# Patient Record
Sex: Female | Born: 1990 | Race: White | Hispanic: No | Marital: Single | Smoking: Current every day smoker
Health system: Southern US, Community
[De-identification: ages and names within clinical notes are randomized; demographics above are authoritative.]

## PROBLEM LIST (undated history)

## (undated) DIAGNOSIS — T8859XA Other complications of anesthesia, initial encounter: Secondary | ICD-10-CM

## (undated) DIAGNOSIS — F32A Depression, unspecified: Secondary | ICD-10-CM

## (undated) DIAGNOSIS — M5417 Radiculopathy, lumbosacral region: Secondary | ICD-10-CM

## (undated) DIAGNOSIS — N189 Chronic kidney disease, unspecified: Secondary | ICD-10-CM

## (undated) DIAGNOSIS — R569 Unspecified convulsions: Secondary | ICD-10-CM

## (undated) DIAGNOSIS — G43909 Migraine, unspecified, not intractable, without status migrainosus: Secondary | ICD-10-CM

## (undated) DIAGNOSIS — J189 Pneumonia, unspecified organism: Secondary | ICD-10-CM

## (undated) DIAGNOSIS — M5126 Other intervertebral disc displacement, lumbar region: Secondary | ICD-10-CM

## (undated) DIAGNOSIS — M797 Fibromyalgia: Secondary | ICD-10-CM

## (undated) DIAGNOSIS — I639 Cerebral infarction, unspecified: Secondary | ICD-10-CM

## (undated) DIAGNOSIS — M1990 Unspecified osteoarthritis, unspecified site: Secondary | ICD-10-CM

## (undated) DIAGNOSIS — F419 Anxiety disorder, unspecified: Secondary | ICD-10-CM

## (undated) DIAGNOSIS — J4540 Moderate persistent asthma, uncomplicated: Secondary | ICD-10-CM

## (undated) DIAGNOSIS — I10 Essential (primary) hypertension: Secondary | ICD-10-CM

## (undated) DIAGNOSIS — F4310 Post-traumatic stress disorder, unspecified: Secondary | ICD-10-CM

## (undated) DIAGNOSIS — G4730 Sleep apnea, unspecified: Secondary | ICD-10-CM

## (undated) DIAGNOSIS — K219 Gastro-esophageal reflux disease without esophagitis: Secondary | ICD-10-CM

## (undated) DIAGNOSIS — G459 Transient cerebral ischemic attack, unspecified: Secondary | ICD-10-CM

## (undated) HISTORY — PX: KIDNEY SURGERY: SHX687

## (undated) HISTORY — PX: OTHER SURGICAL HISTORY: SHX169

## (undated) HISTORY — PX: FOOT SURGERY: SHX648

## (undated) HISTORY — PX: WISDOM TOOTH EXTRACTION: SHX21

---
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
## 2010-08-08 ENCOUNTER — Emergency Department: Payer: Self-pay | Admitting: Emergency Medicine

## 2010-11-13 ENCOUNTER — Emergency Department: Payer: Self-pay | Admitting: Emergency Medicine

## 2011-09-22 DIAGNOSIS — I10 Essential (primary) hypertension: Secondary | ICD-10-CM | POA: Insufficient documentation

## 2012-08-02 ENCOUNTER — Emergency Department: Payer: Self-pay | Admitting: Emergency Medicine

## 2012-08-02 LAB — CBC
MCH: 26.3 pg (ref 26.0–34.0)
MCHC: 34.3 g/dL (ref 32.0–36.0)
MCV: 77 fL — ABNORMAL LOW (ref 80–100)
Platelet: 155 10*3/uL (ref 150–440)
RBC: 4.45 10*6/uL (ref 3.80–5.20)
RDW: 15.7 % — ABNORMAL HIGH (ref 11.5–14.5)
WBC: 3.3 10*3/uL — ABNORMAL LOW (ref 3.6–11.0)

## 2012-08-02 LAB — BASIC METABOLIC PANEL
Anion Gap: 9 (ref 7–16)
BUN: 7 mg/dL (ref 7–18)
Creatinine: 0.89 mg/dL (ref 0.60–1.30)
EGFR (African American): >60
EGFR (Non-African Amer.): >60
Glucose: 85 mg/dL (ref 65–99)

## 2012-08-02 LAB — WET PREP, GENITAL

## 2012-08-02 LAB — URINALYSIS, COMPLETE
Glucose,UR: NEGATIVE mg/dL (ref 0–75)
Ketone: NEGATIVE
Leukocyte Esterase: NEGATIVE
RBC,UR: 3 /HPF (ref 0–5)
Specific Gravity: 1.027 (ref 1.003–1.030)
Squamous Epithelial: 1

## 2012-08-02 LAB — PREGNANCY, URINE: Pregnancy Test, Urine: NEGATIVE m[IU]/mL

## 2012-08-08 LAB — CULTURE, BLOOD (SINGLE)

## 2013-05-19 ENCOUNTER — Emergency Department: Payer: Self-pay | Admitting: Emergency Medicine

## 2013-05-19 LAB — COMPREHENSIVE METABOLIC PANEL
Anion Gap: 4 — ABNORMAL LOW (ref 7–16)
BUN: 14 mg/dL (ref 7–18)
Calcium, Total: 8.8 mg/dL (ref 8.5–10.1)
Co2: 29 mmol/L (ref 21–32)
Creatinine: 0.91 mg/dL (ref 0.60–1.30)
EGFR (Non-African Amer.): >60
Osmolality: 277 (ref 275–301)
SGPT (ALT): 43 U/L (ref 12–78)
Sodium: 138 mmol/L (ref 136–145)

## 2013-05-19 LAB — CBC
HCT: 37.5 % (ref 35.0–47.0)
HGB: 12.5 g/dL (ref 12.0–16.0)
MCH: 26.8 pg (ref 26.0–34.0)
MCHC: 33.5 g/dL (ref 32.0–36.0)
Platelet: 205 10*3/uL (ref 150–440)
WBC: 7.7 10*3/uL (ref 3.6–11.0)

## 2014-01-16 ENCOUNTER — Emergency Department: Payer: Self-pay | Admitting: Emergency Medicine

## 2014-01-29 ENCOUNTER — Emergency Department: Payer: Self-pay | Admitting: Emergency Medicine

## 2014-03-14 ENCOUNTER — Emergency Department: Payer: Self-pay | Admitting: Emergency Medicine

## 2014-03-14 LAB — COMPREHENSIVE METABOLIC PANEL
ALT: 19 U/L (ref 12–78)
Albumin: 3.7 g/dL (ref 3.4–5.0)
Alkaline Phosphatase: 55 U/L
Anion Gap: 4 — ABNORMAL LOW (ref 7–16)
BUN: 11 mg/dL (ref 7–18)
Bilirubin,Total: 0.4 mg/dL (ref 0.2–1.0)
CHLORIDE: 107 mmol/L (ref 98–107)
Calcium, Total: 8.9 mg/dL (ref 8.5–10.1)
Co2: 29 mmol/L (ref 21–32)
Creatinine: 0.94 mg/dL (ref 0.60–1.30)
EGFR (Non-African Amer.): >60
Glucose: 77 mg/dL (ref 65–99)
Osmolality: 278 (ref 275–301)
POTASSIUM: 4 mmol/L (ref 3.5–5.1)
SGOT(AST): 11 U/L — ABNORMAL LOW (ref 15–37)
Sodium: 140 mmol/L (ref 136–145)
Total Protein: 7.2 g/dL (ref 6.4–8.2)

## 2014-03-14 LAB — CBC WITH DIFFERENTIAL/PLATELET
BASOS ABS: 0 10*3/uL (ref 0.0–0.1)
BASOS PCT: 0.3 %
Eosinophil #: 0.1 10*3/uL (ref 0.0–0.7)
Eosinophil %: 1.6 %
HCT: 38.3 % (ref 35.0–47.0)
HGB: 12.9 g/dL (ref 12.0–16.0)
Lymphocyte #: 2.3 10*3/uL (ref 1.0–3.6)
Lymphocyte %: 32.9 %
MCH: 29.5 pg (ref 26.0–34.0)
MCHC: 33.6 g/dL (ref 32.0–36.0)
MCV: 88 fL (ref 80–100)
MONO ABS: 0.6 x10 3/mm (ref 0.2–0.9)
MONOS PCT: 8 %
Neutrophil #: 4 10*3/uL (ref 1.4–6.5)
Neutrophil %: 57.2 %
Platelet: 188 10*3/uL (ref 150–440)
RBC: 4.36 10*6/uL (ref 3.80–5.20)
RDW: 14 % (ref 11.5–14.5)
WBC: 6.9 10*3/uL (ref 3.6–11.0)

## 2014-03-14 LAB — PROTIME-INR
INR: 0.9
PROTHROMBIN TIME: 12 s (ref 11.5–14.7)

## 2014-03-14 LAB — APTT: Activated PTT: 32.2 secs (ref 23.6–35.9)

## 2014-04-28 ENCOUNTER — Emergency Department: Payer: Self-pay | Admitting: Emergency Medicine

## 2014-04-28 LAB — URINALYSIS, COMPLETE
Bilirubin,UR: NEGATIVE
Glucose,UR: NEGATIVE mg/dL (ref 0–75)
Ketone: NEGATIVE
Nitrite: POSITIVE
Ph: 6 (ref 4.5–8.0)
Protein: NEGATIVE
RBC,UR: 5 /HPF (ref 0–5)
Specific Gravity: 1.013 (ref 1.003–1.030)
Squamous Epithelial: 6
WBC UR: 87 /HPF (ref 0–5)

## 2014-04-28 LAB — CBC WITH DIFFERENTIAL/PLATELET
Basophil #: 0 10*3/uL (ref 0.0–0.1)
Basophil %: 0.4 %
EOS ABS: 0.2 10*3/uL (ref 0.0–0.7)
EOS PCT: 3.1 %
HCT: 40.6 % (ref 35.0–47.0)
HGB: 13.8 g/dL (ref 12.0–16.0)
LYMPHS ABS: 2.1 10*3/uL (ref 1.0–3.6)
Lymphocyte %: 26.4 %
MCH: 29.5 pg (ref 26.0–34.0)
MCHC: 33.9 g/dL (ref 32.0–36.0)
MCV: 87 fL (ref 80–100)
Monocyte #: 0.5 x10 3/mm (ref 0.2–0.9)
Monocyte %: 6.4 %
NEUTROS ABS: 5 10*3/uL (ref 1.4–6.5)
NEUTROS PCT: 63.7 %
Platelet: 188 10*3/uL (ref 150–440)
RBC: 4.66 10*6/uL (ref 3.80–5.20)
RDW: 14 % (ref 11.5–14.5)
WBC: 7.9 10*3/uL (ref 3.6–11.0)

## 2014-04-28 LAB — BASIC METABOLIC PANEL
Anion Gap: 11 (ref 7–16)
BUN: 13 mg/dL (ref 7–18)
Calcium, Total: 9.3 mg/dL (ref 8.5–10.1)
Chloride: 107 mmol/L (ref 98–107)
Co2: 22 mmol/L (ref 21–32)
Creatinine: 0.84 mg/dL (ref 0.60–1.30)
EGFR (African American): >60
EGFR (Non-African Amer.): >60
Glucose: 108 mg/dL — ABNORMAL HIGH (ref 65–99)
Osmolality: 280 (ref 275–301)
Potassium: 4 mmol/L (ref 3.5–5.1)
Sodium: 140 mmol/L (ref 136–145)

## 2014-04-28 LAB — PREGNANCY, URINE: Pregnancy Test, Urine: NEGATIVE m[IU]/mL

## 2014-04-30 LAB — URINE CULTURE

## 2014-05-03 ENCOUNTER — Emergency Department: Payer: Self-pay | Admitting: Emergency Medicine

## 2014-05-03 LAB — COMPREHENSIVE METABOLIC PANEL
ALBUMIN: 3.7 g/dL (ref 3.4–5.0)
ALK PHOS: 46 U/L
Anion Gap: 5 — ABNORMAL LOW (ref 7–16)
BILIRUBIN TOTAL: 0.3 mg/dL (ref 0.2–1.0)
BUN: 11 mg/dL (ref 7–18)
CHLORIDE: 110 mmol/L — AB (ref 98–107)
CO2: 24 mmol/L (ref 21–32)
CREATININE: 1.11 mg/dL (ref 0.60–1.30)
Calcium, Total: 8.4 mg/dL — ABNORMAL LOW (ref 8.5–10.1)
Glucose: 104 mg/dL — ABNORMAL HIGH (ref 65–99)
OSMOLALITY: 277 (ref 275–301)
POTASSIUM: 4.3 mmol/L (ref 3.5–5.1)
SGOT(AST): 25 U/L (ref 15–37)
SGPT (ALT): 21 U/L (ref 12–78)
SODIUM: 139 mmol/L (ref 136–145)
TOTAL PROTEIN: 7.2 g/dL (ref 6.4–8.2)

## 2014-05-03 LAB — URINALYSIS, COMPLETE
BACTERIA: NONE SEEN
BILIRUBIN, UR: NEGATIVE
BLOOD: NEGATIVE
GLUCOSE, UR: NEGATIVE mg/dL (ref 0–75)
KETONE: NEGATIVE
LEUKOCYTE ESTERASE: NEGATIVE
NITRITE: NEGATIVE
PH: 6 (ref 4.5–8.0)
Protein: NEGATIVE
SPECIFIC GRAVITY: 1.02 (ref 1.003–1.030)
Squamous Epithelial: 1
WBC UR: 1 /HPF (ref 0–5)

## 2014-05-03 LAB — CBC
HCT: 38.5 % (ref 35.0–47.0)
HGB: 12.9 g/dL (ref 12.0–16.0)
MCH: 29.3 pg (ref 26.0–34.0)
MCHC: 33.6 g/dL (ref 32.0–36.0)
MCV: 87 fL (ref 80–100)
PLATELETS: 187 10*3/uL (ref 150–440)
RBC: 4.41 10*6/uL (ref 3.80–5.20)
RDW: 13.5 % (ref 11.5–14.5)
WBC: 5.1 10*3/uL (ref 3.6–11.0)

## 2014-05-03 LAB — PREGNANCY, URINE: PREGNANCY TEST, URINE: NEGATIVE m[IU]/mL

## 2014-05-05 LAB — URINE CULTURE

## 2014-07-16 ENCOUNTER — Emergency Department: Payer: Self-pay | Admitting: Emergency Medicine

## 2014-09-20 ENCOUNTER — Emergency Department: Payer: Self-pay | Admitting: Emergency Medicine

## 2014-09-22 ENCOUNTER — Emergency Department: Payer: Self-pay | Admitting: Emergency Medicine

## 2014-10-18 ENCOUNTER — Emergency Department: Payer: Self-pay | Admitting: Emergency Medicine

## 2014-12-23 ENCOUNTER — Emergency Department: Payer: Self-pay | Admitting: Emergency Medicine

## 2015-02-24 ENCOUNTER — Emergency Department: Payer: Self-pay | Admitting: Emergency Medicine

## 2015-06-13 IMAGING — CT CT STONE STUDY
2 of 4 series · 16 of 46 positions shown, 18 images · non-contrast
Comparison: US PELV - US TRANSVAGINAL dated 08/02/2012; CT STONE
STUDY dated 08/02/2012

CLINICAL DATA: One week ago of dysuria history of right nephrectomy
at age 2 years ; frequent urinary tract infections now with
worsening left lower back pain

EXAM:
CT ABDOMEN AND PELVIS WITHOUT CONTRAST
TECHNIQUE: Multidetector CT imaging of the abdomen and pelvis was performed
following the standard protocol without IV contrast.

[Series 2: stone standard full · axial · 0.90mm/px · z∈[-379,+91]mm · 13 of 104 slices shown, 15 images]
[im 5/104  soft-tissue]
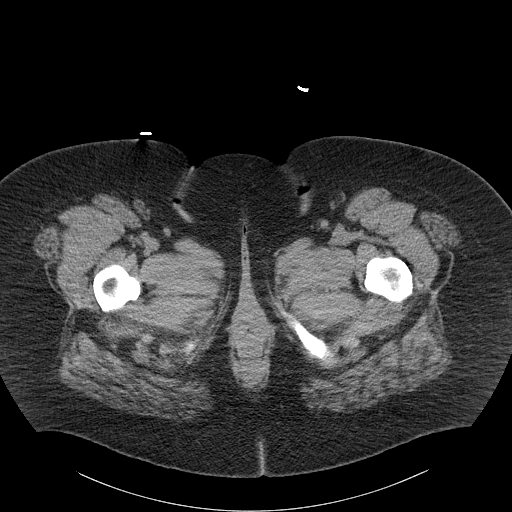
[im 5/104  bone]
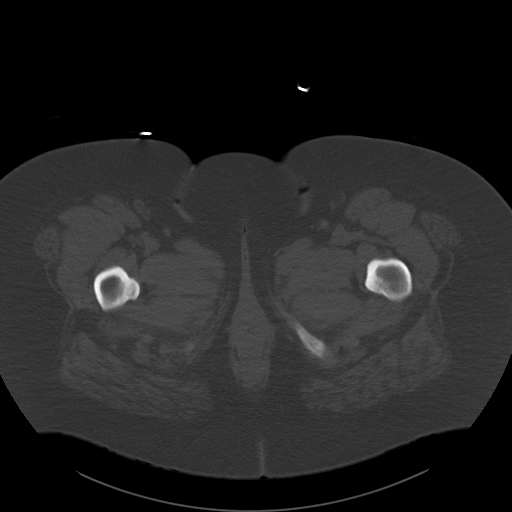
[im 13/104  soft-tissue]
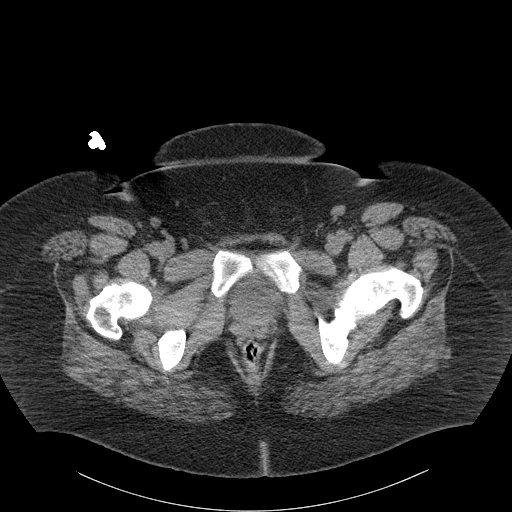
[im 22/104  soft-tissue]
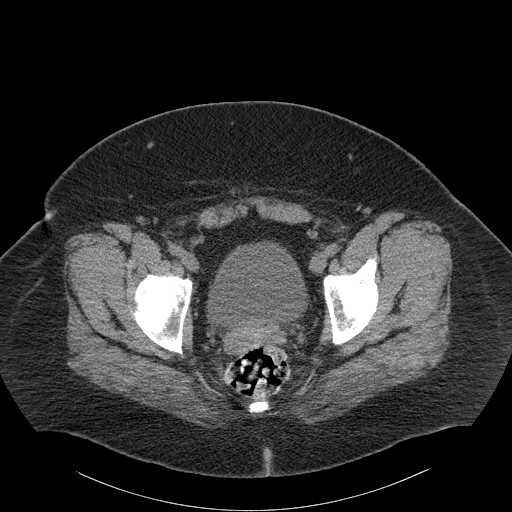
[im 31/104  soft-tissue]
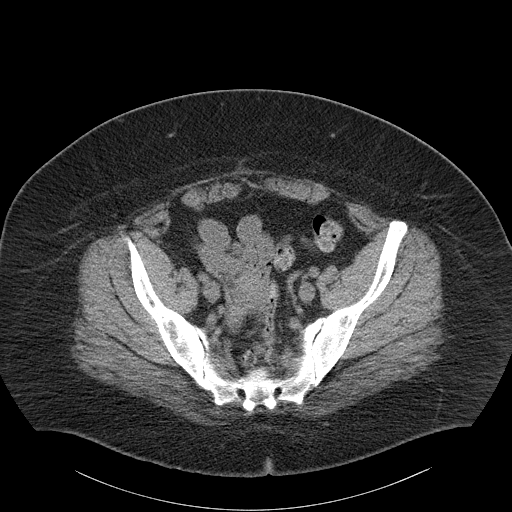
[im 35/104  soft-tissue]
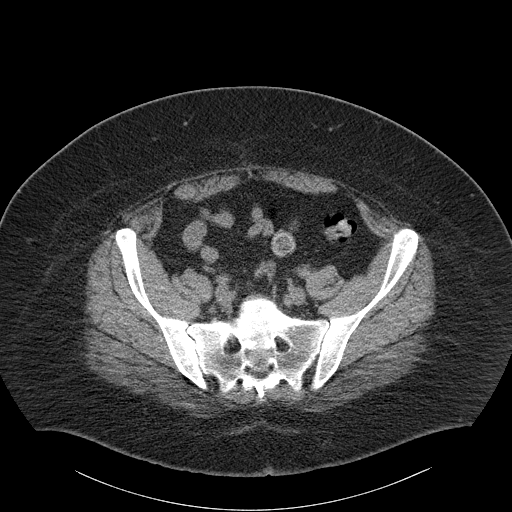
[im 43/104  soft-tissue]
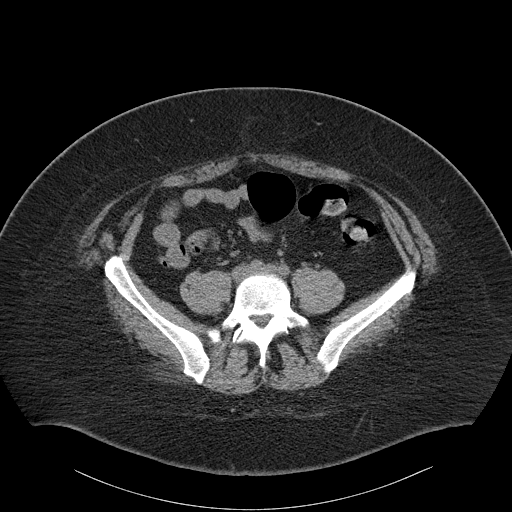
[im 52/104  soft-tissue]
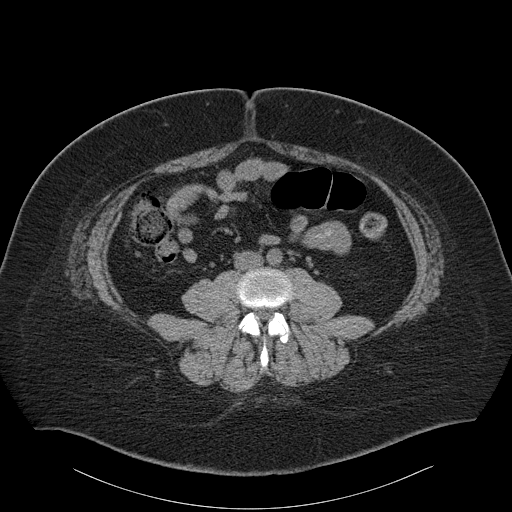
[im 61/104  soft-tissue]
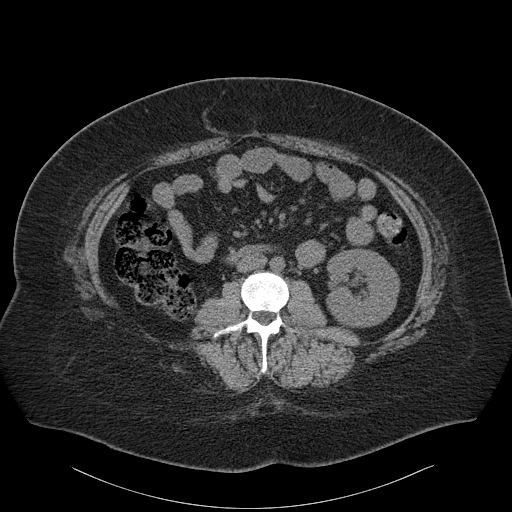
[im 69/104  soft-tissue]
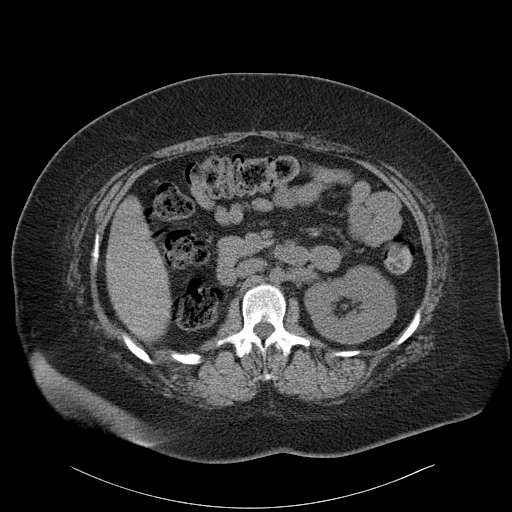
[im 69/104  bone]
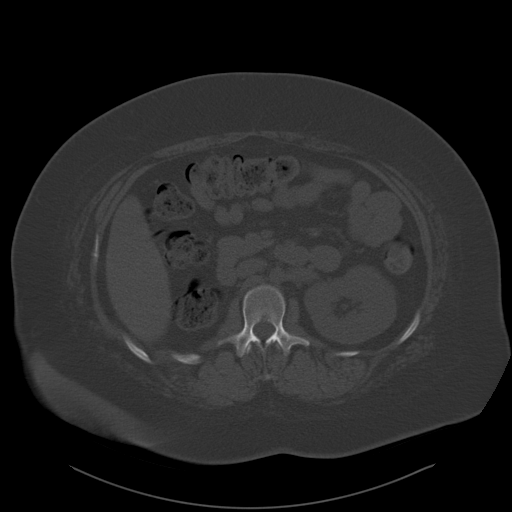
[im 73/104  soft-tissue]
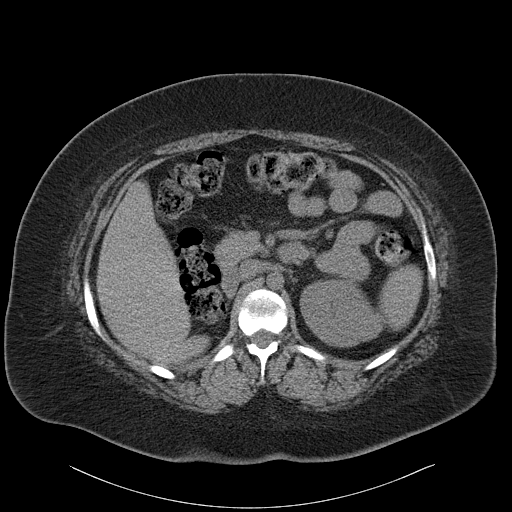
[im 82/104  soft-tissue]
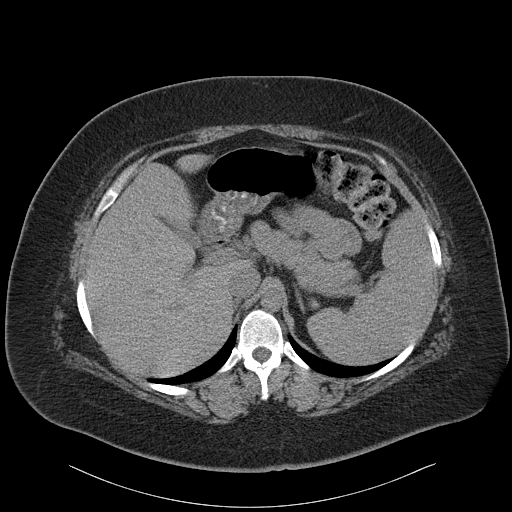
[im 91/104  soft-tissue]
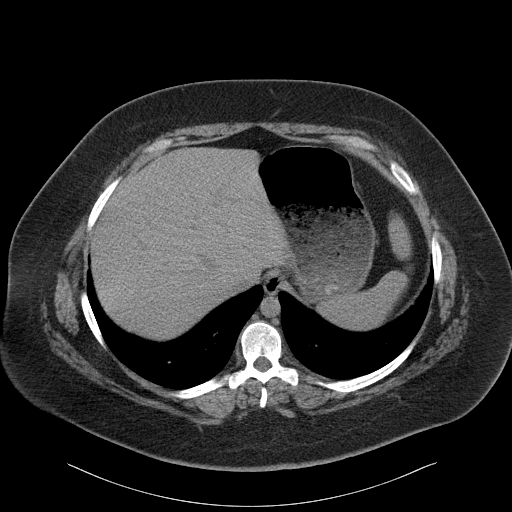
[im 99/104  soft-tissue]
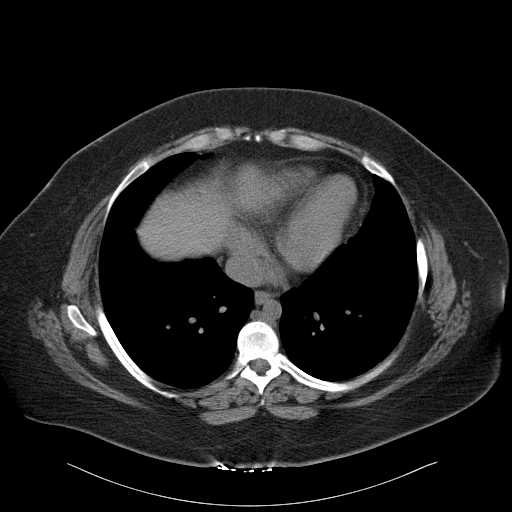

[Series 5: cor stone standard full · coronal · 0.94mm/px · 3 of 163 slices shown]
[im 55/163  soft-tissue]
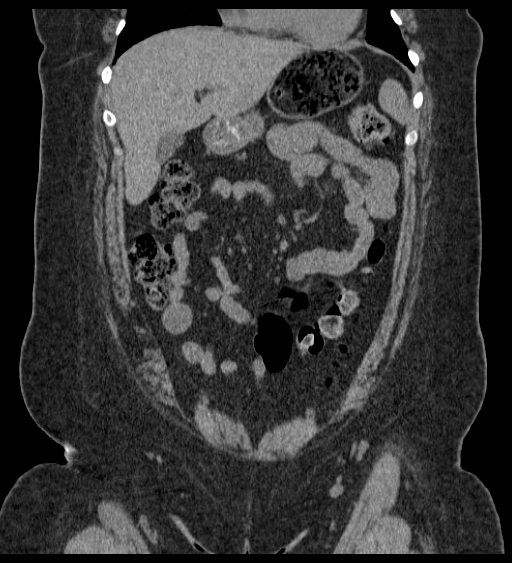
[im 73/163  soft-tissue]
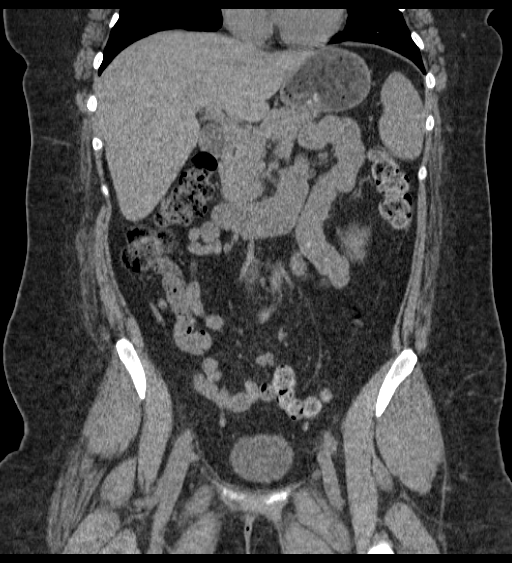
[im 91/163  soft-tissue]
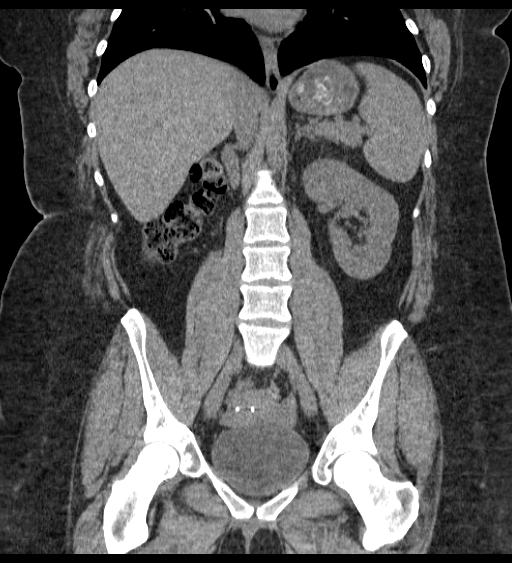

[16 of 46 positions shown; findings below may reference images not displayed]

FINDINGS: The right kidney is surgically absent. The left kidney is Normal in
density and contour. There is no hydronephrosis. There are no
calcified stones. The perinephric fat is normal in density. The left
ureter is normal in course and caliber. There is a stable phlebolith
near the urinary bladder base on the left on image 87 of series 2.
No bladder stone or urethral stone is demonstrated.

The liver, gallbladder, pancreas, and adrenal glands are normal in
appearance. The spleen has decreased in size since the previous
study with maximal AP dimension measured now of 14 cm while
previously it was approximately 16.7 cm. The caliber of the
abdominal aorta is normal.

The stomach is partially distended with food. The small and large
bowel exhibit no evidence of ileus nor obstruction. A normal
calibered uninflamed appearing appendix is demonstrated. Within the
pelvis an IUD is present within the uterus. There is no adnexal mass
or free pelvic fluid. There are shallow bilateral inguinal hernias
containing fat. There is no significant umbilical hernia

The psoas musculature is normal in appearance. These lumbar spine
and bony pelvis exhibit no acute abnormalities. There is bulging
disc material posteriorly at L5-S1. The lung bases are clear.
IMPRESSION: 1. There is no evidence of obstruction or other acute abnormality of
the solitary left kidney.
2. There is no acute bowel abnormality nor acute hepatobiliary
abnormality.
3. There is an IUD present. No acute uterine or adnexal abnormality
is demonstrated.
4. There is no evidence of ascites or intra-abdominal or pelvic
lymphadenopathy or inflammatory masses.

## 2015-08-29 ENCOUNTER — Other Ambulatory Visit: Payer: Self-pay

## 2015-08-29 ENCOUNTER — Emergency Department: Payer: Medicaid Other

## 2015-08-29 ENCOUNTER — Emergency Department
Admission: EM | Admit: 2015-08-29 | Discharge: 2015-08-29 | Disposition: A | Discharge status: Home / Self Care | Payer: Medicaid Other | Attending: Emergency Medicine | Admitting: Emergency Medicine

## 2015-08-29 ENCOUNTER — Encounter: Payer: Self-pay | Admitting: Emergency Medicine

## 2015-08-29 DIAGNOSIS — J189 Pneumonia, unspecified organism: Secondary | ICD-10-CM | POA: Diagnosis not present

## 2015-08-29 DIAGNOSIS — I10 Essential (primary) hypertension: Secondary | ICD-10-CM | POA: Insufficient documentation

## 2015-08-29 DIAGNOSIS — J069 Acute upper respiratory infection, unspecified: Secondary | ICD-10-CM | POA: Insufficient documentation

## 2015-08-29 DIAGNOSIS — Z79899 Other long term (current) drug therapy: Secondary | ICD-10-CM | POA: Diagnosis not present

## 2015-08-29 DIAGNOSIS — R042 Hemoptysis: Secondary | ICD-10-CM | POA: Diagnosis present

## 2015-08-29 HISTORY — DX: Essential (primary) hypertension: I10

## 2015-08-29 MED ORDER — ALBUTEROL SULFATE HFA 108 (90 BASE) MCG/ACT IN AERS: INHALATION_SPRAY | RESPIRATORY_TRACT | Status: DC

## 2015-08-29 MED ORDER — AZITHROMYCIN 250 MG PO TABS: ORAL_TABLET | ORAL | Status: DC

## 2015-08-29 MED ORDER — BENZONATATE 100 MG PO CAPS
200.0000 mg | ORAL_CAPSULE | Freq: Once | ORAL | Status: AC
  Administered 2015-08-29: 200 mg via ORAL

## 2015-08-29 MED ORDER — AZITHROMYCIN 250 MG PO TABS
ORAL_TABLET | ORAL | Status: AC
  Filled 2015-08-29: qty 4

## 2015-08-29 MED ORDER — AZITHROMYCIN 250 MG PO TABS
500.0000 mg | ORAL_TABLET | Freq: Once | ORAL | Status: AC
  Administered 2015-08-29: 500 mg via ORAL

## 2015-08-29 MED ORDER — BENZONATATE 100 MG PO CAPS
ORAL_CAPSULE | ORAL | Status: AC
  Filled 2015-08-29: qty 1

## 2015-08-29 MED ORDER — BENZONATATE 100 MG PO CAPS
100.0000 mg | ORAL_CAPSULE | Freq: Four times a day (QID) | ORAL | Status: AC | PRN
Start: 2015-08-29 — End: 2016-08-28

## 2015-08-29 NOTE — Discharge Instructions (Signed)
We believe that your symptoms are caused today by either a viral infection (a "cold"), an atypical pneumonia, or both.  Fortunately you should start to improve quickly with time and with the medications prescribed.  Please take the full course of antibiotics as prescribed and drink plenty of fluids.  Use the other prescribed medications as written and as needed.  Follow up with your doctor within 1-2 days.  If you develop any new or worsening symptoms, including but not limited to fever in spite of taking over-the-counter ibuprofen and/or Tylenol, persistent vomiting, worsening shortness of breath, or other symptoms that concern you, please return to the Emergency Department immediately.    Pneumonia, Adult Pneumonia is an infection of the lungs. It may be caused by a germ (virus or bacteria). Some types of pneumonia can spread easily from person to person. This can happen when you cough or sneeze. HOME CARE  Only take medicine as told by your doctor.  Take your medicine (antibiotics) as told. Finish it even if you start to feel better.  Do not smoke.  You may use a vaporizer or humidifier in your room. This can help loosen thick spit (mucus).  Sleep so you are almost sitting up (semi-upright). This helps reduce coughing.  Rest. A shot (vaccine) can help prevent pneumonia. Shots are often advised for:  People over 38 years old.  Patients on chemotherapy.  People with long-term (chronic) lung problems.  People with immune system problems. GET HELP RIGHT AWAY IF:   You are getting worse.  You cannot control your cough, and you are losing sleep.  You cough up blood.  Your pain gets worse, even with medicine.  You have a fever.  Any of your problems are getting worse, not better.  You have shortness of breath or chest pain. MAKE SURE YOU:   Understand these instructions.  Will watch your condition.  Will get help right away if you are not doing well or get  worse. Document Released: 06/03/2008 Document Revised: 03/09/2012 Document Reviewed: 03/08/2011 [facility] Patient Information 2015 [place], [place]. This information is not intended to replace advice given to you by your health care provider. Make sure you discuss any questions you have with your health care provider.  Upper Respiratory Infection, Adult An upper respiratory infection (URI) is also sometimes known as the common cold. The upper respiratory tract includes the nose, sinuses, throat, trachea, and bronchi. Bronchi are the airways leading to the lungs. Most people improve within 1 week, but symptoms can last up to 2 weeks. A residual cough may last even longer.  CAUSES Many different viruses can infect the tissues lining the upper respiratory tract. The tissues become irritated and inflamed and often become very moist. Mucus production is also common. A cold is contagious. You can easily spread the virus to others by oral contact. This includes kissing, sharing a glass, coughing, or sneezing. Touching your mouth or nose and then touching a surface, which is then touched by another person, can also spread the virus. SYMPTOMS  Symptoms typically develop 1 to 3 days after you come in contact with a cold virus. Symptoms vary from person to person. They may include:  Runny nose.  Sneezing.  Nasal congestion.  Sinus irritation.  Sore throat.  Loss of voice (laryngitis).  Cough.  Fatigue.  Muscle aches.  Loss of appetite.  Headache.  Low-grade fever. DIAGNOSIS  You might diagnose your own cold based on familiar symptoms, since most people get a cold 2  to 3 times a year. Your caregiver can confirm this based on your exam. Most importantly, your caregiver can check that your symptoms are not due to another disease such as strep throat, sinusitis, pneumonia, asthma, or epiglottitis. Blood tests, throat tests, and X-rays are not necessary to diagnose a common cold, but they may  sometimes be helpful in excluding other more serious diseases. Your caregiver will decide if any further tests are required. RISKS AND COMPLICATIONS  You may be at risk for a more severe case of the common cold if you smoke cigarettes, have chronic heart disease (such as heart failure) or lung disease (such as asthma), or if you have a weakened immune system. The very young and very old are also at risk for more serious infections. Bacterial sinusitis, middle ear infections, and bacterial pneumonia can complicate the common cold. The common cold can worsen asthma and chronic obstructive pulmonary disease (COPD). Sometimes, these complications can require emergency medical care and may be life-threatening. PREVENTION  The best way to protect against getting a cold is to practice good hygiene. Avoid oral or hand contact with people with cold symptoms. Wash your hands often if contact occurs. There is no clear evidence that vitamin C, vitamin E, echinacea, or exercise reduces the chance of developing a cold. However, it is always recommended to get plenty of rest and practice good nutrition. TREATMENT  Treatment is directed at relieving symptoms. There is no cure. Antibiotics are not effective, because the infection is caused by a virus, not by bacteria. Treatment may include:  Increased fluid intake. Sports drinks offer valuable electrolytes, sugars, and fluids.  Breathing heated mist or steam (vaporizer or shower).  Eating chicken soup or other clear broths, and maintaining good nutrition.  Getting plenty of rest.  Using gargles or lozenges for comfort.  Controlling fevers with ibuprofen or acetaminophen as directed by your caregiver.  Increasing usage of your inhaler if you have asthma. Zinc gel and zinc lozenges, taken in the first 24 hours of the common cold, can shorten the duration and lessen the severity of symptoms. Pain medicines may help with fever, muscle aches, and throat pain. A  variety of non-prescription medicines are available to treat congestion and runny nose. Your caregiver can make recommendations and may suggest nasal or lung inhalers for other symptoms.  HOME CARE INSTRUCTIONS   Only take over-the-counter or prescription medicines for pain, discomfort, or fever as directed by your caregiver.  Use a warm mist humidifier or inhale steam from a shower to increase air moisture. This may keep secretions moist and make it easier to breathe.  Drink enough water and fluids to keep your urine clear or pale yellow.  Rest as needed.  Return to work when your temperature has returned to normal or as your caregiver advises. You may need to stay home longer to avoid infecting others. You can also use a face mask and careful hand washing to prevent spread of the virus. SEEK MEDICAL CARE IF:   After the first few days, you feel you are getting worse rather than better.  You need your caregiver's advice about medicines to control symptoms.  You develop chills, worsening shortness of breath, or brown or red sputum. These may be signs of pneumonia.  You develop yellow or brown nasal discharge or pain in the face, especially when you bend forward. These may be signs of sinusitis.  You develop a fever, swollen neck glands, pain with swallowing, or white areas in the  back of your throat. These may be signs of strep throat. SEEK IMMEDIATE MEDICAL CARE IF:   You have a fever.  You develop severe or persistent headache, ear pain, sinus pain, or chest pain.  You develop wheezing, a prolonged cough, cough up blood, or have a change in your usual mucus (if you have chronic lung disease).  You develop sore muscles or a stiff neck. Document Released: 06/11/2001 Document Revised: 03/09/2012 Document Reviewed: 03/23/2014 [facility] Patient Information 2015 [place], [place]. This information is not intended to replace advice given to you by your health care provider. Make sure you  discuss any questions you have with your health care provider.

## 2015-08-29 NOTE — ED Provider Notes (Signed)
[facility] Emergency Department Provider Note  ____________________________________________  Time seen: Approximately 8:37 PM  I have reviewed the triage vital signs and the nursing notes.   HISTORY  Chief Complaint Hemoptysis    HPI Karen Hunt is a 24 y.o. female with a history of obesity and high blood pressure but no other significant medical problems who presents with about 3 days of upper respiratory symptoms including a sore throat, mild shortness of breath, frequent cough, and nasal congestion.  She has not felt any fever or chills, but the cough has been the worst of the symptoms.  She denies any wheezing or difficulty catching her breath but the shortness of breath she reports is mostly from the cough.  Today she was coughing hard enough that she noticed "flecks or blobs" of blood when she coughed.  Otherwise she notes that the cough is mostly just productive of "mostly clear" sputum.    She denies chest pain, abdominal pain, nausea/vomiting, dysuria.  Her voice has been softer than usual, but her mother who is at the bedside notes that whenever she gets a cold she loses her voice "at the drop of a hat".   Past Medical History  Diagnosis Date  . Hypertension     There are no active problems to display for this patient.   Past Surgical History  Procedure Laterality Date  . Kidney surgery    . Foot surgery Left   . C section      Current Outpatient Rx  Name  Route  Sig  Dispense  Refill  . lisinopril (PRINIVIL,ZESTRIL) 20 MG tablet   Oral   Take 20 mg by mouth daily.         Marland Kitchen albuterol (PROVENTIL HFA;VENTOLIN HFA) 108 (90 BASE) MCG/ACT inhaler      Inhale 4-6 puffs by mouth every 4 hours as needed for wheezing, cough, and/or shortness of breath   1 Inhaler   1   . azithromycin (ZITHROMAX) 250 MG tablet      Take 1 tablet PO daily for 4 more days starting tomorrow (08/30/2015)   4 tablet   0   . benzonatate (TESSALON PERLES)  100 MG capsule   Oral   Take 1 capsule (100 mg total) by mouth every 6 (six) hours as needed for cough.   30 capsule   0     Allergies Ciprofloxacin  History reviewed. No pertinent family history.  Social History Social History  Substance Use Topics  . Smoking status: Never Smoker   . Smokeless tobacco: None  . Alcohol Use: Yes    Review of Systems Constitutional: No fever/chills.  No myalgias, just generally not feeling well. Eyes: No visual changes. ENT: Mild sore throat Cardiovascular: Denies chest pain. Respiratory: Mild shortness of breath associated with a frequent cough and a small amount of hemoptysis. Gastrointestinal: No abdominal pain.  No nausea, no vomiting.  No diarrhea.  No constipation. Genitourinary: Negative for dysuria. Musculoskeletal: Negative for back pain. Skin: Negative for rash. Neurological: Negative for headaches, focal weakness or numbness.  10-point ROS otherwise negative.  ____________________________________________   PHYSICAL EXAM:  VITAL SIGNS: ED Triage Vitals  Enc Vitals Group     BP 08/29/15 1841 175/88 mmHg     Pulse Rate 08/29/15 1841 93     Resp 08/29/15 1841 22     Temp 08/29/15 1841 98.2 F (36.8 C)     Temp Source 08/29/15 1841 Oral     SpO2 --  Weight 08/29/15 1841 330 lb (149.687 kg)     Height 08/29/15 1841 5\' 11"  (1.803 m)     Head Cir --      Peak Flow --      Pain Score 08/29/15 1841 8     Pain Loc --      Pain Edu? --      Excl. in GC? --     Constitutional: Alert and oriented.  Nontoxic appearance but does appear uncomfortable. Eyes: Conjunctivae are normal. PERRL. EOMI. Head: Atraumatic. Nose: Mild congestion without rhinorrhea or epistaxis Mouth/Throat: Mucous membranes are moist.  Oropharynx non-erythematous. Neck: No stridor.  No tenderness to palpation of the larynx Cardiovascular: Normal rate, regular rhythm. Grossly normal heart sounds.  Good peripheral circulation.  Good capillary  refill. Respiratory: Mild tachypnea.  No retractions.  No accessory muscle usage.  Lungs CTAB without any wheezing, rales, or rhonchi.   Gastrointestinal: Obese, Soft and nontender. No distention. No abdominal bruits. No CVA tenderness. Musculoskeletal: No lower extremity tenderness nor edema.  No joint effusions. Neurologic:  Normal speech and language. No gross focal neurologic deficits are appreciated.  Skin:  Skin is warm, dry and intact. No rash noted. Psychiatric: Mood and affect are normal. Speech and behavior are normal.  ____________________________________________   LABS (all labs ordered are listed, but only abnormal results are displayed)  Did not obtain ____________________________________________  EKG  ED ECG REPORT I, Unique Searfoss, the attending physician, personally viewed and interpreted this ECG.  Date: 08/29/2015 EKG Time: 18:49 Rate: 79 Rhythm: normal sinus rhythm QRS Axis: normal Intervals: normal ST/T Wave abnormalities: normal Conduction Disutrbances: none Narrative Interpretation: unremarkable  ____________________________________________  RADIOLOGY   Dg Chest 2 View  08/29/2015   CLINICAL DATA:  Sore throat, shortness breath, coughing congestion for 2 days. Coughed up blood today. History of hypertension. Initial encounter.  EXAM: CHEST  2 VIEW  COMPARISON:  Abdominal CT 04/28/2014. No prior chest radiographs available.  FINDINGS: The heart size and mediastinal contours are normal. The lungs are clear. There is no pleural effusion or pneumothorax. No acute osseous findings are identified.  IMPRESSION: No active cardiopulmonary process.   Electronically Signed   By: Carey Bullocks M.D.   On: 08/29/2015 19:25    ____________________________________________   PROCEDURES  Procedure(s) performed: None  Critical Care performed: No ____________________________________________   INITIAL IMPRESSION / ASSESSMENT AND PLAN / ED COURSE  Pertinent  labs & imaging results that were available during my care of the patient were reviewed by me and considered in my medical decision making (see chart for details).  The patient has a well score for PE of 1 due to her hemoptysis.  I believe that, given all of her infectious symptoms, is much more likely that she is suffering from a viral illness and/or atypical pneumonia that is not showing up on her chest x-ray.  Her vital signs are stable other than very mild tachypnea and she has a normal oxygen saturation.  I discussed with her and her mom whether or not we should obtain labs, but I explained that I anticipated that she would have a elevated white blood cell count but that that would not tell us very much.  They understand and agree that lab work would not provide much additional information to the clinical picture.  We agreed that I would treat empirically with azithromycin and provided albuterol to help with her cough and for generally opening up her airways.  I also advised her to follow up  very closely with her outpatient doctor or to return immediately to the emergency department if she feels like she is getting worse.  She and her mother both understand and agree with the plan. ____________________________________________  FINAL CLINICAL IMPRESSION(S) / ED DIAGNOSES  Final diagnoses:  Atypical pneumonia  Upper respiratory infection      NEW MEDICATIONS STARTED DURING THIS VISIT:  New Prescriptions   ALBUTEROL (PROVENTIL HFA;VENTOLIN HFA) 108 (90 BASE) MCG/ACT INHALER    Inhale 4-6 puffs by mouth every 4 hours as needed for wheezing, cough, and/or shortness of breath   AZITHROMYCIN (ZITHROMAX) 250 MG TABLET    Take 1 tablet PO daily for 4 more days starting tomorrow (08/30/2015)   BENZONATATE (TESSALON PERLES) 100 MG CAPSULE    Take 1 capsule (100 mg total) by mouth every 6 (six) hours as needed for cough.     Loleta Rose, MD 08/29/15 2113

## 2015-08-29 NOTE — ED Notes (Signed)
Pt to ed with c/o sore throat, sob, cough and congestion since Sunday.  Denies fever, pt states today coughed up blood. sats at triage on RA 99%

## 2015-10-03 DIAGNOSIS — H66004 Acute suppurative otitis media without spontaneous rupture of ear drum, recurrent, right ear: Secondary | ICD-10-CM | POA: Insufficient documentation

## 2016-02-08 ENCOUNTER — Emergency Department
Admission: EM | Admit: 2016-02-08 | Discharge: 2016-02-08 | Disposition: A | Discharge status: Home / Self Care | Payer: Medicaid Other | Attending: Emergency Medicine | Admitting: Emergency Medicine

## 2016-02-08 ENCOUNTER — Emergency Department: Payer: Medicaid Other

## 2016-02-08 ENCOUNTER — Encounter: Payer: Self-pay | Admitting: *Deleted

## 2016-02-08 DIAGNOSIS — K219 Gastro-esophageal reflux disease without esophagitis: Secondary | ICD-10-CM | POA: Diagnosis not present

## 2016-02-08 DIAGNOSIS — I10 Essential (primary) hypertension: Secondary | ICD-10-CM | POA: Diagnosis not present

## 2016-02-08 DIAGNOSIS — Z3202 Encounter for pregnancy test, result negative: Secondary | ICD-10-CM | POA: Insufficient documentation

## 2016-02-08 DIAGNOSIS — R1013 Epigastric pain: Secondary | ICD-10-CM | POA: Diagnosis present

## 2016-02-08 LAB — COMPREHENSIVE METABOLIC PANEL
ALBUMIN: 4.4 g/dL (ref 3.5–5.0)
ALK PHOS: 37 U/L — AB (ref 38–126)
ALT: 20 U/L (ref 14–54)
AST: 18 U/L (ref 15–41)
Anion gap: 7 (ref 5–15)
BILIRUBIN TOTAL: 0.6 mg/dL (ref 0.3–1.2)
BUN: 13 mg/dL (ref 6–20)
CALCIUM: 9.5 mg/dL (ref 8.9–10.3)
CO2: 22 mmol/L (ref 22–32)
CREATININE: 0.97 mg/dL (ref 0.44–1.00)
Chloride: 109 mmol/L (ref 101–111)
GFR calc Af Amer: >60 mL/min (ref >60)
GFR calc non Af Amer: >60 mL/min (ref >60)
GLUCOSE: 96 mg/dL (ref 65–99)
Potassium: 4.1 mmol/L (ref 3.5–5.1)
Sodium: 138 mmol/L (ref 135–145)
TOTAL PROTEIN: 7.7 g/dL (ref 6.5–8.1)

## 2016-02-08 LAB — URINALYSIS COMPLETE WITH MICROSCOPIC (ARMC ONLY)
BACTERIA UA: NONE SEEN
Bilirubin Urine: NEGATIVE
Glucose, UA: NEGATIVE mg/dL
Hgb urine dipstick: NEGATIVE
KETONES UR: NEGATIVE mg/dL
Leukocytes, UA: NEGATIVE
NITRITE: NEGATIVE
PROTEIN: NEGATIVE mg/dL
Specific Gravity, Urine: 1.025 (ref 1.005–1.030)
pH: 5 (ref 5.0–8.0)

## 2016-02-08 LAB — CBC
HEMATOCRIT: 40.2 % (ref 35.0–47.0)
Hemoglobin: 13.7 g/dL (ref 12.0–16.0)
MCH: 28.5 pg (ref 26.0–34.0)
MCHC: 33.9 g/dL (ref 32.0–36.0)
MCV: 84.1 fL (ref 80.0–100.0)
PLATELETS: 209 10*3/uL (ref 150–440)
RBC: 4.79 MIL/uL (ref 3.80–5.20)
RDW: 13.7 % (ref 11.5–14.5)
WBC: 6.2 10*3/uL (ref 3.6–11.0)

## 2016-02-08 LAB — LIPASE, BLOOD: Lipase: 20 U/L (ref 11–51)

## 2016-02-08 LAB — PREGNANCY, URINE: PREG TEST UR: NEGATIVE

## 2016-02-08 MED ORDER — FAMOTIDINE 20 MG PO TABS
20.0000 mg | ORAL_TABLET | Freq: Once | ORAL | Status: AC
  Administered 2016-02-08: 20 mg via ORAL

## 2016-02-08 MED ORDER — OXYCODONE-ACETAMINOPHEN 5-325 MG PO TABS
2.0000 | ORAL_TABLET | Freq: Once | ORAL | Status: AC
  Administered 2016-02-08: 2 via ORAL

## 2016-02-08 MED ORDER — DIAZEPAM 5 MG PO TABS
ORAL_TABLET | ORAL | Status: AC
  Administered 2016-02-08: 5 mg via ORAL
  Filled 2016-02-08: qty 1

## 2016-02-08 MED ORDER — ONDANSETRON 4 MG PO TBDP
4.0000 mg | ORAL_TABLET | Freq: Once | ORAL | Status: AC
  Administered 2016-02-08: 4 mg via ORAL

## 2016-02-08 MED ORDER — DIAZEPAM 5 MG PO TABS
5.0000 mg | ORAL_TABLET | Freq: Once | ORAL | Status: AC
  Administered 2016-02-08: 5 mg via ORAL

## 2016-02-08 MED ORDER — GI COCKTAIL ~~LOC~~
ORAL | Status: AC
  Administered 2016-02-08: 30 mL via ORAL
  Filled 2016-02-08: qty 30

## 2016-02-08 MED ORDER — TRAMADOL HCL 50 MG PO TABS: 50.0000 mg | ORAL_TABLET | Freq: Four times a day (QID) | ORAL | Status: DC | PRN

## 2016-02-08 MED ORDER — FAMOTIDINE 20 MG PO TABS: 20.0000 mg | ORAL_TABLET | Freq: Two times a day (BID) | ORAL | Status: DC

## 2016-02-08 MED ORDER — LORAZEPAM 1 MG PO TABS: 1.0000 mg | ORAL_TABLET | Freq: Two times a day (BID) | ORAL | Status: DC

## 2016-02-08 MED ORDER — ONDANSETRON HCL 4 MG/2ML IJ SOLN: 4.0000 mg | Freq: Once | INTRAMUSCULAR | Status: DC

## 2016-02-08 MED ORDER — ONDANSETRON 4 MG PO TBDP
ORAL_TABLET | ORAL | Status: AC
  Administered 2016-02-08: 4 mg via ORAL
  Filled 2016-02-08: qty 1

## 2016-02-08 MED ORDER — FAMOTIDINE 20 MG PO TABS
ORAL_TABLET | ORAL | Status: AC
  Administered 2016-02-08: 20 mg via ORAL
  Filled 2016-02-08: qty 1

## 2016-02-08 MED ORDER — GI COCKTAIL ~~LOC~~
30.0000 mL | Freq: Once | ORAL | Status: AC
  Administered 2016-02-08: 30 mL via ORAL

## 2016-02-08 MED ORDER — OXYCODONE-ACETAMINOPHEN 5-325 MG PO TABS
ORAL_TABLET | ORAL | Status: AC
  Administered 2016-02-08: 2 via ORAL
  Filled 2016-02-08: qty 2

## 2016-02-08 MED ORDER — MORPHINE SULFATE (PF) 4 MG/ML IV SOLN: 4.0000 mg | Freq: Once | INTRAVENOUS | Status: DC

## 2016-02-08 NOTE — ED Provider Notes (Signed)
[facility] Emergency Department Provider Note     Time seen: ----------------------------------------- 9:03 PM on 02/08/2016 -----------------------------------------    I have reviewed the triage vital signs and the nursing notes.   HISTORY  Chief Complaint Abdominal Pain    HPI MALAI LADY is a 25 y.o. female who presents ER for epigastric pain this afternoon. Patient has not had this pain before, nothing makes it better or worse. Pain seems to be across the upper abdomen, no associated nausea vomiting or diarrhea. Patient is moving her bowels normally, does not have any persistent medical problems.   Past Medical History  Diagnosis Date  . Hypertension     There are no active problems to display for this patient.   Past Surgical History  Procedure Laterality Date  . Kidney surgery    . Foot surgery Left   . C section      Allergies Ciprofloxacin  Social History Social History  Substance Use Topics  . Smoking status: Never Smoker   . Smokeless tobacco: None  . Alcohol Use: Yes    Review of Systems Constitutional: Negative for fever. Eyes: Negative for visual changes. ENT: Negative for sore throat. Cardiovascular: Negative for chest pain. Respiratory: Negative for shortness of breath. Gastrointestinal: Positive for abdominal pain, negative for vomiting and diarrhea Genitourinary: Negative for dysuria. Musculoskeletal: Negative for back pain. Skin: Negative for rash. Neurological: Negative for headaches, focal weakness or numbness.  10-point ROS otherwise negative.  ____________________________________________   PHYSICAL EXAM:  VITAL SIGNS: ED Triage Vitals  Enc Vitals Group     BP 02/08/16 2007 134/79 mmHg     Pulse Rate 02/08/16 2007 73     Resp 02/08/16 2007 18     Temp 02/08/16 2007 97.8 F (36.6 C)     Temp Source 02/08/16 2007 Oral     SpO2 02/08/16 2007 98 %     Weight 02/08/16 2007 340 lb (154.223  kg)     Height 02/08/16 2007  (1.803 m)     Head Cir --      Peak Flow --      Pain Score 02/08/16 2007 9     Pain Loc --      Pain Edu? --      Excl. in GC? --     Constitutional: Alert and oriented. Mild distress Eyes: Conjunctivae are normal. PERRL. Normal extraocular movements. ENT   Head: Normocephalic and atraumatic.   Nose: No congestion/rhinnorhea.   Mouth/Throat: Mucous membranes are moist.   Neck: No stridor. Cardiovascular: Normal rate, regular rhythm. Normal and symmetric distal pulses are present in all extremities. No murmurs, rubs, or gallops. Respiratory: Normal respiratory effort without tachypnea nor retractions. Breath sounds are clear and equal bilaterally. No wheezes/rales/rhonchi. Gastrointestinal: Epigastric tenderness, no rebound or guarding. Normal bowel sounds. Musculoskeletal: Nontender with normal range of motion in all extremities. No joint effusions.  No lower extremity tenderness nor edema. Neurologic:  Normal speech and language. No gross focal neurologic deficits are appreciated. Speech is normal. No gait instability. Skin:  Skin is warm, dry and intact. No rash noted. Psychiatric: Mood and affect are normal. Speech and behavior are normal. Patient exhibits appropriate insight and judgment. ____________________________________________  ED COURSE:  Pertinent labs & imaging results that were available during my care of the patient were reviewed by me and considered in my medical decision making (see chart for details). Patient is no acute distress but does seem to be hurting particularly in the epigastrium.  I will check basic labs and x-rays. ____________________________________________    LABS (pertinent positives/negatives)  Labs Reviewed  COMPREHENSIVE METABOLIC PANEL - Abnormal; Notable for the following:    Alkaline Phosphatase 37 (*)    All other components within normal limits  URINALYSIS COMPLETEWITH MICROSCOPIC ([facility]  ONLY) - Abnormal; Notable for the following:    Color, Urine YELLOW (*)    APPearance CLEAR (*)    Squamous Epithelial / LPF 0-5 (*)    All other components within normal limits  LIPASE, BLOOD  CBC  PREGNANCY, URINE    RADIOLOGY Images were viewed by me  Abdomen 2 view Is unremarkable ____________________________________________  FINAL ASSESSMENT AND PLAN  Epigastric pain  Plan: Patient with labs and imaging as dictated above. Symptoms likely stress related which is worsening her GERD. She could also have an ulcer forming. I have started her on Pepcid, she'll continue on this and I will encourage close follow-up with her doctor for reevaluation.   Emily Filbert, MD   Emily Filbert, MD 02/08/16 [phone]

## 2016-02-08 NOTE — ED Notes (Signed)
Pt reports upper abd pain since this afternoon.  No n/v/d  Last bm was today.  No vag bleeding.  No back pain.  Pt alert.

## 2016-02-08 NOTE — ED Notes (Signed)
Xray made aware that pts urine preg was negative.

## 2016-02-08 NOTE — Discharge Instructions (Signed)

## 2016-03-28 ENCOUNTER — Emergency Department: Payer: Medicaid Other

## 2016-03-28 ENCOUNTER — Encounter: Payer: Self-pay | Admitting: Emergency Medicine

## 2016-03-28 ENCOUNTER — Emergency Department
Admission: EM | Admit: 2016-03-28 | Discharge: 2016-03-28 | Disposition: A | Discharge status: Home / Self Care | Payer: Medicaid Other | Attending: Emergency Medicine | Admitting: Emergency Medicine

## 2016-03-28 DIAGNOSIS — Z792 Long term (current) use of antibiotics: Secondary | ICD-10-CM | POA: Insufficient documentation

## 2016-03-28 DIAGNOSIS — Y929 Unspecified place or not applicable: Secondary | ICD-10-CM | POA: Insufficient documentation

## 2016-03-28 DIAGNOSIS — W2201XA Walked into wall, initial encounter: Secondary | ICD-10-CM | POA: Diagnosis not present

## 2016-03-28 DIAGNOSIS — Z79899 Other long term (current) drug therapy: Secondary | ICD-10-CM | POA: Diagnosis not present

## 2016-03-28 DIAGNOSIS — I10 Essential (primary) hypertension: Secondary | ICD-10-CM | POA: Diagnosis not present

## 2016-03-28 DIAGNOSIS — Y9389 Activity, other specified: Secondary | ICD-10-CM | POA: Insufficient documentation

## 2016-03-28 DIAGNOSIS — Y998 Other external cause status: Secondary | ICD-10-CM | POA: Insufficient documentation

## 2016-03-28 DIAGNOSIS — S60221A Contusion of right hand, initial encounter: Secondary | ICD-10-CM | POA: Diagnosis not present

## 2016-03-28 MED ORDER — TRAMADOL HCL 50 MG PO TABS: 50.0000 mg | ORAL_TABLET | Freq: Four times a day (QID) | ORAL | Status: DC | PRN

## 2016-03-28 NOTE — Discharge Instructions (Signed)
Hand Contusion  A hand contusion is a deep bruise on your hand area. Contusions are the result of an injury that caused bleeding under the skin. The contusion may turn blue, purple, or yellow. Minor injuries will give you a painless contusion, but more severe contusions may stay painful and swollen for a few weeks.  CAUSES   A contusion is usually caused by a blow, trauma, or direct force to an area of the body.  SYMPTOMS    Swelling and redness of the injured area.   Discoloration of the injured area.   Tenderness and soreness of the injured area.   Pain.  DIAGNOSIS   The diagnosis can be made by taking a history and performing a physical exam. An X-ray, CT scan, or MRI may be needed to determine if there were any associated injuries, such as broken bones (fractures).  TREATMENT   Often, the best treatment for a hand contusion is resting, elevating, icing, and applying cold compresses to the injured area. Over-the-counter medicines may also be recommended for pain control.  HOME CARE INSTRUCTIONS    Put ice on the injured area.    Put ice in a plastic bag.    Place a towel between your skin and the bag.    Leave the ice on for 15-20 minutes, 03-04 times a day.   Only take over-the-counter or prescription medicines as directed by your caregiver. Your caregiver may recommend avoiding anti-inflammatory medicines (aspirin, ibuprofen, and naproxen) for 48 hours because these medicines may increase bruising.   If told, use an elastic wrap as directed. This can help reduce swelling. You may remove the wrap for sleeping, showering, and bathing. If your fingers become numb, cold, or blue, take the wrap off and reapply it more loosely.   Elevate your hand with pillows to reduce swelling.   Avoid overusing your hand if it is painful.  SEEK IMMEDIATE MEDICAL CARE IF:    You have increased redness, swelling, or pain in your hand.   Your swelling or pain is not relieved with medicines.   You have loss of feeling in  your hand or are unable to move your fingers.   Your hand turns cold or blue.   You have pain when you move your fingers.   Your hand becomes warm to the touch.   Your contusion does not improve in 2 days.  MAKE SURE YOU:    Understand these instructions.   Will watch your condition.   Will get help right away if you are not doing well or get worse.     This information is not intended to replace advice given to you by your health care provider. Make sure you discuss any questions you have with your health care provider.     Document Released: 06/07/2002 Document Revised: 09/09/2012 Document Reviewed: 06/08/2012  Elsevier Interactive Patient Education 2016 [employer]

## 2016-03-28 NOTE — ED Notes (Signed)
Patient discharged to home per MD order. Patient in stable condition, and deemed medically cleared by ED provider for discharge. Discharge instructions reviewed with patient/family using "Teach Back"; verbalized understanding of medication education and administration, and information about follow-up care. Denies further concerns. ° °

## 2016-03-28 NOTE — ED Provider Notes (Signed)
[facility] Emergency Department Provider Note  ____________________________________________  Time seen: Approximately 8:03 PM  I have reviewed the triage vital signs and the nursing notes.   HISTORY  Chief Complaint Hand Injury    HPI Karen Hunt is a 25 y.o. female who presents emergency department complaining of right hand pain. Patient states that she became angry and punched the wall with). Patient is now having pain to the fifth metacarpal. Patient does endorse a small abrasion and swelling to the area as well. Patient denies any pain to wrist or elbow. She denies any nose or tingling in her digits. No other injury or complaint at this time.   Past Medical History  Diagnosis Date  . Hypertension     There are no active problems to display for this patient.   Past Surgical History  Procedure Laterality Date  . Kidney surgery    . Foot surgery Left   . C section      Current Outpatient Rx  Name  Route  Sig  Dispense  Refill  . albuterol (PROVENTIL HFA;VENTOLIN HFA) 108 (90 BASE) MCG/ACT inhaler      Inhale 4-6 puffs by mouth every 4 hours as needed for wheezing, cough, and/or shortness of breath   1 Inhaler   1   . azithromycin (ZITHROMAX) 250 MG tablet      Take 1 tablet PO daily for 4 more days starting tomorrow (08/30/2015)   4 tablet   0   . benzonatate (TESSALON PERLES) 100 MG capsule   Oral   Take 1 capsule (100 mg total) by mouth every 6 (six) hours as needed for cough.   30 capsule   0   . famotidine (PEPCID) 20 MG tablet   Oral   Take 1 tablet (20 mg total) by mouth 2 (two) times daily.   60 tablet   1   . lisinopril (PRINIVIL,ZESTRIL) 20 MG tablet   Oral   Take 20 mg by mouth daily.         Marland Kitchen. LORazepam (ATIVAN) 1 MG tablet   Oral   Take 1 tablet (1 mg total) by mouth 2 (two) times daily.   20 tablet   0   . traMADol (ULTRAM) 50 MG tablet   Oral   Take 1 tablet (50 mg total) by mouth every 6 (six)  hours as needed.   10 tablet   0     Allergies Ciprofloxacin  No family history on file.  Social History Social History  Substance Use Topics  . Smoking status: Never Smoker   . Smokeless tobacco: None  . Alcohol Use: Yes     Review of Systems  Constitutional: No fever/chills Cardiovascular: no chest pain. Respiratory: no cough. No SOB. Musculoskeletal: Positive for right hand pain. Skin: Negative for rash. Positive for abrasion to the right hand. Neurological: Negative for headaches, focal weakness or numbness. 10-point ROS otherwise negative.  ____________________________________________   PHYSICAL EXAM:  VITAL SIGNS: ED Triage Vitals  Enc Vitals Group     BP 03/28/16 1928 160/99 mmHg     Pulse Rate 03/28/16 1928 73     Resp 03/28/16 1928 18     Temp 03/28/16 1928 97.6 F (36.4 C)     Temp Source 03/28/16 1928 Oral     SpO2 03/28/16 1928 98 %     Weight 03/28/16 1928 350 lb (158.759 kg)     Height 03/28/16 1928 5\' 10"  (1.778 m)  Head Cir --      Peak Flow --      Pain Score 03/28/16 1929 7     Pain Loc --      Pain Edu? --      Excl. in GC? --      Constitutional: Alert and oriented. Well appearing and in no acute distress. Cardiovascular: Normal rate, regular rhythm. Normal S1 and S2.  Good peripheral circulation. Respiratory: Normal respiratory effort without tachypnea or retractions. Lungs CTAB. Musculoskeletal: Edema noted over the fifth metacarpal bone of the right hand. Patient is very tender to palpation. No visible deformity. No palpable abnormality. Range of motion to all digits of the right hand. Sensation intact distally. Cap refill intact 5 digits. Neurologic:  Normal speech and language. No gross focal neurologic deficits are appreciated.  Skin:  Skin is warm, dry and intact. No rash noted. Psychiatric: Mood and affect are normal. Speech and behavior are normal. Patient exhibits appropriate insight and  judgement.   ____________________________________________   LABS (all labs ordered are listed, but only abnormal results are displayed)  Labs Reviewed - No data to display ____________________________________________  EKG   ____________________________________________  RADIOLOGY Festus Barren Cuthriell, personally viewed and evaluated these images (plain radiographs) as part of my medical decision making, as well as reviewing the written report by the radiologist.  Dg Hand Complete Right  03/28/2016  CLINICAL DATA:  Ulnar-sided right hand pain. Swelling and bruising after punching a wall last night. EXAM: RIGHT HAND - COMPLETE 3+ VIEW COMPARISON:  Right wrist plain film dated 01/16/2014. FINDINGS: Osseous alignment is normal. Bone mineralization is normal. No fracture line or displaced fracture fragment identified. Soft tissue swelling over the right fifth metacarpal bone. IMPRESSION: No osseous fracture or dislocation. Soft tissue swelling overlying the fifth metacarpal bone. Electronically Signed   By: Bary Richard M.D.   On: 03/28/2016 19:47    ____________________________________________    PROCEDURES  Procedure(s) performed:       Medications - No data to display   ____________________________________________   INITIAL IMPRESSION / ASSESSMENT AND PLAN / ED COURSE  Pertinent labs & imaging results that were available during my care of the patient were reviewed by me and considered in my medical decision making (see chart for details).  Patient's diagnosis is consistent with right hand contusion. Patient is given a splint here in the emergency department for symptom control.. Patient will be discharged home with prescriptions for limited amount of pain medication. Patient is to follow up with orthopedics if symptoms persist past this treatment course. Patient is given ED precautions to return to the ED for any worsening or new  symptoms.     ____________________________________________  FINAL CLINICAL IMPRESSION(S) / ED DIAGNOSES  Final diagnoses:  Hand contusion, right, initial encounter      NEW MEDICATIONS STARTED DURING THIS VISIT:  Discharge Medication List as of 03/28/2016  8:05 PM          This chart was dictated using voice recognition software/Dragon. Despite best efforts to proofread, errors can occur which can change the meaning. Any change was purely unintentional.    Racheal Patches, PA-C 03/28/16 2041  Jene Every, MD 03/28/16 [phone]

## 2016-03-28 NOTE — ED Notes (Signed)
Punched wall with right hand.  C/o pain to right hand

## 2016-10-01 ENCOUNTER — Encounter: Payer: Self-pay | Admitting: Emergency Medicine

## 2016-10-01 ENCOUNTER — Ambulatory Visit
Admission: EM | Admit: 2016-10-01 | Discharge: 2016-10-01 | Disposition: A | Discharge status: Home / Self Care | Payer: Medicaid Other | Attending: Family Medicine | Admitting: Family Medicine

## 2016-10-01 DIAGNOSIS — J019 Acute sinusitis, unspecified: Secondary | ICD-10-CM

## 2016-10-01 DIAGNOSIS — H6504 Acute serous otitis media, recurrent, right ear: Secondary | ICD-10-CM

## 2016-10-01 DIAGNOSIS — J029 Acute pharyngitis, unspecified: Secondary | ICD-10-CM | POA: Insufficient documentation

## 2016-10-01 LAB — RAPID STREP SCREEN (MED CTR MEBANE ONLY): Streptococcus, Group A Screen (Direct): NEGATIVE

## 2016-10-01 MED ORDER — FEXOFENADINE-PSEUDOEPHED ER 60-120 MG PO TB12: 1.0000 | ORAL_TABLET | Freq: Two times a day (BID) | ORAL | 0 refills | Status: DC

## 2016-10-01 MED ORDER — FLUTICASONE PROPIONATE 50 MCG/ACT NA SUSP: 2.0000 | Freq: Every day | NASAL | 0 refills | Status: DC

## 2016-10-01 MED ORDER — AMOXICILLIN-POT CLAVULANATE 875-125 MG PO TABS: 1.0000 | ORAL_TABLET | Freq: Two times a day (BID) | ORAL | 0 refills | Status: DC

## 2016-10-01 NOTE — ED Triage Notes (Signed)
Patient c/o ear pain in both ears, cough, chest congestion and sore throat for a week.

## 2016-10-01 NOTE — ED Provider Notes (Signed)
[facility]    CSN: [id number] Arrival date & time: 10/01/16  1840     History   Chief Complaint Chief Complaint  Patient presents with  . Otalgia  . Sore Throat  . Cough    HPI Karen Hunt is a 25 y.o. female.   Patient reports having a sore throat started yesterday appears significant other was diagnosed strep. Before then though she's been complaining of ear pain that started last Wednesday is now 7 days ago. The pain has been progressing. She does not have any history of fever states that she's had multiple ear surgeries because of recurrent ear infections. She states that when this is this bad shet requires an  antibiotic. She feels that the ears are infected.Right ear is worse and she has pain around the right side of the jaws well. She unfortunately smokes. Mother has sarcoidosis.she is allergic to Cipro. She's had kidney surgery as well as multiple ear surgeries before the past.    The history is provided by the patient. No language interpreter was used.  Otalgia  Location:  Right Quality:  Aching and pressure Severity:  Moderate Duration:  7 days Timing:  Constant Progression:  Worsening Chronicity:  Recurrent Relieved by:  Nothing Worsened by:  Nothing Associated symptoms: cough, headaches, rhinorrhea and sore throat   Associated symptoms: no congestion, no ear discharge, no neck pain and no vomiting   Risk factors: chronic ear infection and prior ear surgery   Sore Throat  This is a new problem. The current episode started yesterday. Associated symptoms include headaches.  Cough  Associated symptoms: ear pain, headaches, rhinorrhea and sore throat     Past Medical History:  Diagnosis Date  . Hypertension     There are no active problems to display for this patient.   Past Surgical History:  Procedure Laterality Date  . c section    . FOOT SURGERY Left   . KIDNEY SURGERY      OB History    Gravida Para Term Preterm AB Living   2        1 1    SAB TAB Ectopic Multiple Live Births   1               Home Medications    Prior to Admission medications   Medication Sig Start Date End Date Taking? Authorizing Provider  levonorgestrel (MIRENA) 20 MCG/24HR IUD 1 each by Intrauterine route once.   Yes Historical Provider, MD  albuterol (PROVENTIL HFA;VENTOLIN HFA) 108 (90 BASE) MCG/ACT inhaler Inhale 4-6 puffs by mouth every 4 hours as needed for wheezing, cough, and/or shortness of breath 08/29/15   Loleta Rose, MD  amoxicillin-clavulanate (AUGMENTIN) 875-125 MG tablet Take 1 tablet by mouth 2 (two) times daily. 10/01/16   Hassan Rowan, MD  famotidine (PEPCID) 20 MG tablet Take 1 tablet (20 mg total) by mouth 2 (two) times daily. 02/08/16 02/07/17  Emily Filbert, MD  fexofenadine-pseudoephedrine (ALLEGRA-D) 60-120 MG 12 hr tablet Take 1 tablet by mouth every 12 (twelve) hours. 10/01/16   Hassan Rowan, MD  fluticasone (FLONASE) 50 MCG/ACT nasal spray Place 2 sprays into both nostrils daily. 10/01/16   Hassan Rowan, MD    Family History History reviewed. No pertinent family history.  Social History Social History  Substance Use Topics  . Smoking status: Current Every Day Smoker    Packs/day: 1.00    Types: Cigarettes  . Smokeless tobacco: Not on file  . Alcohol use Yes  Allergies   Ciprofloxacin   Review of Systems Review of Systems  HENT: Positive for ear pain, rhinorrhea and sore throat. Negative for congestion and ear discharge.   Respiratory: Positive for cough.   Gastrointestinal: Negative for vomiting.  Musculoskeletal: Negative for neck pain.  Neurological: Positive for headaches.  All other systems reviewed and are negative.    Physical Exam Triage Vital Signs ED Triage Vitals  Enc Vitals Group     BP 10/01/16 1854 (!) 146/80     Pulse Rate 10/01/16 1854 88     Resp 10/01/16 1854 16     Temp 10/01/16 1854 97.5 F (36.4 C)     Temp Source 10/01/16 1854 Tympanic     SpO2 10/01/16 1854 100 %       Weight 10/01/16 1852 (!) 340 lb (154.2 kg)     Height 10/01/16 1852 5\' 11"  (1.803 m)     Head Circumference --      Peak Flow --      Pain Score 10/01/16 1855 7     Pain Loc --      Pain Edu? --      Excl. in GC? --    No data found.   Updated Vital Signs BP (!) 146/80 (BP Location: Left Arm)   Pulse 88   Temp 97.5 F (36.4 C) (Tympanic)   Resp 16   Ht 5\' 11"  (1.803 m)   Wt (!) 340 lb (154.2 kg)   SpO2 100%   BMI 47.42 kg/m   Visual Acuity Right Eye Distance:   Left Eye Distance:   Bilateral Distance:    Right Eye Near:   Left Eye Near:    Bilateral Near:     Physical Exam  Constitutional: She is oriented to person, place, and time. She appears well-developed. No distress.  HENT:  Head: Normocephalic and atraumatic.  Right Ear: Tympanic membrane is scarred and bulging.  Left Ear: Tympanic membrane is scarred and bulging.  Nose: Mucosal edema and rhinorrhea present. Right sinus exhibits maxillary sinus tenderness. Left sinus exhibits maxillary sinus tenderness.  Mouth/Throat: Uvula is midline. Posterior oropharyngeal erythema present.  Eyes: Conjunctivae are normal. Pupils are equal, round, and reactive to light. Right eye exhibits no discharge.  Neck: Normal range of motion. Neck supple. No tracheal deviation present.  Cardiovascular: Normal rate.   Pulmonary/Chest: Effort normal and breath sounds normal.  Musculoskeletal: Normal range of motion. She exhibits no edema.  Neurological: She is alert and oriented to person, place, and time.  Skin: Skin is warm and dry. She is not diaphoretic.  Psychiatric: She has a normal mood and affect.     UC Treatments / Results  Labs (all labs ordered are listed, but only abnormal results are displayed) Labs Reviewed  RAPID STREP [facility]CREEN (NOT [facility])  CULTURE, GROUP A STREP [facility])    EKG  EKG Interpretation None       Radiology No results found.  Procedures Procedures (including critical care  time)  Medications Ordered in UC Medications - No data to display   Initial Impression / Assessment and Plan / UC Course  I have reviewed the triage vital signs and the nursing notes.  Pertinent labs & imaging results that were available during my care of the patient were reviewed by me and considered in my medical decision making (see chart for details).  Results for orders placed or performed during the hospital encounter of 10/01/16  Rapid strep screen  Result Value Ref Range  Streptococcus, Group A Screen (Direct) NEGATIVE NEGATIVE   Clinical Course   Patient was informed strep test was negative. Because of her history of recurrent ear infections going treat her as if she has an acute ear infection splint to her and her mother mild scarring of the eardrum is difficult to state whether or not she does have ear infection since she does have a tenderness in the lease eustachian tube dysfunction and her symptoms were going on for 8 days will treat as this is a active infection.   Final Clinical Impressions(s) / UC Diagnoses   Final diagnoses:  Recurrent acute serous otitis media of right ear  Pharyngitis, unspecified etiology  Acute non-recurrent sinusitis, unspecified location    New Prescriptions New Prescriptions   AMOXICILLIN-CLAVULANATE (AUGMENTIN) 875-125 MG TABLET    Take 1 tablet by mouth 2 (two) times daily.   FEXOFENADINE-PSEUDOEPHEDRINE (ALLEGRA-D) 60-120 MG 12 HR TABLET    Take 1 tablet by mouth every 12 (twelve) hours.   FLUTICASONE (FLONASE) 50 MCG/ACT NASAL SPRAY    Place 2 sprays into both nostrils daily.     Hassan Rowan, MD 10/01/16 1946

## 2016-10-03 LAB — CULTURE, GROUP A STREP (THRC)

## 2017-01-18 ENCOUNTER — Ambulatory Visit
Admission: EM | Admit: 2017-01-18 | Discharge: 2017-01-18 | Disposition: A | Discharge status: Home / Self Care | Payer: Medicaid Other | Attending: Family Medicine | Admitting: Family Medicine

## 2017-01-18 ENCOUNTER — Ambulatory Visit: Payer: Self-pay

## 2017-01-18 ENCOUNTER — Encounter: Payer: Self-pay | Admitting: Emergency Medicine

## 2017-01-18 DIAGNOSIS — S8001XA Contusion of right knee, initial encounter: Secondary | ICD-10-CM | POA: Insufficient documentation

## 2017-01-18 DIAGNOSIS — W000XXA Fall on same level due to ice and snow, initial encounter: Secondary | ICD-10-CM | POA: Insufficient documentation

## 2017-01-18 DIAGNOSIS — M25561 Pain in right knee: Secondary | ICD-10-CM | POA: Insufficient documentation

## 2017-01-18 MED ORDER — MELOXICAM 7.5 MG PO TABS: 7.5000 mg | ORAL_TABLET | Freq: Every day | ORAL | 0 refills | Status: DC

## 2017-01-18 MED ORDER — KETOROLAC TROMETHAMINE 60 MG/2ML IM SOLN
60.0000 mg | Freq: Once | INTRAMUSCULAR | Status: AC
  Administered 2017-01-18: 60 mg via INTRAMUSCULAR

## 2017-01-18 NOTE — ED Triage Notes (Signed)
Patient c/o right knee pain after falling on ice last night.

## 2017-01-18 NOTE — ED Provider Notes (Signed)
[facility]    CSN: [id number] Arrival date & time: 01/18/17  1317     History   Chief Complaint Chief Complaint  Patient presents with  . Knee Pain    HPI Karen Hunt is a 26 y.o. female.   Patient reports falling last night on the ice but she is trying get her car. She landed on her right knee. She reports significant mild pain and discomfort in the right knee which is only gotten worse as the night progressed. 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 According to her she has good kidney function at this time. She reports swelling of the right knee and difficulty in relating on the right knee. She is brought in by grandmother today to be evaluated and seen for her right knee pain. Unfortunately. Her father had liver cancer and she is allergic to ciprofloxacin.    The history is provided by the patient and a relative. No language interpreter was used.  Knee Pain  Location:  Knee Time since incident:  1 day Injury: yes   Knee location:  R knee Pain details:    Quality:  Throbbing and sharp   Radiates to:  Does not radiate   Severity:  Moderate   Onset quality:  Sudden   Duration:  1 day   Timing:  Constant   Progression:  Worsening Chronicity:  New Dislocation: no   Foreign body present:  No foreign bodies Prior injury to area:  No Relieved by:  Nothing Worsened by:  Nothing Ineffective treatments:  None tried   Past Medical History:  Diagnosis Date  . Hypertension     There are no active problems to display for this patient.   Past Surgical History:  Procedure Laterality Date  . c section    . FOOT SURGERY Left   . KIDNEY SURGERY      OB History    Gravida Para Term Preterm AB Living   2       1 1    SAB TAB Ectopic Multiple Live Births   1               Home Medications    Prior to Admission medications     Medication Sig Start Date End Date Taking? Authorizing Provider  albuterol (PROVENTIL HFA;VENTOLIN HFA) 108 (90 BASE) MCG/ACT inhaler Inhale 4-6 puffs by mouth every 4 hours as needed for wheezing, cough, and/or shortness of breath 08/29/15   Loleta Rose, MD  famotidine (PEPCID) 20 MG tablet Take 1 tablet (20 mg total) by mouth 2 (two) times daily. 02/08/16 02/07/17  Emily Filbert, MD  fexofenadine-pseudoephedrine (ALLEGRA-D) 60-120 MG 12 hr tablet Take 1 tablet by mouth every 12 (twelve) hours. 10/01/16   Hassan Rowan, MD  fluticasone (FLONASE) 50 MCG/ACT nasal spray Place 2 sprays into both nostrils daily. 10/01/16   Hassan Rowan, MD  levonorgestrel (MIRENA) 20 MCG/24HR IUD 1 each by Intrauterine route once.    Historical Provider, MD  meloxicam (MOBIC) 7.5 MG tablet Take 1 tablet (7.5 mg total) by mouth daily. 01/18/17   Hassan Rowan, MD    Family History History reviewed. No pertinent family history.  Social History Social History  Substance Use Topics  . Smoking status: Current Every Day Smoker    Packs/day: 1.00    Types: Cigarettes  . Smokeless tobacco: Never Used  . Alcohol  use Yes     Allergies   Ciprofloxacin   Review of Systems Review of Systems  Musculoskeletal: Positive for gait problem, joint swelling and myalgias.  All other systems reviewed and are negative.    Physical Exam Triage Vital Signs ED Triage Vitals  Enc Vitals Group     BP 01/18/17 1414 135/83     Pulse Rate 01/18/17 1414 78     Resp 01/18/17 1414 16     Temp 01/18/17 1414 98.1 F (36.7 C)     Temp Source 01/18/17 1414 Oral     SpO2 01/18/17 1414 99 %     Weight 01/18/17 1412 (!) 350 lb (158.8 kg)     Height 01/18/17 1412 5\' 11"  (1.803 m)     Head Circumference --      Peak Flow --      Pain Score 01/18/17 1413 7     Pain Loc --      Pain Edu? --      Excl. in GC? --    No data found.   Updated Vital Signs BP 135/83 (BP Location: Left Arm)   Pulse 78   Temp 98.1 F (36.7 C) (Oral)    Resp 16   Ht 5\' 11"  (1.803 m)   Wt (!) 350 lb (158.8 kg)   SpO2 99%   BMI 48.82 kg/m   Visual Acuity Right Eye Distance:   Left Eye Distance:   Bilateral Distance:    Right Eye Near:   Left Eye Near:    Bilateral Near:     Physical Exam  Constitutional: She is oriented to person, place, and time. She appears well-developed and well-nourished.  Obese white female  HENT:  Head: Normocephalic and atraumatic.  Eyes: EOM are normal. Pupils are equal, round, and reactive to light.  Neck: Normal range of motion. Neck supple.  Pulmonary/Chest: Effort normal.  Musculoskeletal: She exhibits edema and tenderness.       Right knee: She exhibits decreased range of motion, swelling, effusion, erythema and LCL laxity. She exhibits no deformity, no laceration and normal alignment. Tenderness found.  Neurological: She is alert and oriented to person, place, and time. No cranial nerve deficit.  Skin: Skin is warm.  Psychiatric: She has a normal mood and affect.  Vitals reviewed.    UC Treatments / Results  Labs (all labs ordered are listed, but only abnormal results are displayed) Labs Reviewed - No data to display  EKG  EKG Interpretation None       Radiology Dg Knee Complete 4 Views Left  Result Date: 01/18/2017 CLINICAL DATA:  Status post fall, lateral right knee pain EXAM: LEFT KNEE - COMPLETE 4+ VIEW COMPARISON:  None. FINDINGS: No evidence of fracture, dislocation, or joint effusion. No evidence of arthropathy or other focal bone abnormality. Soft tissues are unremarkable. IMPRESSION: No acute osseous injury of the right knee. Electronically Signed   By: Elige Ko   On: 01/18/2017 15:25    Procedures Procedures (including critical care time)  Medications Ordered in UC Medications  ketorolac (TORADOL) injection 60 mg (60 mg Intramuscular Given 01/18/17 1459)     Initial Impression / Assessment and Plan / UC Course  I have reviewed the triage vital signs and the  nursing notes.  Pertinent labs & imaging results that were available during my care of the patient were reviewed by me and considered in my medical decision making (see chart for details).   spray of the right knee was  negative she was given 60 Toradol will be given Mobic 7.5 mg she only has one kidney. Work note given for work for Saturday Sunday and Monday she works 2 jobs but logical back to work on Tuesday or sooner she is better. Recommend ice to right knee and follow-up with her PCP if not better in the next for 5 days.    Final Clinical Impressions(s) / UC Diagnoses   Final diagnoses:  Contusion of right knee, initial encounter  Acute pain of right knee    New Prescriptions New Prescriptions   MELOXICAM (MOBIC) 7.5 MG TABLET    Take 1 tablet (7.5 mg total) by mouth daily.     Note: This dictation was prepared with Dragon dictation along with smaller phrase technology. Any transcriptional errors that result from this process are unintentional.   Hassan RowanEugene Jeriann Sayres, MD 01/18/17 [phone]

## 2017-03-25 IMAGING — CR DG ABDOMEN 2V
1 series · 4 of 4 positions shown · non-contrast
Comparison: CT dated 04/28/2014

CLINICAL DATA: 24-year-old female with epigastric pain.

EXAM:
ABDOMEN - 2 VIEW

[Series 1: w abdomen upright · 0.14mm/px · 4 of 4 slices shown]
[im 1/4]
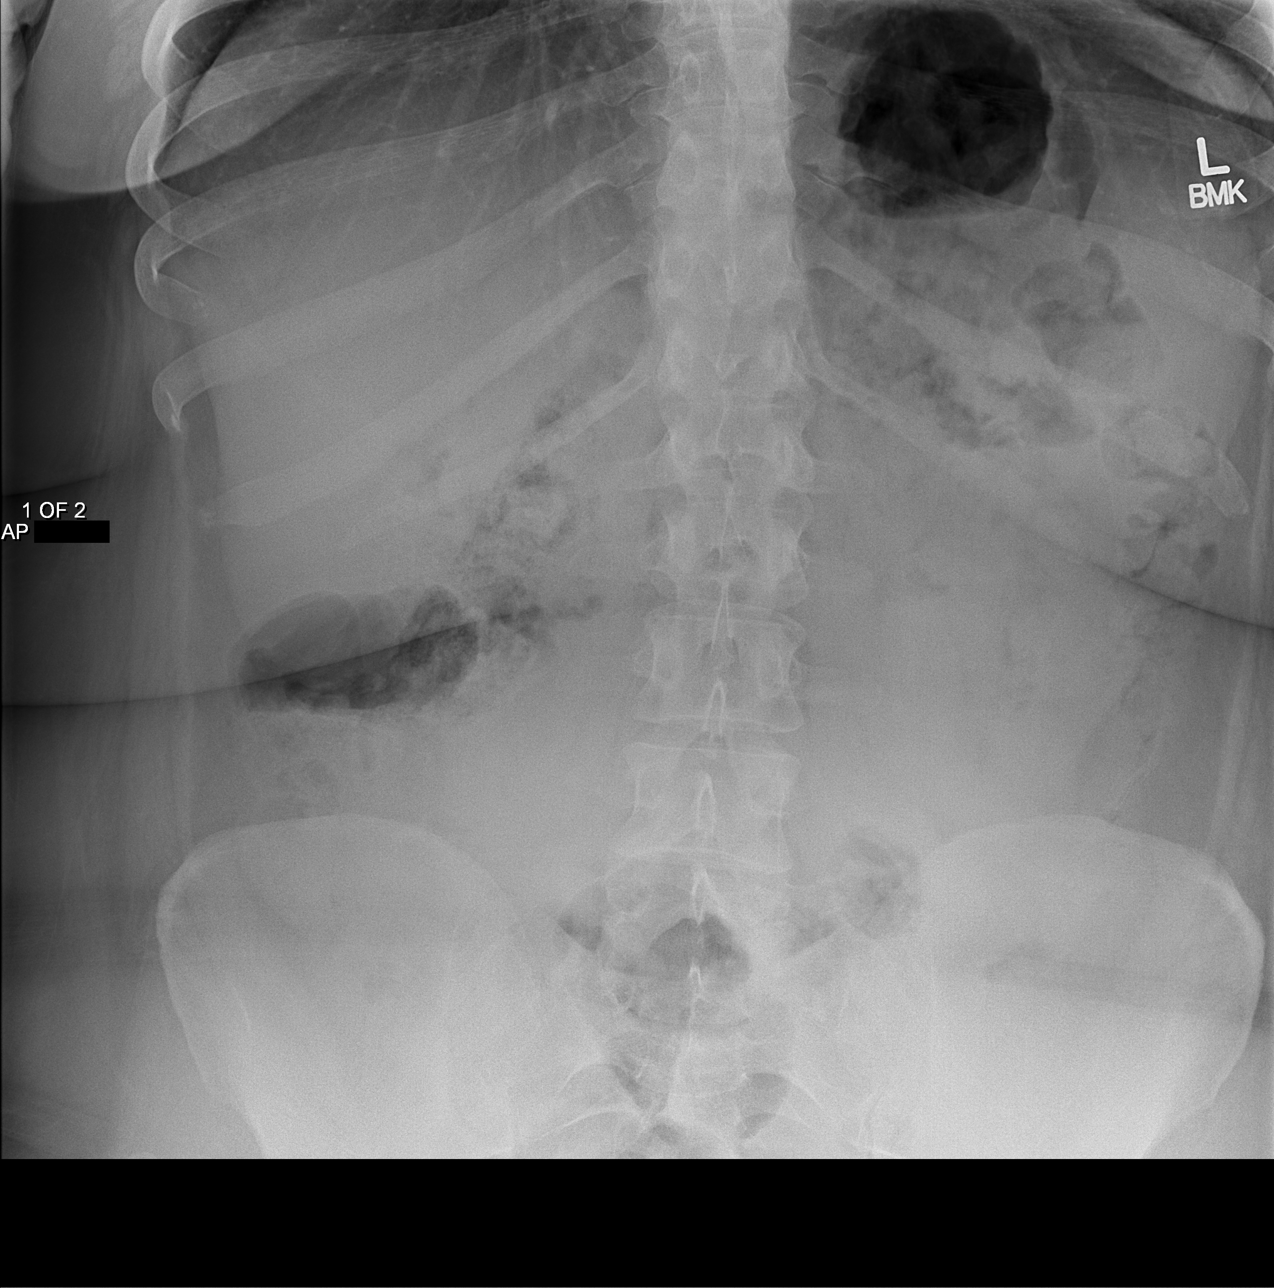
[im 2/4]
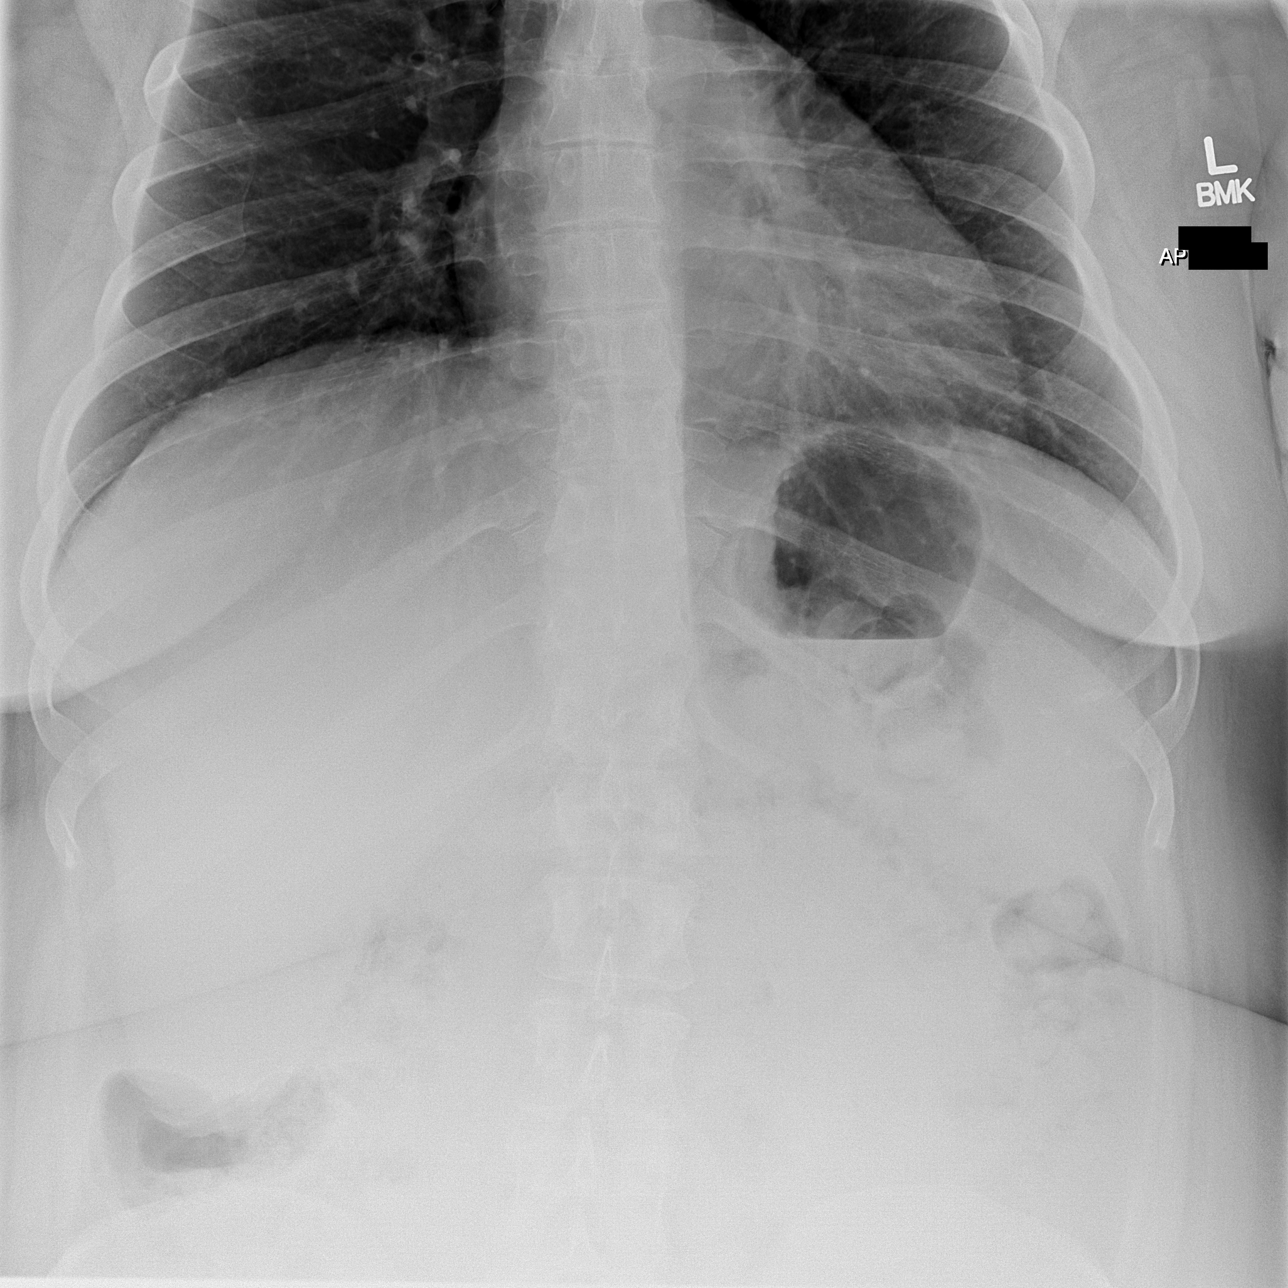
[im 3/4]
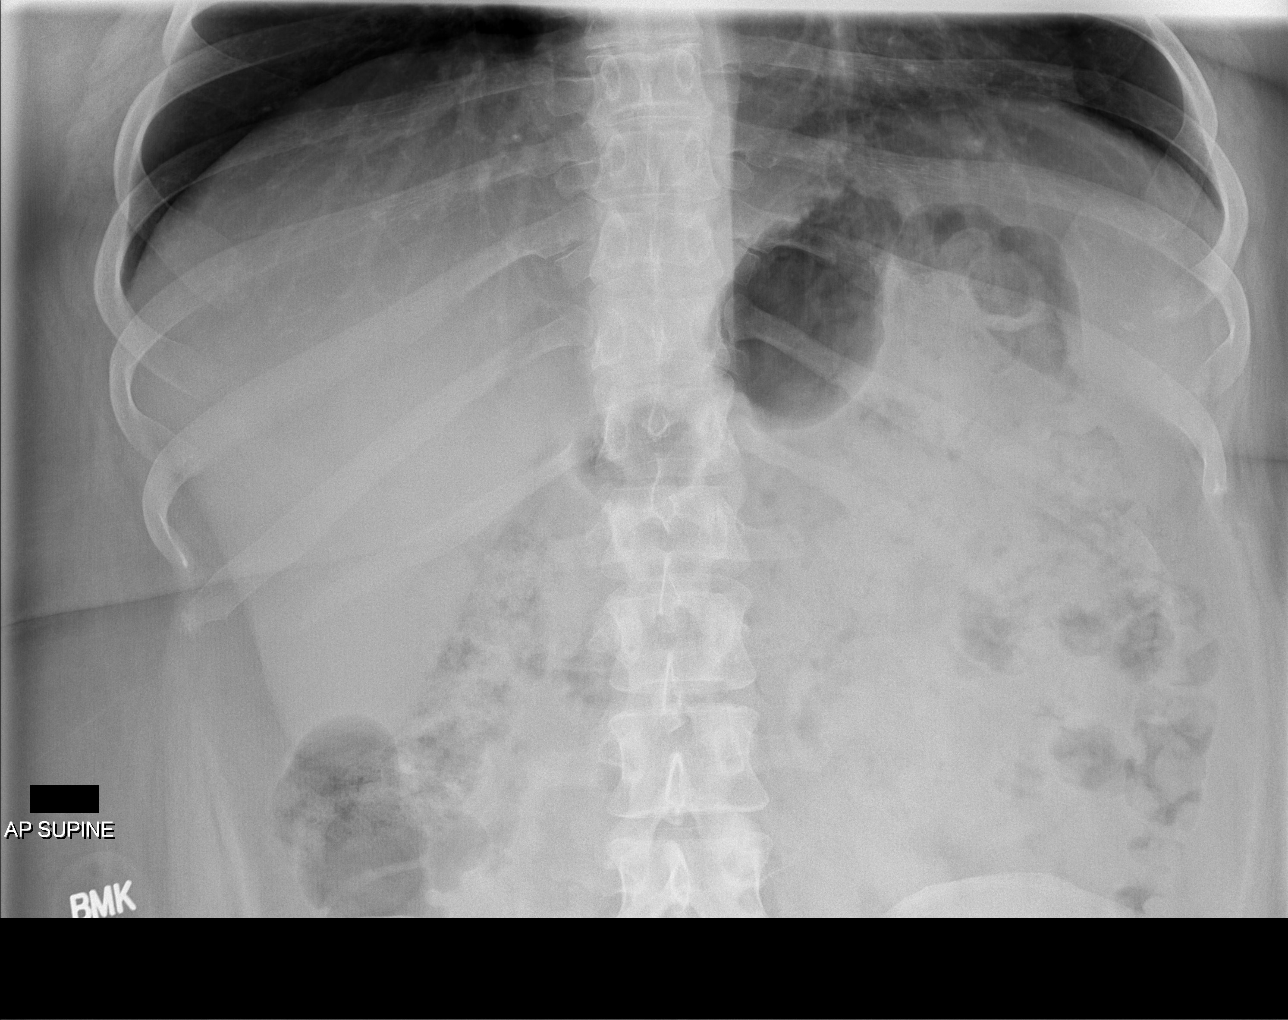
[im 4/4]
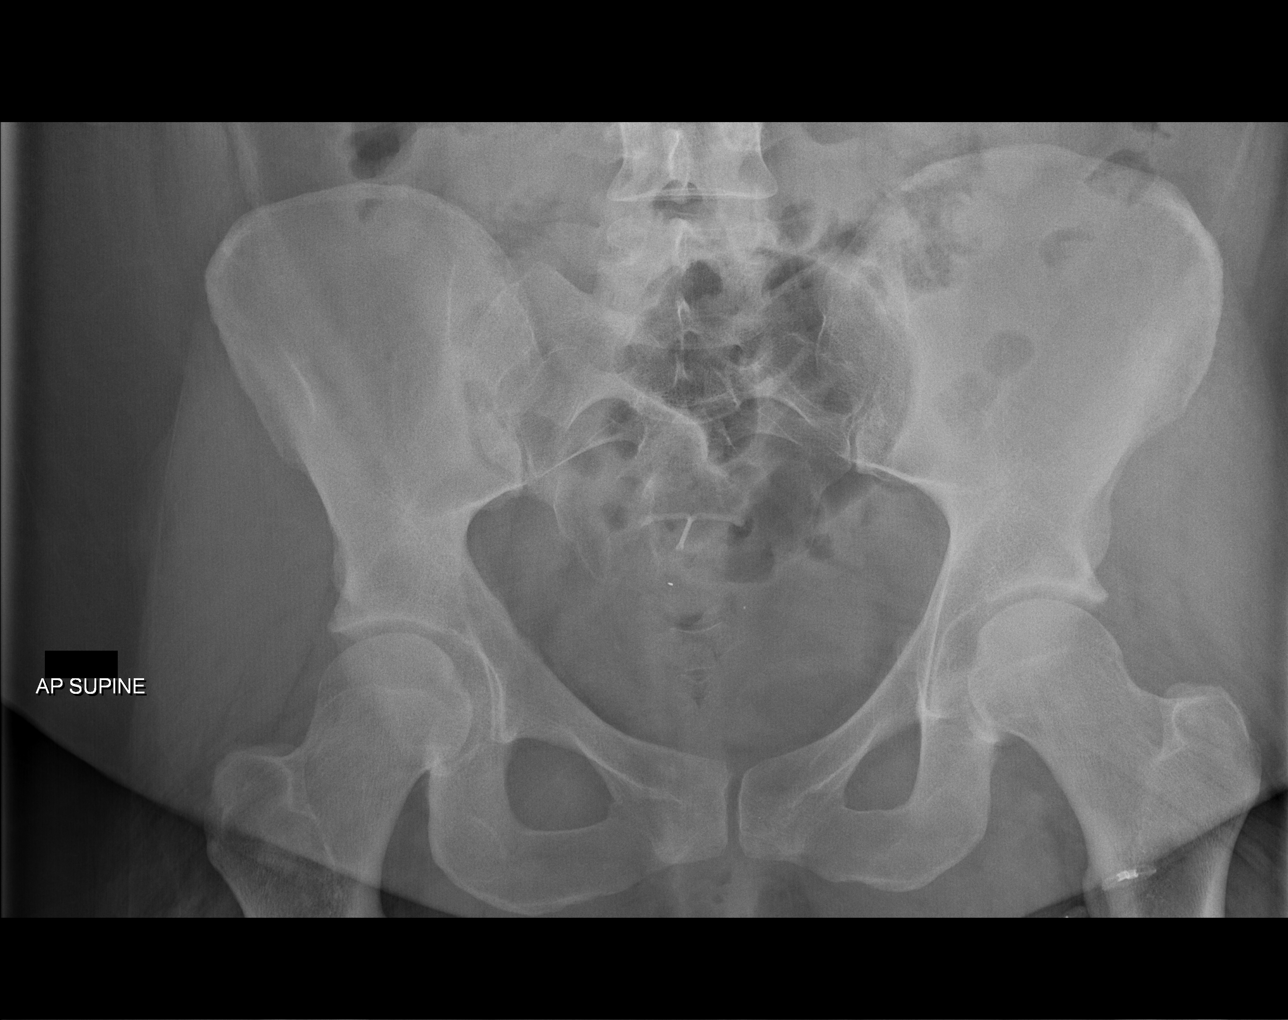

[4 of 4 positions shown; findings below may reference images not displayed]

FINDINGS: There is moderate stool throughout the colon. No evidence of bowel
obstruction. No free air. No radiopaque calculi. An intrauterine
device is noted within the pelvis. The osseous structures appear
unremarkable.
IMPRESSION: Negative.

## 2018-01-06 ENCOUNTER — Other Ambulatory Visit: Payer: Self-pay

## 2018-01-06 ENCOUNTER — Emergency Department: Payer: Self-pay

## 2018-01-06 ENCOUNTER — Encounter: Payer: Self-pay | Admitting: Emergency Medicine

## 2018-01-06 ENCOUNTER — Emergency Department
Admission: EM | Admit: 2018-01-06 | Discharge: 2018-01-06 | Disposition: A | Discharge status: Home / Self Care | Payer: Self-pay | Attending: Emergency Medicine | Admitting: Emergency Medicine

## 2018-01-06 DIAGNOSIS — Y939 Activity, unspecified: Secondary | ICD-10-CM | POA: Insufficient documentation

## 2018-01-06 DIAGNOSIS — Z79899 Other long term (current) drug therapy: Secondary | ICD-10-CM | POA: Insufficient documentation

## 2018-01-06 DIAGNOSIS — I10 Essential (primary) hypertension: Secondary | ICD-10-CM | POA: Insufficient documentation

## 2018-01-06 DIAGNOSIS — Y999 Unspecified external cause status: Secondary | ICD-10-CM | POA: Insufficient documentation

## 2018-01-06 DIAGNOSIS — W108XXA Fall (on) (from) other stairs and steps, initial encounter: Secondary | ICD-10-CM | POA: Insufficient documentation

## 2018-01-06 DIAGNOSIS — W19XXXA Unspecified fall, initial encounter: Secondary | ICD-10-CM

## 2018-01-06 DIAGNOSIS — Y92009 Unspecified place in unspecified non-institutional (private) residence as the place of occurrence of the external cause: Secondary | ICD-10-CM | POA: Insufficient documentation

## 2018-01-06 DIAGNOSIS — S8002XA Contusion of left knee, initial encounter: Secondary | ICD-10-CM | POA: Insufficient documentation

## 2018-01-06 DIAGNOSIS — S93601A Unspecified sprain of right foot, initial encounter: Secondary | ICD-10-CM | POA: Insufficient documentation

## 2018-01-06 DIAGNOSIS — F17210 Nicotine dependence, cigarettes, uncomplicated: Secondary | ICD-10-CM | POA: Insufficient documentation

## 2018-01-06 MED ORDER — IBUPROFEN 800 MG PO TABS: 800.0000 mg | ORAL_TABLET | Freq: Three times a day (TID) | ORAL | 0 refills | Status: DC | PRN

## 2018-01-06 MED ORDER — IBUPROFEN 800 MG PO TABS
800.0000 mg | ORAL_TABLET | Freq: Once | ORAL | Status: AC
Start: 2018-01-06 — End: 2018-01-06
  Administered 2018-01-06: 800 mg via ORAL
  Filled 2018-01-06: qty 1

## 2018-01-06 MED ORDER — TRAMADOL HCL 50 MG PO TABS: 50.0000 mg | ORAL_TABLET | Freq: Four times a day (QID) | ORAL | 0 refills | Status: DC | PRN

## 2018-01-06 MED ORDER — OXYCODONE-ACETAMINOPHEN 5-325 MG PO TABS
1.0000 | ORAL_TABLET | Freq: Once | ORAL | Status: AC
  Administered 2018-01-06: 1 via ORAL
  Filled 2018-01-06: qty 1

## 2018-01-06 NOTE — ED Provider Notes (Signed)
[facility] Emergency Department Provider Note   ____________________________________________   None    (approximate)  I have reviewed the triage vital signs and the nursing notes.   HISTORY  Chief Complaint Fall    HPI Karen Hunt is a 27 y.o. female patient complaining left anterior knee and right foot pain secondary to a trip and fall prior to arrival. Patient states she tripped coming down her stairs. Patient denies loss of sensation to the knee or foot but states pain increase with weightbearing. No palliative measures for complaintpatient rates pain as 8/10. Patient described a pain as "achy".   Past Medical History:  Diagnosis Date  . Hypertension     There are no active problems to display for this patient.   Past Surgical History:  Procedure Laterality Date  . c section    . FOOT SURGERY Left   . KIDNEY SURGERY      Prior to Admission medications   Medication Sig Start Date End Date Taking? Authorizing Provider  albuterol (PROVENTIL HFA;VENTOLIN HFA) 108 (90 BASE) MCG/ACT inhaler Inhale 4-6 puffs by mouth every 4 hours as needed for wheezing, cough, and/or shortness of breath 08/29/15   Loleta Rose, MD  famotidine (PEPCID) 20 MG tablet Take 1 tablet (20 mg total) by mouth 2 (two) times daily. 02/08/16 02/07/17  Emily Filbert, MD  fexofenadine-pseudoephedrine (ALLEGRA-D) 60-120 MG 12 hr tablet Take 1 tablet by mouth every 12 (twelve) hours. 10/01/16   Hassan Rowan, MD  fluticasone (FLONASE) 50 MCG/ACT nasal spray Place 2 sprays into both nostrils daily. 10/01/16   Hassan Rowan, MD  ibuprofen (ADVIL,MOTRIN) 800 MG tablet Take 1 tablet (800 mg total) by mouth every 8 (eight) hours as needed for moderate pain. 01/06/18   Joni Reining, PA-C  levonorgestrel (MIRENA) 20 MCG/24HR IUD 1 each by Intrauterine route once.    [provider]  meloxicam (MOBIC) 7.5 MG tablet Take 1 tablet (7.5 mg total) by mouth daily. 01/18/17    Hassan Rowan, MD  traMADol (ULTRAM) 50 MG tablet Take 1 tablet (50 mg total) by mouth every 6 (six) hours as needed. 01/06/18 01/06/19  Joni Reining, PA-C    Allergies Ciprofloxacin  History reviewed. No pertinent family history.  Social History Social History   Tobacco Use  . Smoking status: Current Every Day Smoker    Packs/day: 1.00    Types: Cigarettes  . Smokeless tobacco: Never Used  Substance Use Topics  . Alcohol use: Yes  . Drug use: No    Review of Systems Constitutional: No fever/chills Eyes: No visual changes. ENT: No sore throat. Cardiovascular: Denies chest pain. Respiratory: Denies shortness of breath. Gastrointestinal: No abdominal pain.  No nausea, no vomiting.  No diarrhea.  No constipation. Genitourinary: Negative for dysuria. Musculoskeletal: Left knee and right foot pain Skin: Negative for rash. Neurological: Negative for headaches, focal weakness or numbness. Endocrine:Hypertension Allergic/Immunilogical: Cipro ____________________________________________   PHYSICAL EXAM:  VITAL SIGNS: ED Triage Vitals  Enc Vitals Group     BP 01/06/18 1311 (!) 152/85     Pulse Rate 01/06/18 1311 76     Resp 01/06/18 1311 18     Temp 01/06/18 1311 98.1 F (36.7 C)     Temp Source 01/06/18 1311 Oral     SpO2 01/06/18 1311 97 %     Weight 01/06/18 1312 (!) 350 lb (158.8 kg)     Height 01/06/18 1312 5\' 11"  (1.803 m)     Head Circumference --  Peak Flow --      Pain Score 01/06/18 1314 8     Pain Loc --      Pain Edu? --      Excl. in GC? --    Constitutional: Alert and oriented. Well appearing and in no acute distress. Morbid obesity Neck: No stridor.   Cardiovascular: Normal rate, regular rhythm. Grossly normal heart sounds.  Good peripheral circulation. Respiratory: Normal respiratory effort.  No retractions. Lungs CTAB. Gastrointestinal: Soft and nontender. No distention. No abdominal bruits. No CVA tenderness. Musculoskeletal: No obvious  deformity to the left knee or right foot. Patient has full and equal range of motion of the knee and foot while sitting. Patient refused to weight-bear. Neurologic:  Normal speech and language. No gross focal neurologic deficits are appreciated. No gait instability. Skin:  Skin is warm, dry and intact. No rash noted. Psychiatric: Mood and affect are normal. Speech and behavior are normal.  ____________________________________________   LABS (all labs ordered are listed, but only abnormal results are displayed)  Labs Reviewed - No data to display ____________________________________________  EKG   ____________________________________________  RADIOLOGY  Dg Knee 2 Views Left  Result Date: 01/06/2018 CLINICAL DATA:  Larey Seat down 3 wooden steps today. Left lateral knee pain. EXAM: LEFT KNEE - 1-2 VIEW COMPARISON:  Report of a left knee series of March 22, 2003 FINDINGS: The bones are subjectively adequately mineralized. The joint spaces are well maintained. There beaking of the tibial spines. There is no acute fracture or dislocation. There is no joint effusion. The soft tissues exhibit no acute abnormalities. IMPRESSION: There is no acute bony abnormality of the left knee. Minimal osteoarthritic spurring is noted. Electronically Signed   By: David  [place] M.D.   On: 01/06/2018 14:38   Dg Foot Complete Right  Result Date: 01/06/2018 CLINICAL DATA:  Larey Seat down 3 Wood[address] stairs at home today and is complaining of medial foot pain radiating across the metatarsals. EXAM: RIGHT FOOT COMPLETE - 3+ VIEW COMPARISON:  Right ankle series of January 29, 2014 FINDINGS: The bones are subjectively adequately mineralized. There is no acute fracture nor dislocation. The joint spaces are well maintained. There is mild soft tissue swelling dorsally over the midfoot. IMPRESSION: There is no acute or significant chronic bony abnormality of the right foot. There is dorsal midfoot soft tissue swelling. Electronically  Signed   By: David  [place] M.D.   On: 01/06/2018 14:06    ____________________________________________   PROCEDURES  Procedure(s) performed: None  Procedures  Critical Care performed: No  ____________________________________________   INITIAL IMPRESSION / ASSESSMENT AND PLAN / ED COURSE  As part of my medical decision making, I reviewed the following data within the electronic MEDICAL RECORD NUMBER    Right foot pain secondary to sprain. Left knee pain secondary to contusion. Discussed x-ray finding with patient. Patient given discharge care instructions. Patient given a work note and advised to follow her PCP his condition persists. Take medication as directed. Patient right foot was Ace wrapped by tech. Status post application patient neurovascularly intact but able to move all of her toes.      ____________________________________________   FINAL CLINICAL IMPRESSION(S) / ED DIAGNOSES  Final diagnoses:  Fall in home, initial encounter  Sprain of right foot, initial encounter  Contusion of left knee, initial encounter     ED Discharge Orders        Ordered    traMADol (ULTRAM) 50 MG tablet  Every 6 hours PRN  01/06/18 1456    ibuprofen (ADVIL,MOTRIN) 800 MG tablet  Every 8 hours PRN     01/06/18 1456       Note:  This document was prepared using Dragon voice recognition software and may include unintentional dictation errors.    Joni ReiningSmith, Frederika Hukill K, PA-C 01/06/18 1459    Joni ReiningSmith, Ellana Kawa K, PA-C 01/06/18 1508    Sharman CheekStafford, Phillip, MD 01/06/18 1535

## 2018-01-06 NOTE — ED Triage Notes (Signed)
Pt to ed with c/o mechanical fall today down 3 wooden stairs at her home today.  Now c/o pain to right foot and left knee. Pt in wheelchair at this time. Denies hitting head.

## 2018-08-17 DIAGNOSIS — F3341 Major depressive disorder, recurrent, in partial remission: Secondary | ICD-10-CM | POA: Insufficient documentation

## 2018-08-17 DIAGNOSIS — Z0000 Encounter for general adult medical examination without abnormal findings: Secondary | ICD-10-CM | POA: Insufficient documentation

## 2018-11-12 ENCOUNTER — Emergency Department: Payer: Medicaid Other

## 2018-11-12 ENCOUNTER — Encounter: Payer: Self-pay | Admitting: Emergency Medicine

## 2018-11-12 ENCOUNTER — Emergency Department
Admission: EM | Admit: 2018-11-12 | Discharge: 2018-11-12 | Disposition: A | Discharge status: Home / Self Care | Payer: Medicaid Other | Attending: Emergency Medicine | Admitting: Emergency Medicine

## 2018-11-12 ENCOUNTER — Other Ambulatory Visit: Payer: Self-pay

## 2018-11-12 DIAGNOSIS — Y999 Unspecified external cause status: Secondary | ICD-10-CM | POA: Diagnosis not present

## 2018-11-12 DIAGNOSIS — I10 Essential (primary) hypertension: Secondary | ICD-10-CM | POA: Insufficient documentation

## 2018-11-12 DIAGNOSIS — S63502A Unspecified sprain of left wrist, initial encounter: Secondary | ICD-10-CM | POA: Insufficient documentation

## 2018-11-12 DIAGNOSIS — Y929 Unspecified place or not applicable: Secondary | ICD-10-CM | POA: Insufficient documentation

## 2018-11-12 DIAGNOSIS — W109XXA Fall (on) (from) unspecified stairs and steps, initial encounter: Secondary | ICD-10-CM | POA: Diagnosis not present

## 2018-11-12 DIAGNOSIS — F17210 Nicotine dependence, cigarettes, uncomplicated: Secondary | ICD-10-CM | POA: Diagnosis not present

## 2018-11-12 DIAGNOSIS — Y9301 Activity, walking, marching and hiking: Secondary | ICD-10-CM | POA: Insufficient documentation

## 2018-11-12 DIAGNOSIS — W19XXXA Unspecified fall, initial encounter: Secondary | ICD-10-CM

## 2018-11-12 DIAGNOSIS — Z79899 Other long term (current) drug therapy: Secondary | ICD-10-CM | POA: Diagnosis not present

## 2018-11-12 DIAGNOSIS — S6992XA Unspecified injury of left wrist, hand and finger(s), initial encounter: Secondary | ICD-10-CM | POA: Diagnosis present

## 2018-11-12 MED ORDER — MELOXICAM 15 MG PO TABS: 15.0000 mg | ORAL_TABLET | Freq: Every day | ORAL | 2 refills | Status: DC

## 2018-11-12 MED ORDER — OXYCODONE-ACETAMINOPHEN 5-325 MG PO TABS
ORAL_TABLET | ORAL | Status: AC
  Filled 2018-11-12: qty 1

## 2018-11-12 MED ORDER — TRAMADOL HCL 50 MG PO TABS: 50.0000 mg | ORAL_TABLET | Freq: Four times a day (QID) | ORAL | 0 refills | Status: DC | PRN

## 2018-11-12 MED ORDER — OXYCODONE-ACETAMINOPHEN 5-325 MG PO TABS
1.0000 | ORAL_TABLET | Freq: Once | ORAL | Status: AC
  Administered 2018-11-12: 1 via ORAL

## 2018-11-12 NOTE — Discharge Instructions (Addendum)
Follow up with dr Odis Lusterbowers , call for an appointment for a recheck in about 1 week, wear the splint until released by orthopedics.  Take the meloxicam and tramadol as needed for pain.  Tramadol is a controlled substance so beware of addiction.  Apply ice to the hand and wrist.

## 2018-11-12 NOTE — ED Notes (Signed)
Pt to the er for pain to the left wrist following a fall. Pt was taking dogs outon a leash when they ran pulling her down. Pt attempted to grab something to help avoid falling and twisted her wrist. Pt has gross swelling and bruising to the left wrist. Pain to the posterior and medial side of the wrist. Limited ROM present.

## 2018-11-12 NOTE — ED Notes (Signed)
Radiology to bedside at this time. 

## 2018-11-12 NOTE — ED Provider Notes (Signed)
[facility] Emergency Department Provider Note  ____________________________________________   None    (approximate)  I have reviewed the triage vital signs and the nursing notes.   HISTORY  Chief Complaint Fall    HPI Karen Hunt is a 27 y.o. female presents to the ER with complaints of left wrist and hand pain after a fall down 5 or 6 steps while holding a dog leash.  The dog pulled her off balance, she tried to catch herself by grabbing the railing and twisted the arm.  She denies loc, neck pain, chest pain, abdominal pain.  Some pain in the mid to lower back.  No neck pain.     Past Medical History:  Diagnosis Date  . Hypertension     There are no active problems to display for this patient.   Past Surgical History:  Procedure Laterality Date  . c section    . FOOT SURGERY Left   . KIDNEY SURGERY      Prior to Admission medications   Medication Sig Start Date End Date Taking? Authorizing Provider  albuterol (PROVENTIL HFA;VENTOLIN HFA) 108 (90 BASE) MCG/ACT inhaler Inhale 4-6 puffs by mouth every 4 hours as needed for wheezing, cough, and/or shortness of breath 08/29/15   Loleta RoseForbach, Cory, MD  famotidine (PEPCID) 20 MG tablet Take 1 tablet (20 mg total) by mouth 2 (two) times daily. 02/08/16 02/07/17  Emily FilbertWilliams, Jonathan E, MD  fexofenadine-pseudoephedrine (ALLEGRA-D) 60-120 MG 12 hr tablet Take 1 tablet by mouth every 12 (twelve) hours. 10/01/16   Hassan RowanWade, Eugene, MD  fluticasone (FLONASE) 50 MCG/ACT nasal spray Place 2 sprays into both nostrils daily. 10/01/16   Hassan RowanWade, Eugene, MD  ibuprofen (ADVIL,MOTRIN) 800 MG tablet Take 1 tablet (800 mg total) by mouth every 8 (eight) hours as needed for moderate pain. 01/06/18   Joni ReiningSmith, Ronald K, PA-C  levonorgestrel (MIRENA) 20 MCG/24HR IUD 1 each by Intrauterine route once.    [provider]  meloxicam (MOBIC) 15 MG tablet Take 1 tablet (15 mg total) by mouth daily. 11/12/18 11/12/19  Fisher, Roselyn BeringSusan W,  PA-C  meloxicam (MOBIC) 7.5 MG tablet Take 1 tablet (7.5 mg total) by mouth daily. 01/18/17   Hassan RowanWade, Eugene, MD  traMADol (ULTRAM) 50 MG tablet Take 1 tablet (50 mg total) by mouth every 6 (six) hours as needed. 01/06/18 01/06/19  Joni ReiningSmith, Ronald K, PA-C  traMADol (ULTRAM) 50 MG tablet Take 1 tablet (50 mg total) by mouth every 6 (six) hours as needed. 11/12/18   Faythe GheeFisher, Susan W, PA-C    Allergies Ciprofloxacin  No family history on file.  Social History Social History   Tobacco Use  . Smoking status: Current Every Day Smoker    Packs/day: 1.00    Types: Cigarettes  . Smokeless tobacco: Never Used  Substance Use Topics  . Alcohol use: Yes  . Drug use: No    Review of Systems  Constitutional: No fever/chills Eyes: No visual changes. ENT: No sore throat. Respiratory: Denies cough Genitourinary: Negative for dysuria. Musculoskeletal: Negative for back pain.Positive for hand and wrist pain Skin: Negative for rash.    ____________________________________________   PHYSICAL EXAM:  VITAL SIGNS: ED Triage Vitals  Enc Vitals Group     BP 11/12/18 1416 (!) 149/96     Pulse Rate 11/12/18 1416 82     Resp 11/12/18 1416 16     Temp 11/12/18 1416 98.2 F (36.8 C)     Temp Source 11/12/18 1416 Oral  SpO2 11/12/18 1416 97 %     Weight 11/12/18 1420 (!) 330 lb (149.7 kg)     Height 11/12/18 1420 5\' 11"  (1.803 m)     Head Circumference --      Peak Flow --      Pain Score 11/12/18 1420 8     Pain Loc --      Pain Edu? --      Excl. in GC? --     Constitutional: Alert and oriented. Well appearing and in no acute distress. Eyes: Conjunctivae are normal.  Head: Atraumatic. Nose: No congestion/rhinnorhea. Mouth/Throat: Mucous membranes are moist.   Neck:  supple no lymphadenopathy noted Cardiovascular: Normal rate, regular rhythm. Heart sounds are normal Respiratory: Normal respiratory effort.  No retractions, lungs c t a  GU: deferred Musculoskeletal: decreased rom of left  wrist secondary to discomfort, left hand is swollen and tender. Left elbow is minimally tender, left shoulder is minimally tender.  Neck and spine are neg for bony tenderness, paravertebrals are tender.  N/v intact Neurologic:  Normal speech and language.  Skin:  Skin is warm, dry and intact. No rash noted. Psychiatric: Mood and affect are normal. Speech and behavior are normal.  ____________________________________________   LABS (all labs ordered are listed, but only abnormal results are displayed)  Labs Reviewed - No data to display ____________________________________________   ____________________________________________  RADIOLOGY  Herby Abraham of left wrist with views of the hand are negative for fracture  ____________________________________________   PROCEDURES  Procedure(s) performed: volar ocl and sling applied by the tech  Procedures    ____________________________________________   INITIAL IMPRESSION / ASSESSMENT AND PLAN / ED COURSE  Pertinent labs & imaging results that were available during my care of the patient were reviewed by me and considered in my medical decision making (see chart for details).   The patient is a 27 year old female that had a fall earlier today.  She is complaining of left hand and wrist pain.  She denies any other injuries.  On physical exam the left wrist and hand are tender to palpation.  The hand does have swelling noted.  X-ray of the left wrist which also shows the hand is negative for any fracture.  Due to the amount of swelling patient was placed in a volar OCL.  She is given a sling for comfort.  She is to follow-up with orthopedics.  She is given a prescription for meloxicam and tramadol.  She is to apply ice to the extremity.  Follow-up with orthopedics.  She states she understands will comply.  She is discharged in stable condition.     As part of my medical decision making, I reviewed the following data within the electronic  MEDICAL RECORD NUMBER Nursing notes reviewed and incorporated, Old chart reviewed, Radiograph reviewed x-ray of the left wrist is negative, Notes from prior ED visits and [place] Controlled Substance Database  ____________________________________________   FINAL CLINICAL IMPRESSION(S) / ED DIAGNOSES  Final diagnoses:  Fall, initial encounter  Left wrist sprain, initial encounter      NEW MEDICATIONS STARTED DURING THIS VISIT:  Discharge Medication List as of 11/12/2018  3:43 PM    START taking these medications   Details  !! meloxicam (MOBIC) 15 MG tablet Take 1 tablet (15 mg total) by mouth daily., Starting Thu 11/12/2018, Until Fri 11/12/2019, Normal    !! traMADol (ULTRAM) 50 MG tablet Take 1 tablet (50 mg total) by mouth every 6 (six) hours as needed., Starting Thu 11/12/2018,  Normal     !! - Potential duplicate medications found. Please discuss with provider.       Note:  This document was prepared using Dragon voice recognition software and may include unintentional dictation errors.    Faythe Ghee, PA-C 11/12/18 2020    Pershing Proud Myra Rude, MD 11/13/18 0000

## 2018-11-12 NOTE — ED Triage Notes (Signed)
Patient presents to the ED post fall with left wrist pain.  Patient states she was walking her dogs down a flight of stairs and dogs "took off" and patient fell down 5 stairs and wrist was twisted in railing.  Patient states she hit her head but it is not very painful and patient did not lose consciousness and does not take blood thinners.  Patient is in no obvious distress at this time.  Wrist appears reddened and swollen.

## 2018-11-24 ENCOUNTER — Emergency Department: Payer: Medicaid Other

## 2018-11-24 ENCOUNTER — Emergency Department
Admission: EM | Admit: 2018-11-24 | Discharge: 2018-11-24 | Disposition: A | Discharge status: Home / Self Care | Payer: Medicaid Other | Attending: Emergency Medicine | Admitting: Emergency Medicine

## 2018-11-24 ENCOUNTER — Encounter: Payer: Self-pay | Admitting: Emergency Medicine

## 2018-11-24 ENCOUNTER — Other Ambulatory Visit: Payer: Self-pay

## 2018-11-24 DIAGNOSIS — F17210 Nicotine dependence, cigarettes, uncomplicated: Secondary | ICD-10-CM | POA: Diagnosis not present

## 2018-11-24 DIAGNOSIS — Y998 Other external cause status: Secondary | ICD-10-CM | POA: Diagnosis not present

## 2018-11-24 DIAGNOSIS — Z79899 Other long term (current) drug therapy: Secondary | ICD-10-CM | POA: Insufficient documentation

## 2018-11-24 DIAGNOSIS — I10 Essential (primary) hypertension: Secondary | ICD-10-CM | POA: Insufficient documentation

## 2018-11-24 DIAGNOSIS — S93401A Sprain of unspecified ligament of right ankle, initial encounter: Secondary | ICD-10-CM | POA: Diagnosis not present

## 2018-11-24 DIAGNOSIS — S99911A Unspecified injury of right ankle, initial encounter: Secondary | ICD-10-CM | POA: Diagnosis present

## 2018-11-24 DIAGNOSIS — Y9389 Activity, other specified: Secondary | ICD-10-CM | POA: Diagnosis not present

## 2018-11-24 DIAGNOSIS — Y92480 Sidewalk as the place of occurrence of the external cause: Secondary | ICD-10-CM | POA: Insufficient documentation

## 2018-11-24 DIAGNOSIS — X509XXA Other and unspecified overexertion or strenuous movements or postures, initial encounter: Secondary | ICD-10-CM | POA: Insufficient documentation

## 2018-11-24 MED ORDER — OXYCODONE-ACETAMINOPHEN 5-325 MG PO TABS
1.0000 | ORAL_TABLET | Freq: Once | ORAL | Status: AC
  Administered 2018-11-24: 1 via ORAL
  Filled 2018-11-24: qty 1

## 2018-11-24 NOTE — ED Provider Notes (Signed)
[facility] Emergency Department Provider Note  ____________________________________________   First MD Initiated Contact with Patient 11/24/18 1133     (approximate)  I have reviewed the triage vital signs and the nursing notes.   HISTORY  Chief Complaint Ankle Pain    HPI Karen Hunt is a 27 y.o. female resents emergency department complaining of right foot and ankle pain.  She states that she was outside sitting on the curb smoking a cigarette at work when she twisted her ankle.  She states she heard a pop.  She is been unable to bear weight on the foot and ankle since.  She denies any other injuries.    Past Medical History:  Diagnosis Date  . Hypertension     There are no active problems to display for this patient.   Past Surgical History:  Procedure Laterality Date  . c section    . FOOT SURGERY Left   . KIDNEY SURGERY      Prior to Admission medications   Medication Sig Start Date End Date Taking? Authorizing Provider  albuterol (PROVENTIL HFA;VENTOLIN HFA) 108 (90 BASE) MCG/ACT inhaler Inhale 4-6 puffs by mouth every 4 hours as needed for wheezing, cough, and/or shortness of breath 08/29/15   Loleta RoseForbach, Cory, MD  levonorgestrel (MIRENA) 20 MCG/24HR IUD 1 each by Intrauterine route once.    [provider]  meloxicam (MOBIC) 15 MG tablet Take 1 tablet (15 mg total) by mouth daily. 11/12/18 11/12/19  Faythe GheeFisher, Shamya Macfadden W, PA-C    Allergies Ciprofloxacin  No family history on file.  Social History Social History   Tobacco Use  . Smoking status: Current Every Day Smoker    Packs/day: 1.00    Types: Cigarettes  . Smokeless tobacco: Never Used  Substance Use Topics  . Alcohol use: Yes  . Drug use: No    Review of Systems  Constitutional: No fever/chills Eyes: No visual changes. ENT: No sore throat. Respiratory: Denies cough Genitourinary: Negative for dysuria. Musculoskeletal: Negative for back pain.  Right foot  and ankle pain Skin: Negative for rash.    ____________________________________________   PHYSICAL EXAM:  VITAL SIGNS: ED Triage Vitals  Enc Vitals Group     BP 11/24/18 1126 (!) 161/92     Pulse Rate 11/24/18 1126 77     Resp 11/24/18 1126 18     Temp 11/24/18 1126 98.4 F (36.9 C)     Temp Source 11/24/18 1126 Oral     SpO2 11/24/18 1126 98 %     Weight 11/24/18 1127 (!) 330 lb (149.7 kg)     Height 11/24/18 1127 5\' 11"  (1.803 m)     Head Circumference --      Peak Flow --      Pain Score 11/24/18 1126 9     Pain Loc --      Pain Edu? --      Excl. in GC? --     Constitutional: Alert and oriented. Well appearing and in no acute distress.  Patient is crying due to pain Eyes: Conjunctivae are normal.  Head: Atraumatic. Nose: No congestion/rhinnorhea. Mouth/Throat: Mucous membranes are moist.   Neck:  supple no lymphadenopathy noted Cardiovascular: Normal rate, regular rhythm.  Respiratory: Normal respiratory effort.  No retractions GU: deferred Musculoskeletal: Right ankle is swollen at the lateral malleolus, the right foot is tender along fifth metatarsal.  Neurovascular is intact.  Patient has decreased range of motion secondary to discomfort.   Neurologic:  Normal  speech and language.  Skin:  Skin is warm, dry and intact. No rash noted. Psychiatric: Mood and affect are normal. Speech and behavior are normal.  ____________________________________________   LABS (all labs ordered are listed, but only abnormal results are displayed)  Labs Reviewed - No data to display ____________________________________________   ____________________________________________  RADIOLOGY  X-ray of the right ankle is negative X-ray of the right foot is negative  ____________________________________________   PROCEDURES  Procedure(s) performed: Ace wrap, stirrup splint, and crutches were provided by the  tech  Procedures    ____________________________________________   INITIAL IMPRESSION / ASSESSMENT AND PLAN / ED COURSE  Pertinent labs & imaging results that were available during my care of the patient were reviewed by me and considered in my medical decision making (see chart for details).   Patient is a 27 year old female presents emergency department complaining of right ankle pain after twisting her ankle.  On physical exam the right ankle is swollen and tender at the lateral malleolus and fourth and fifth metatarsals.  X-ray of the right ankle and right foot are both negative for fracture.  Explained the findings to the patient.  She has been given a Percocet 5/325 and seems to be more comfortable.  She was placed in a Ace wrap and stirrup splint.  She was given crutches.  She was given a work note for today and tomorrow.  She is to take meloxicam that she currently has at home due to a previous injury.  She is to follow-up with orthopedics if not better in 5 7 days.  She states she understands will comply.  She was discharged in stable condition.     As part of my medical decision making, I reviewed the following data within the electronic MEDICAL RECORD NUMBER Nursing notes reviewed and incorporated, Old chart reviewed, Radiograph reviewed x-ray of the right foot and ankle are negative, Notes from prior ED visits and [place] Controlled Substance Database  ____________________________________________   FINAL CLINICAL IMPRESSION(S) / ED DIAGNOSES  Final diagnoses:  Sprain of right ankle, unspecified ligament, initial encounter      NEW MEDICATIONS STARTED DURING THIS VISIT:  Current Discharge Medication List       Note:  This document was prepared using Dragon voice recognition software and may include unintentional dictation errors.    Faythe Ghee, PA-C 11/24/18 1231    Emily Filbert, MD 11/24/18 [phone]

## 2018-11-24 NOTE — ED Notes (Signed)
See triage note  Stats she stepped wring and rolled her right ankle  States she heard a "pop"  Positive swelling noted  Good pulses

## 2018-11-24 NOTE — ED Notes (Signed)
The patient stated that she was fine using the crutches and did not need any help with them.

## 2018-11-24 NOTE — ED Triage Notes (Signed)
PT arrived after mechanical injury causing pain to right ankle.

## 2018-11-24 NOTE — Discharge Instructions (Addendum)
Follow-up with regular doctor or orthopedics if not better in 5 to 7 days.  Dr. Odis LusterBowers is on call today to call his office for an appointment.  Use the crutches until he can bear weight without difficulty.  Wear the stirrup splint to give your ankle extra support.  Take the meloxicam that you currently have at home.  Return if worsening

## 2019-09-27 ENCOUNTER — Other Ambulatory Visit: Payer: Self-pay

## 2019-09-27 ENCOUNTER — Ambulatory Visit
Admission: EM | Admit: 2019-09-27 | Discharge: 2019-09-27 | Disposition: A | Discharge status: Home / Self Care | Payer: Medicaid Other | Attending: Internal Medicine | Admitting: Internal Medicine

## 2019-09-27 DIAGNOSIS — J029 Acute pharyngitis, unspecified: Secondary | ICD-10-CM

## 2019-09-27 DIAGNOSIS — R6889 Other general symptoms and signs: Secondary | ICD-10-CM

## 2019-09-27 DIAGNOSIS — R5383 Other fatigue: Secondary | ICD-10-CM | POA: Diagnosis not present

## 2019-09-27 DIAGNOSIS — R509 Fever, unspecified: Secondary | ICD-10-CM

## 2019-09-27 DIAGNOSIS — Z20822 Contact with and (suspected) exposure to covid-19: Secondary | ICD-10-CM

## 2019-09-27 DIAGNOSIS — R51 Headache: Secondary | ICD-10-CM | POA: Diagnosis not present

## 2019-09-27 MED ORDER — ACETAMINOPHEN 500 MG PO TABS: 500.0000 mg | ORAL_TABLET | Freq: Four times a day (QID) | ORAL | 0 refills | Status: DC | PRN

## 2019-09-27 MED ORDER — KETOROLAC TROMETHAMINE 60 MG/2ML IM SOLN
60.0000 mg | Freq: Once | INTRAMUSCULAR | Status: AC
  Administered 2019-09-27: 17:00:00 60 mg via INTRAMUSCULAR

## 2019-09-27 NOTE — ED Triage Notes (Signed)
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 Reports headache that got progressively worse when she was on the way back home. Pt COVID results pending.

## 2019-09-27 NOTE — ED Provider Notes (Addendum)
[facility]    CSN: [id number] Arrival date & time: 09/27/19  1604      History   Chief Complaint Chief Complaint  Patient presents with  . Headache  . Fever    HPI Karen Hunt is a 28 y.o. female with a history of hypertension comes to urgent care with nonproductive cough, fever of 101 Fahrenheit, headaches and general malaise.  Patient symptoms started yesterday.  She went for COVID testing in [facility] today.  On her way back home she started experiencing severe headaches and hence the visit to the urgent care.  Headache is global, throbbing in nature.  Currently 10 out of 10.  It is associated with some nausea but no vomiting.  No neck pain or stiffness.  No rash.  Patient has not tried any over-the-counter medications.  No known aggravating factors.  She denies any aura, photophobia or sound sensitivity.  Patient denies any numbness or tingling.  No difficulty finding her words. HPI  Past Medical History:  Diagnosis Date  . Hypertension     There are no active problems to display for this patient.   Past Surgical History:  Procedure Laterality Date  . c section    . FOOT SURGERY Left   . KIDNEY SURGERY      OB History    Gravida  2   Para      Term      Preterm      AB  1   Living  1     SAB  1   TAB      Ectopic      Multiple      Live Births               Home Medications    Prior to Admission medications   Medication Sig Start Date End Date Taking? Authorizing Provider  levonorgestrel (MIRENA) 20 MCG/24HR IUD 1 each by Intrauterine route once.   Yes [provider]  acetaminophen (TYLENOL) 500 MG tablet Take 1 tablet (500 mg total) by mouth every 6 (six) hours as needed. 09/27/19   Chase Picket, MD  albuterol (PROVENTIL HFA;VENTOLIN HFA) 108 (90 BASE) MCG/ACT inhaler Inhale 4-6 puffs by mouth every 4 hours as needed for wheezing, cough, and/or shortness of breath 08/29/15 09/27/19  Hinda Kehr, MD    Family History History reviewed. No pertinent family history.  Social History Social History   Tobacco Use  . Smoking status: Current Every Day Smoker    Packs/day: 1.00    Types: Cigarettes  . Smokeless tobacco: Never Used  Substance Use Topics  . Alcohol use: Yes  . Drug use: No     Allergies   Ciprofloxacin   Review of Systems Review of Systems  Constitutional: Positive for activity change, chills, fatigue and fever. Negative for unexpected weight change.  HENT: Positive for congestion and sore throat. Negative for mouth sores, postnasal drip, rhinorrhea and voice change.   Respiratory: Positive for cough. Negative for shortness of breath and wheezing.   Cardiovascular: Negative.   Gastrointestinal: Negative.   Endocrine: Negative.   Genitourinary: Negative.   Musculoskeletal: Positive for arthralgias, back pain and myalgias. Negative for joint swelling.  Skin: Negative.  Negative for rash and wound.  Neurological: Positive for dizziness and headaches. Negative for syncope, weakness and numbness.     Physical Exam Triage Vital Signs ED Triage Vitals [09/27/19 1616]  Enc Vitals Group     BP Marland Kitchen)  149/101     Pulse Rate 90     Resp 18     Temp 98 F (36.7 C)     Temp Source Oral     SpO2 99 %     Weight (!) 330 lb (149.7 kg)     Height 5\' 11"  (1.803 m)     Head Circumference      Peak Flow      Pain Score 10     Pain Loc      Pain Edu?      Excl. in GC?    No data found.  Updated Vital Signs BP 129/85 (BP Location: Left Arm)   Pulse 76   Temp 98.2 F (36.8 C) (Oral)   Resp 18   Ht 5\' 11"  (1.803 m)   Wt (!) 149.7 kg   SpO2 98%   BMI 46.03 kg/m   Visual Acuity Right Eye Distance:   Left Eye Distance:   Bilateral Distance:    Right Eye Near:   Left Eye Near:    Bilateral Near:     Physical Exam Vitals signs and nursing note reviewed.  Constitutional:      General: She is in acute distress.     Appearance: She is obese. She  is ill-appearing. She is not toxic-appearing or diaphoretic.  HENT:     Head: Normocephalic and atraumatic.     Mouth/Throat:     Mouth: Mucous membranes are moist.  Eyes:     Extraocular Movements: Extraocular movements intact.     Right eye: Normal extraocular motion.     Left eye: Normal extraocular motion.     Pupils: Pupils are equal, round, and reactive to light.  Neck:     Musculoskeletal: Normal range of motion. No neck rigidity.  Cardiovascular:     Rate and Rhythm: Normal rate and regular rhythm.     Heart sounds: Normal heart sounds. No murmur. No friction rub.  Pulmonary:     Effort: Pulmonary effort is normal.     Breath sounds: Normal breath sounds.  Abdominal:     General: Bowel sounds are normal.     Palpations: Abdomen is soft.  Musculoskeletal: Normal range of motion.  Lymphadenopathy:     Cervical: No cervical adenopathy.  Skin:    General: Skin is warm.     Capillary Refill: Capillary refill takes less than 2 seconds.     Coloration: Skin is not pale.     Findings: No erythema.  Neurological:     Mental Status: She is alert.     GCS: GCS eye subscore is 4. GCS verbal subscore is 5. GCS motor subscore is 6.      UC Treatments / Results  Labs (all labs ordered are listed, but only abnormal results are displayed) Labs Reviewed - No data to display  EKG   Radiology No results found.  Procedures Procedures (including critical care time)  Medications Ordered in UC Medications  ketorolac (TORADOL) injection 60 mg (60 mg Intramuscular Given 09/27/19 1716)    Initial Impression / Assessment and Plan / UC Course  I have reviewed the triage vital signs and the nursing notes.  Pertinent labs & imaging results that were available during my care of the patient were reviewed by me and considered in my medical decision making (see chart for details).     1.  Flulike illness with severe headache: Patient had a investigation for COVID-19 Toradol 60 mg  IM once Tylenol extra  strength every 6 hours as needed If patient's headache does not improve she is advised to go to emergency department for CT scan of the brain.  Patient should continue to self isolate until COVID-19 test results are available.  Patient had elevated blood pressure on initial evaluation.  15 minutes after IM Toradol was given vitals improved.  Headache did not worsen.  Patient is advised to get some rest at home and if the headache does not improve she needs to go to ED for further evaluation. Final Clinical Impressions(s) / UC Diagnoses   Final diagnoses:  Flu-like symptoms   Discharge Instructions   None    ED Prescriptions    Medication Sig Dispense Auth. Provider   acetaminophen (TYLENOL) 500 MG tablet Take 1 tablet (500 mg total) by mouth every 6 (six) hours as needed. 30 tablet Jashon Ishida, Britta Mccreedy, MD     PDMP not reviewed this encounter.   Merrilee Jansky, MD 09/27/19 1846    Merrilee Jansky, MD 09/27/19 [phone]

## 2019-09-29 LAB — NOVEL CORONAVIRUS, NAA: SARS-CoV-2, NAA: NOT DETECTED

## 2019-10-19 DIAGNOSIS — R519 Headache, unspecified: Secondary | ICD-10-CM | POA: Insufficient documentation

## 2019-10-22 DIAGNOSIS — F17200 Nicotine dependence, unspecified, uncomplicated: Secondary | ICD-10-CM | POA: Insufficient documentation

## 2019-11-02 DIAGNOSIS — G40909 Epilepsy, unspecified, not intractable, without status epilepticus: Secondary | ICD-10-CM | POA: Insufficient documentation

## 2019-11-02 DIAGNOSIS — Z8673 Personal history of transient ischemic attack (TIA), and cerebral infarction without residual deficits: Secondary | ICD-10-CM | POA: Insufficient documentation

## 2020-08-25 ENCOUNTER — Emergency Department
Admission: EM | Admit: 2020-08-25 | Discharge: 2020-08-25 | Disposition: A | Discharge status: Home / Self Care | Payer: Medicaid Other | Attending: Emergency Medicine | Admitting: Emergency Medicine

## 2020-08-25 ENCOUNTER — Other Ambulatory Visit: Payer: Self-pay

## 2020-08-25 ENCOUNTER — Encounter: Payer: Self-pay | Admitting: Emergency Medicine

## 2020-08-25 DIAGNOSIS — G43909 Migraine, unspecified, not intractable, without status migrainosus: Secondary | ICD-10-CM | POA: Diagnosis present

## 2020-08-25 DIAGNOSIS — Z5321 Procedure and treatment not carried out due to patient leaving prior to being seen by health care provider: Secondary | ICD-10-CM | POA: Insufficient documentation

## 2020-08-25 DIAGNOSIS — R519 Headache, unspecified: Secondary | ICD-10-CM

## 2020-08-25 HISTORY — DX: Transient cerebral ischemic attack, unspecified: G45.9

## 2020-08-25 HISTORY — DX: Migraine, unspecified, not intractable, without status migrainosus: G43.909

## 2020-08-25 HISTORY — DX: Unspecified convulsions: R56.9

## 2020-08-25 LAB — COMPREHENSIVE METABOLIC PANEL
ALT: 15 U/L (ref 0–44)
AST: 15 U/L (ref 15–41)
Albumin: 4 g/dL (ref 3.5–5.0)
Alkaline Phosphatase: 48 U/L (ref 38–126)
Anion gap: 9 (ref 5–15)
BUN: 8 mg/dL (ref 6–20)
CO2: 25 mmol/L (ref 22–32)
Calcium: 8.4 mg/dL — ABNORMAL LOW (ref 8.9–10.3)
Chloride: 105 mmol/L (ref 98–111)
Creatinine, Ser: 0.94 mg/dL (ref 0.44–1.00)
GFR calc Af Amer: >60 mL/min (ref >60)
GFR calc non Af Amer: >60 mL/min (ref >60)
Glucose, Bld: 113 mg/dL — ABNORMAL HIGH (ref 70–99)
Potassium: 3.2 mmol/L — ABNORMAL LOW (ref 3.5–5.1)
Sodium: 139 mmol/L (ref 135–145)
Total Bilirubin: 0.5 mg/dL (ref 0.3–1.2)
Total Protein: 6.8 g/dL (ref 6.5–8.1)

## 2020-08-25 LAB — CBC WITH DIFFERENTIAL/PLATELET
Abs Immature Granulocytes: 0.01 10*3/uL (ref 0.00–0.07)
Basophils Absolute: 0 10*3/uL (ref 0.0–0.1)
Basophils Relative: 0 %
Eosinophils Absolute: 0.2 10*3/uL (ref 0.0–0.5)
Eosinophils Relative: 3 %
HCT: 40.5 % (ref 36.0–46.0)
Hemoglobin: 13.8 g/dL (ref 12.0–15.0)
Immature Granulocytes: 0 %
Lymphocytes Relative: 36 %
Lymphs Abs: 2.5 10*3/uL (ref 0.7–4.0)
MCH: 28.9 pg (ref 26.0–34.0)
MCHC: 34.1 g/dL (ref 30.0–36.0)
MCV: 84.9 fL (ref 80.0–100.0)
Monocytes Absolute: 0.5 10*3/uL (ref 0.1–1.0)
Monocytes Relative: 7 %
Neutro Abs: 3.8 10*3/uL (ref 1.7–7.7)
Neutrophils Relative %: 54 %
Platelets: 235 10*3/uL (ref 150–400)
RBC: 4.77 MIL/uL (ref 3.87–5.11)
RDW: 12.7 % (ref 11.5–15.5)
WBC: 7 10*3/uL (ref 4.0–10.5)
nRBC: 0 % (ref 0.0–0.2)

## 2020-08-25 MED ORDER — DIPHENHYDRAMINE HCL 50 MG/ML IJ SOLN
50.0000 mg | Freq: Once | INTRAMUSCULAR | Status: AC
  Administered 2020-08-25: 50 mg via INTRAVENOUS
  Filled 2020-08-25: qty 1

## 2020-08-25 MED ORDER — SODIUM CHLORIDE 0.9 % IV BOLUS
1000.0000 mL | Freq: Once | INTRAVENOUS | Status: AC
  Administered 2020-08-25: 1000 mL via INTRAVENOUS

## 2020-08-25 MED ORDER — KETOROLAC TROMETHAMINE 30 MG/ML IJ SOLN
30.0000 mg | Freq: Once | INTRAMUSCULAR | Status: DC
  Filled 2020-08-25: qty 1

## 2020-08-25 MED ORDER — METOCLOPRAMIDE HCL 5 MG/ML IJ SOLN
10.0000 mg | Freq: Once | INTRAMUSCULAR | Status: AC
  Administered 2020-08-25: 10 mg via INTRAVENOUS
  Filled 2020-08-25: qty 2

## 2020-08-25 MED ORDER — DEXAMETHASONE SODIUM PHOSPHATE 10 MG/ML IJ SOLN
10.0000 mg | Freq: Once | INTRAMUSCULAR | Status: AC
  Administered 2020-08-25: 10 mg via INTRAVENOUS
  Filled 2020-08-25: qty 1

## 2020-08-25 NOTE — ED Triage Notes (Signed)
Patient states that she has a migraine that started yesterday. Patient states that she took her medication without improvement.

## 2020-08-25 NOTE — ED Notes (Signed)
Pt states that her mom has been in an accident and that she has to go. Lockie Pares, PA notified and patient signed AMA forms. Patient states grandma is coming to pick her up for ride.

## 2021-07-03 DIAGNOSIS — R350 Frequency of micturition: Secondary | ICD-10-CM | POA: Insufficient documentation

## 2021-07-03 DIAGNOSIS — K219 Gastro-esophageal reflux disease without esophagitis: Secondary | ICD-10-CM | POA: Insufficient documentation

## 2021-07-03 HISTORY — DX: Frequency of micturition: R35.0

## 2021-10-03 DIAGNOSIS — M545 Low back pain, unspecified: Secondary | ICD-10-CM | POA: Insufficient documentation

## 2021-11-04 ENCOUNTER — Emergency Department
Admission: EM | Admit: 2021-11-04 | Discharge: 2021-11-04 | Discharge status: Against Medical Advice | Payer: Medicaid Other | Source: Home / Self Care

## 2021-11-04 ENCOUNTER — Other Ambulatory Visit: Payer: Self-pay

## 2021-11-04 ENCOUNTER — Ambulatory Visit
Admission: EM | Admit: 2021-11-04 | Discharge: 2021-11-04 | Disposition: A | Discharge status: Home / Self Care | Payer: Worker's Compensation | Attending: Internal Medicine | Admitting: Internal Medicine

## 2021-11-04 ENCOUNTER — Ambulatory Visit (INDEPENDENT_AMBULATORY_CARE_PROVIDER_SITE_OTHER): Payer: Worker's Compensation

## 2021-11-04 DIAGNOSIS — S93401A Sprain of unspecified ligament of right ankle, initial encounter: Secondary | ICD-10-CM | POA: Diagnosis not present

## 2021-11-04 DIAGNOSIS — M25571 Pain in right ankle and joints of right foot: Secondary | ICD-10-CM | POA: Diagnosis not present

## 2021-11-04 DIAGNOSIS — W1842XA Slipping, tripping and stumbling without falling due to stepping into hole or opening, initial encounter: Secondary | ICD-10-CM | POA: Diagnosis not present

## 2021-11-04 MED ORDER — IBUPROFEN 600 MG PO TABS: 600.0000 mg | ORAL_TABLET | Freq: Four times a day (QID) | ORAL | 0 refills | Status: DC | PRN

## 2021-11-04 NOTE — ED Triage Notes (Signed)
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

## 2021-11-04 NOTE — ED Provider Notes (Signed)
[facility]    CSN: [id number] Arrival date & time: 11/04/21  1210      History   Chief Complaint Chief Complaint  Patient presents with   Ankle Pain    Right    HPI Karen Hunt is a 30 y.o. female comes to the urgent care with a few hours history of right ankle pain which started after she stepped into a hole and twisted her ankle.  Pain is severe, she is unable to bear weight, pain is aggravated by walking.  Patient denies any relieving factors.It is associated with right ankle swelling. No bruising or numbness of the right ankle.   HPI  Past Medical History:  Diagnosis Date   Hypertension    Migraines    Seizures (HCC)    TIA (transient ischemic attack)     There are no problems to display for this patient.   Past Surgical History:  Procedure Laterality Date   c section     FOOT SURGERY Left    KIDNEY SURGERY      OB History     Gravida  2   Para      Term      Preterm      AB  1   Living  1      SAB  1   IAB      Ectopic      Multiple      Live Births               Home Medications    Prior to Admission medications   Medication Sig Start Date End Date Taking? Authorizing Provider  amitriptyline (ELAVIL) 10 MG tablet Take by mouth. 07/26/21 07/26/22 Yes [provider]  atorvastatin (LIPITOR) 80 MG tablet Take by mouth. 07/26/21 07/26/22 Yes [provider]  famotidine (PEPCID) 20 MG tablet Take 20 mg by mouth 2 (two) times daily. 10/25/21  Yes [provider]  ibuprofen (ADVIL) 600 MG tablet Take 1 tablet (600 mg total) by mouth every 6 (six) hours as needed. 11/04/21  Yes Jaelan Rasheed, Britta Mccreedy, MD  levonorgestrel (MIRENA) 20 MCG/24HR IUD 1 each by Intrauterine route once.   Yes [provider]  albuterol (PROVENTIL HFA;VENTOLIN HFA) 108 (90 BASE) MCG/ACT inhaler Inhale 4-6 puffs by mouth every 4 hours as needed for wheezing, cough, and/or shortness of breath 08/29/15 09/27/19  Loleta Rose, MD    Family History History reviewed. No pertinent family history.  Social History Social History   Tobacco Use   Smoking status: Former    Packs/day: 1.00    Types: Cigarettes   Smokeless tobacco: Never  Substance Use Topics   Alcohol use: Yes   Drug use: No     Allergies   Ciprofloxacin   Review of Systems Review of Systems  Musculoskeletal:  Positive for arthralgias and joint swelling. Negative for back pain and gait problem.  Skin:  Negative for color change and rash.    Physical Exam Triage Vital Signs ED Triage Vitals  Enc Vitals Group     BP 11/04/21 1241 (!) 150/92     Pulse Rate 11/04/21 1241 93     Resp 11/04/21 1241 18     Temp 11/04/21 1241 98.5 F (36.9 C)     Temp Source 11/04/21 1241 Oral     SpO2 11/04/21 1241 96 %     Weight 11/04/21 1240 (!) 350 lb (158.8 kg)     Height 11/04/21 1240 5'  11" (1.803 m)     Head Circumference --      Peak Flow --      Pain Score 11/04/21 1240 8     Pain Loc --      Pain Edu? --      Excl. in GC? --    No data found.  Updated Vital Signs BP (!) 150/92 (BP Location: Left Arm)   Pulse 93   Temp 98.5 F (36.9 C) (Oral)   Resp 18   Ht 5\' 11"  (1.803 m)   Wt (!) 158.8 kg   SpO2 96%   BMI 48.82 kg/m   Visual Acuity Right Eye Distance:   Left Eye Distance:   Bilateral Distance:    Right Eye Near:   Left Eye Near:    Bilateral Near:     Physical Exam Vitals reviewed.  Constitutional:      General: She is in acute distress.  Cardiovascular:     Rate and Rhythm: Normal rate and regular rhythm.  Musculoskeletal:     Comments: Range of motion of the right ankle is limited by pain.  Tenderness and swelling over the lateral malleolus of the right ankle.  Neurological:     Mental Status: She is alert.     UC Treatments / Results  Labs (all labs ordered are listed, but only abnormal results are displayed) Labs Reviewed - No data to display  EKG   Radiology DG Ankle Complete  Right  Result Date: 11/04/2021 CLINICAL DATA:  Stepped in hole and twisted ankle. Right ankle pain and swelling. EXAM: RIGHT ANKLE - COMPLETE 3+ VIEW COMPARISON:  None. FINDINGS: There is no evidence of fracture, dislocation, or joint effusion. There is no evidence of arthropathy or other focal bone abnormality. Soft tissue swelling is seen overlying the lateral malleolus. IMPRESSION: Lateral soft tissue swelling. No evidence of fracture. Electronically Signed   By: 13/05/2021 M.D.   On: 11/04/2021 13:39    Procedures Procedures (including critical care time)  Medications Ordered in UC Medications - No data to display  Initial Impression / Assessment and Plan / UC Course  I have reviewed the triage vital signs and the nursing notes.  Pertinent labs & imaging results that were available during my care of the patient were reviewed by me and considered in my medical decision making (see chart for details).     1.  Right ankle sprain, moderate severity, X-ray of the right ankle significant for soft tissue swelling with no evidence of fracture Cam boot Elevation and nonweightbearing for the next 48 hours Gentle range of motion exercises after that NSAIDs as needed for pain If symptoms worsen please return to urgent care to be reevaluated. Final Clinical Impressions(s) / UC Diagnoses   Final diagnoses:  Moderate right ankle sprain, initial encounter     Discharge Instructions      Rest and elevation of the right ankle for the next couple of days After rest.-Gentle range of motion exercises Ibuprofen as needed for pain If symptoms worsen please return to urgent care to be reevaluated Icing of the right ankle X-rays negative for fracture.    ED Prescriptions     Medication Sig Dispense Auth. Provider   ibuprofen (ADVIL) 600 MG tablet Take 1 tablet (600 mg total) by mouth every 6 (six) hours as needed. 30 tablet Wilton Thrall, 13/05/2021, MD      PDMP not reviewed this encounter.    Britta Mccreedy, MD 11/04/21 1415

## 2021-11-04 NOTE — Discharge Instructions (Signed)
Rest and elevation of the right ankle for the next couple of days After rest.-Gentle range of motion exercises Ibuprofen as needed for pain If symptoms worsen please return to urgent care to be reevaluated Icing of the right ankle X-rays negative for fracture.

## 2022-03-30 ENCOUNTER — Ambulatory Visit
Admission: EM | Admit: 2022-03-30 | Discharge: 2022-03-30 | Disposition: A | Discharge status: Home / Self Care | Payer: Medicaid Other

## 2022-03-30 ENCOUNTER — Encounter: Payer: Self-pay | Admitting: Emergency Medicine

## 2022-03-30 ENCOUNTER — Other Ambulatory Visit: Payer: Self-pay

## 2022-03-30 DIAGNOSIS — M5441 Lumbago with sciatica, right side: Secondary | ICD-10-CM

## 2022-03-30 DIAGNOSIS — W19XXXA Unspecified fall, initial encounter: Secondary | ICD-10-CM | POA: Diagnosis not present

## 2022-03-30 DIAGNOSIS — M5442 Lumbago with sciatica, left side: Secondary | ICD-10-CM | POA: Diagnosis not present

## 2022-03-30 MED ORDER — KETOROLAC TROMETHAMINE 60 MG/2ML IM SOLN
30.0000 mg | Freq: Once | INTRAMUSCULAR | Status: AC
  Administered 2022-03-30: 30 mg via INTRAMUSCULAR

## 2022-03-30 MED ORDER — NAPROXEN 500 MG PO TABS: 500.0000 mg | ORAL_TABLET | Freq: Two times a day (BID) | ORAL | 0 refills | Status: DC

## 2022-03-30 MED ORDER — CYCLOBENZAPRINE HCL 10 MG PO TABS: 10.0000 mg | ORAL_TABLET | Freq: Two times a day (BID) | ORAL | 0 refills | Status: DC | PRN

## 2022-03-30 MED ORDER — OXYCODONE HCL 5 MG PO TABS: 5.0000 mg | ORAL_TABLET | Freq: Four times a day (QID) | ORAL | 0 refills | Status: AC | PRN

## 2022-03-30 NOTE — ED Provider Notes (Signed)
?[facility] ? ? ? ?CSN: [id number] ?Arrival date & time: 03/30/22  1347 ? ? ?  ? ?History   ?Chief Complaint ?Chief Complaint  ?Patient presents with  ? Back Pain  ? Fall  ? ? ?HPI ?Karen Hunt is a 31 y.o. female.  ? ?Patient presents with lower back pain radiating to bilateral lower legs with associated tingling after fall. Was rushing out of her home headed to work, slipped on her patio and fell landing directly onto back. Required assistance to stand. Range of motion intact but elicits pain. Able to control bladder and bowels. Has attempted use of hydrocodone, ice and heat with were minimally helpful.  ? ?Past Medical History:  ?Diagnosis Date  ? Hypertension   ? Migraines   ? Seizures (HCC)   ? TIA (transient ischemic attack)   ? ? ?There are no problems to display for this patient. ? ? ?Past Surgical History:  ?Procedure Laterality Date  ? c section    ? FOOT SURGERY Left   ? KIDNEY SURGERY    ? ? ?OB History   ? ? Gravida  ?2  ? Para  ?   ? Term  ?   ? Preterm  ?   ? AB  ?1  ? Living  ?1  ?  ? ? SAB  ?1  ? IAB  ?   ? Ectopic  ?   ? Multiple  ?   ? Live Births  ?   ?   ?  ?  ? ? ? ?Home Medications   ? ?Prior to Admission medications   ?Medication Sig Start Date End Date Taking? Authorizing Provider  ?atorvastatin (LIPITOR) 80 MG tablet Take by mouth. 07/26/21 07/26/22 Yes [provider]  ?famotidine (PEPCID) 20 MG tablet Take 20 mg by mouth 2 (two) times daily. 10/25/21  Yes [provider]  ?levonorgestrel (MIRENA) 20 MCG/24HR IUD 1 each by Intrauterine route once.   Yes [provider]  ?topiramate (TOPAMAX) 25 MG tablet Take 25 mg by mouth 2 (two) times daily. 03/23/22  Yes [provider]  ?amitriptyline (ELAVIL) 10 MG tablet Take by mouth. 07/26/21 07/26/22  [provider]  ?ibuprofen (ADVIL) 600 MG tablet Take 1 tablet (600 mg total) by mouth every 6 (six) hours as needed. 11/04/21   Merrilee Jansky, MD  ?albuterol (PROVENTIL HFA;VENTOLIN HFA)  108 (90 BASE) MCG/ACT inhaler Inhale 4-6 puffs by mouth every 4 hours as needed for wheezing, cough, and/or shortness of breath 08/29/15 09/27/19  Loleta Rose, MD  ? ? ?Family History ?No family history on file. ? ?Social History ?Social History  ? ?Tobacco Use  ? Smoking status: Every Day  ?  Packs/day: 1.00  ?  Types: Cigarettes  ? Smokeless tobacco: Never  ?Vaping Use  ? Vaping Use: Never used  ?Substance Use Topics  ? Alcohol use: Yes  ? Drug use: No  ? ? ? ?Allergies   ?Ciprofloxacin ? ? ?Review of Systems ?Review of Systems  ?Constitutional: Negative.   ?Respiratory: Negative.    ?Cardiovascular: Negative.   ?Musculoskeletal:  Positive for back pain. Negative for arthralgias, gait problem, joint swelling, myalgias, neck pain and neck stiffness.  ?Skin: Negative.   ?Neurological: Negative.   ? ? ?Physical Exam ?Triage Vital Signs ?ED Triage Vitals  ?Enc Vitals Group  ?   BP 03/30/22 1401 136/89  ?   Pulse Rate 03/30/22 1401 70  ?   Resp 03/30/22 1401 18  ?  Temp 03/30/22 1401 98.1 ?F (36.7 ?C)  ?   Temp Source 03/30/22 1401 Oral  ?   SpO2 03/30/22 1401 98 %  ?   Weight 03/30/22 1358 (!) 350 lb 1.5 oz (158.8 kg)  ?   Height 03/30/22 1358 5\' 11"  (1.803 m)  ?   Head Circumference --   ?   Peak Flow --   ?   Pain Score 03/30/22 1358 9  ?   Pain Loc --   ?   Pain Edu? --   ?   Excl. in GC? --   ? ?No data found. ? ?Updated Vital Signs ?BP 136/89 (BP Location: Left Arm)   Pulse 70   Temp 98.1 ?F (36.7 ?C) (Oral)   Resp 18   Ht 5\' 11"  (1.803 m)   Wt (!) 350 lb 1.5 oz (158.8 kg)   SpO2 98%   BMI 48.83 kg/m?  ? ?Visual Acuity ?Right Eye Distance:   ?Left Eye Distance:   ?Bilateral Distance:   ? ?Right Eye Near:   ?Left Eye Near:    ?Bilateral Near:    ? ?Physical Exam ?Constitutional:   ?   Appearance: Normal appearance.  ?HENT:  ?   Head: Normocephalic.  ?Eyes:  ?   Extraocular Movements: Extraocular movements intact.  ?Pulmonary:  ?   Effort: Pulmonary effort is normal.  ?Musculoskeletal:  ?   Comments:  Tenderness with swelling or ecchymosis along the midline and bilateral latissimus dorsi, rom intact, able to stand straight and walk without assistance   ?Skin: ?   General: Skin is warm and dry.  ?Neurological:  ?   Mental Status: She is alert and oriented to person, place, and time. Mental status is at baseline.  ?Psychiatric:     ?   Mood and Affect: Mood normal.     ?   Behavior: Behavior normal.  ? ? ? ?UC Treatments / Results  ?Labs ?(all labs ordered are listed, but only abnormal results are displayed) ?Labs Reviewed - No data to display ? ?EKG ? ? ?Radiology ?No results found. ? ?Procedures ?Procedures (including critical care time) ? ?Medications Ordered in UC ?Medications - No data to display ? ?Initial Impression / Assessment and Plan / UC Course  ?I have reviewed the triage vital signs and the nursing notes. ? ?Pertinent labs & imaging results that were available during my care of the patient were reviewed by me and considered in my medical decision making (see chart for details). ? ?Acute bilateral low back pain with bilateral sciatica ?Fall, initial encounter ? ?We will defer imaging at this time, discussed with patient, will move forward with treatment of symptoms as etiology is most likely inflammatory and muscular related to to fall, Toradol injection given in office, prescribed naproxen to be used twice daily for 7 days, Flexeril twice daily to be used as needed and oxycodone immediate release for severe pain, PDMP reviewed, low risk, to be dispensed 20 tablets, recommended RICE, heat, pillows for support, daily stretching and activity as tolerated, given walker referral to orthopedics for persisting symptoms ?Final Clinical Impressions(s) / UC Diagnoses  ? ?Final diagnoses:  ?None  ? ?Discharge Instructions   ?None ?  ? ?ED Prescriptions   ?None ?  ? ?PDMP not reviewed this encounter. ?  ?05/30/22, NP ?03/30/22 1500 ? ?

## 2022-03-30 NOTE — Discharge Instructions (Signed)
Your pain is most likely caused by irritation to the muscles or ligaments.  ? ?Starting tomorrow take naproxen twice daily for 7 days then as needed, this is to help reduce inflammation which is caused by injury ? ?You may use Flexeril twice daily as needed for additional comfort, be mindful this medication may make you drowsy ? ?For severe pain you may use oxycodone every 6 hours, use sparingly as you have only been dispensed 20 tablets ? ?You may use heating pad in 15 minute intervals as needed for additional comfort, within the first 2-3 days you may find comfort in using ice in 10-15 minutes over affected area ? ?Begin stretching affected area daily for 10 minutes as tolerated to further loosen muscles  ? ?When lying down place pillow underneath and between knees for support ? ?Can try sleeping without pillow on firm mattress  ? ?Practice good posture: head back, shoulders back, chest forward, pelvis back and weight distributed evenly on both legs ? ?If pain persist after recommended treatment or reoccurs if may be beneficial to follow up with orthopedic specialist for evaluation, this doctor specializes in the bones and can manage your symptoms long-term with options such as but not limited to imaging, medications or physical therapy  ?  ?

## 2022-03-30 NOTE — ED Triage Notes (Signed)
Pt states she fell on her back deck about 9:30 this morning. She is now having lower back pain and pain is radiating down bilateral leg but left is worse. She states her legs feel weak. She has taken her grandmothers pain pill about 4 hours ago.  ?

## 2022-04-18 ENCOUNTER — Ambulatory Visit
Admission: EM | Admit: 2022-04-18 | Discharge: 2022-04-18 | Disposition: A | Discharge status: Home / Self Care | Payer: Medicaid Other

## 2022-04-18 DIAGNOSIS — J40 Bronchitis, not specified as acute or chronic: Secondary | ICD-10-CM

## 2022-04-18 MED ORDER — ALBUTEROL SULFATE HFA 108 (90 BASE) MCG/ACT IN AERS: 2.0000 | INHALATION_SPRAY | RESPIRATORY_TRACT | 0 refills | Status: DC | PRN

## 2022-04-18 MED ORDER — PREDNISONE 20 MG PO TABS: 40.0000 mg | ORAL_TABLET | Freq: Every day | ORAL | 0 refills | Status: DC

## 2022-04-18 MED ORDER — PROMETHAZINE-DM 6.25-15 MG/5ML PO SYRP: 5.0000 mL | ORAL_SOLUTION | Freq: Four times a day (QID) | ORAL | 0 refills | Status: DC | PRN

## 2022-04-18 MED ORDER — ALBUTEROL SULFATE (2.5 MG/3ML) 0.083% IN NEBU
2.5000 mg | INHALATION_SOLUTION | Freq: Once | RESPIRATORY_TRACT | Status: AC
  Administered 2022-04-18: 2.5 mg via RESPIRATORY_TRACT

## 2022-04-18 MED ORDER — BENZONATATE 100 MG PO CAPS: 100.0000 mg | ORAL_CAPSULE | Freq: Three times a day (TID) | ORAL | 0 refills | Status: DC

## 2022-04-18 NOTE — ED Triage Notes (Signed)
Pt presents with dry cough, SOB x 3 days.  Difficulty sleeping d/t cough.  Reports chest discomfort and HA due to cough.  Has used Mucinex and Robitussin as well as whiskey for cough/sleep.  ?

## 2022-04-18 NOTE — Discharge Instructions (Signed)
Today you are being treated for inflammation to your upper airways  Begin use of prednisone every morning with food for 5 days to help reduce inflammation   May use albuterol inhaler taking 2 puffs every 4 hours as needed to help calm shortness of breath and wheezing  May use promethazine DM for coughing and additional comfort, be mindful this medication may make you drowsy  For worsening signs of breathing please go to the nearest emergency department for evaluation  In addition:  Maintaining adequate hydration may help to thin secretions and soothe the respiratory mucosa   Warm Liquids- Ingestion of warm liquids may have a soothing effect on the respiratory mucosa, increase the flow of nasal mucus, and loosen respiratory secretions, making them easier to remove  May try honey (2.5 to 5 mL [0.5 to 1 teaspoon]) can be given straight or diluted in liquid (juice). Corn syrup may be substituted if honey is not available.    

## 2022-04-18 NOTE — ED Provider Notes (Signed)
?[facility] ? ? ? ?CSN: [id number] ?Arrival date & time: 04/18/22  1026 ? ? ?  ? ?History   ?Chief Complaint ?Chief Complaint  ?Patient presents with  ? Cough  ? Shortness of Breath  ? ? ?HPI ?Karen Hunt is a 31 y.o. female.  ? ?Patient presents with nonproductive cough, wheezing, shortness of breath at rest worsened by exertion for 3 days.  Possible low-grade fever 1 day ago.  Symptoms worsened overnight with cough interfering with sleep.  Tolerating food and liquids.  No known sick contacts.  Daily smoker, last cigarette this morning.  Has attempted use of Mucinex and Robitussin which has been ineffective.  Denies chills, body aches, nasal congestion, rhinorrhea, sore throat, ear pain, headaches, chest pain or tightness. ? ? ?Past Medical History:  ?Diagnosis Date  ? Hypertension   ? Migraines   ? Seizures (HCC)   ? TIA (transient ischemic attack)   ? ? ?There are no problems to display for this patient. ? ? ?Past Surgical History:  ?Procedure Laterality Date  ? c section    ? FOOT SURGERY Left   ? KIDNEY SURGERY    ? ? ?OB History   ? ? Gravida  ?2  ? Para  ?   ? Term  ?   ? Preterm  ?   ? AB  ?1  ? Living  ?1  ?  ? ? SAB  ?1  ? IAB  ?   ? Ectopic  ?   ? Multiple  ?   ? Live Births  ?   ?   ?  ?  ? ? ? ?Home Medications   ? ?Prior to Admission medications   ?Medication Sig Start Date End Date Taking? Authorizing Provider  ?atorvastatin (LIPITOR) 80 MG tablet Take by mouth. 07/26/21 07/26/22 Yes [provider]  ?cyanocobalamin 100 MCG tablet Take 100 mcg by mouth daily.   Yes [provider]  ?famotidine (PEPCID) 20 MG tablet Take 20 mg by mouth 2 (two) times daily. 10/25/21  Yes [provider]  ?ibuprofen (ADVIL) 600 MG tablet Take 1 tablet (600 mg total) by mouth every 6 (six) hours as needed. 11/04/21  Yes Lamptey, Britta MccreedyPhilip O, MD  ?levonorgestrel (MIRENA) 20 MCG/24HR IUD 1 each by Intrauterine route once.   Yes [provider]  ?magnesium 30 MG tablet Take 30  mg by mouth 2 (two) times daily.   Yes [provider]  ?topiramate (TOPAMAX) 25 MG tablet Take 25 mg by mouth 2 (two) times daily. 03/23/22  Yes [provider]  ?amitriptyline (ELAVIL) 10 MG tablet Take by mouth. 07/26/21 07/26/22  [provider]  ?cyclobenzaprine (FLEXERIL) 10 MG tablet Take 1 tablet (10 mg total) by mouth 2 (two) times daily as needed for muscle spasms. 03/30/22   Valinda HoarWhite, Chaunte Hornbeck R, NP  ?naproxen (NAPROSYN) 500 MG tablet Take 1 tablet (500 mg total) by mouth 2 (two) times daily. 03/30/22   Valinda HoarWhite, Sherron Mapp R, NP  ?albuterol (PROVENTIL HFA;VENTOLIN HFA) 108 (90 BASE) MCG/ACT inhaler Inhale 4-6 puffs by mouth every 4 hours as needed for wheezing, cough, and/or shortness of breath 08/29/15 09/27/19  Loleta RoseForbach, Cory, MD  ? ? ?Family History ?No family history on file. ? ?Social History ?Social History  ? ?Tobacco Use  ? Smoking status: Every Day  ?  Packs/day: 1.00  ?  Types: Cigarettes  ? Smokeless tobacco: Never  ?Vaping Use  ? Vaping Use: Never used  ?Substance Use Topics  ?  Alcohol use: Yes  ?  Comment: rare  ? Drug use: No  ? ? ? ?Allergies   ?Ciprofloxacin ? ? ?Review of Systems ?Review of Systems  ?Constitutional: Negative.   ?HENT: Negative.    ?Respiratory:  Positive for cough, shortness of breath and wheezing. Negative for apnea, choking, chest tightness and stridor.   ?Cardiovascular: Negative.   ?Gastrointestinal: Negative.   ?Skin: Negative.   ?Neurological:  Positive for headaches. Negative for dizziness, tremors, seizures, syncope, facial asymmetry, speech difficulty, weakness, light-headedness and numbness.  ? ? ?Physical Exam ?Triage Vital Signs ?ED Triage Vitals  ?Enc Vitals Group  ?   BP 04/18/22 1118 (!) 157/104  ?   Pulse Rate 04/18/22 1118 80  ?   Resp 04/18/22 1118 (!) 28  ?   Temp 04/18/22 1118 98.4 ?F (36.9 ?C)  ?   Temp Source 04/18/22 1118 Oral  ?   SpO2 04/18/22 1118 97 %  ?   Weight --   ?   Height --   ?   Head Circumference --   ?   Peak Flow --   ?    Pain Score 04/18/22 1119 6  ?   Pain Loc --   ?   Pain Edu? --   ?   Excl. in GC? --   ? ?No data found. ? ?Updated Vital Signs ?BP (!) 157/104 (BP Location: Left Wrist)   Pulse 80   Temp 98.4 ?F (36.9 ?C) (Oral)   Resp (!) 28   SpO2 97%  ? ?Visual Acuity ?Right Eye Distance:   ?Left Eye Distance:   ?Bilateral Distance:   ? ?Right Eye Near:   ?Left Eye Near:    ?Bilateral Near:    ? ?Physical Exam ?Constitutional:   ?   Appearance: Normal appearance. She is well-developed.  ?HENT:  ?   Right Ear: Tympanic membrane, ear canal and external ear normal.  ?   Left Ear: Tympanic membrane, ear canal and external ear normal.  ?   Nose: Nose normal.  ?   Mouth/Throat:  ?   Mouth: Mucous membranes are moist.  ?   Pharynx: Oropharynx is clear.  ?Eyes:  ?   Extraocular Movements: Extraocular movements intact.  ?Cardiovascular:  ?   Rate and Rhythm: Normal rate and regular rhythm.  ?   Pulses: Normal pulses.  ?   Heart sounds: Normal heart sounds.  ?Pulmonary:  ?   Breath sounds: Wheezing present.  ?   Comments: Labored breathing  ?Musculoskeletal:  ?   Cervical back: Normal range of motion.  ?Skin: ?   General: Skin is warm and dry.  ?Neurological:  ?   Mental Status: She is alert and oriented to person, place, and time. Mental status is at baseline.  ?Psychiatric:     ?   Mood and Affect: Mood normal.     ?   Behavior: Behavior normal.  ? ? ? ?UC Treatments / Results  ?Labs ?(all labs ordered are listed, but only abnormal results are displayed) ?Labs Reviewed - No data to display ? ?EKG ? ? ?Radiology ?No results found. ? ?Procedures ?Procedures (including critical care time) ? ?Medications Ordered in UC ?Medications  ?albuterol (PROVENTIL) (2.5 MG/3ML) 0.083% nebulizer solution 2.5 mg (has no administration in time range)  ? ? ?Initial Impression / Assessment and Plan / UC Course  ?I have reviewed the triage vital signs and the nursing notes. ? ?Pertinent labs & imaging results that were available during my  care of the  patient were reviewed by me and considered in my medical decision making (see chart for details). ? ?Bronchitis ? ?Vital signs are stable, O2 saturation ration 97% on room air, respirations 28 with associated labored breathing with wheezing to auscultation on initial assessment, albuterol nebulizer given in office, patient endorses improvement with symptoms on reevaluation, lungs at this time are clear, prednisone and albuterol inhaler prescribed for outpatient prescribed for recent cough, given strict precautions that any worsening signs of breathing the patient will need to go to the nearest emergency department for further evaluation and management ?Final Clinical Impressions(s) / UC Diagnoses  ? ?Final diagnoses:  ?None  ? ?Discharge Instructions   ?None ?  ? ?ED Prescriptions   ?None ?  ? ?PDMP not reviewed this encounter. ?  ?Valinda Hoar, NP ?04/18/22 1200 ? ?

## 2022-05-13 ENCOUNTER — Ambulatory Visit
Admission: EM | Admit: 2022-05-13 | Discharge: 2022-05-13 | Disposition: A | Discharge status: Home / Self Care | Payer: Medicaid Other

## 2022-05-13 DIAGNOSIS — W540XXA Bitten by dog, initial encounter: Secondary | ICD-10-CM

## 2022-05-13 DIAGNOSIS — S71131A Puncture wound without foreign body, right thigh, initial encounter: Secondary | ICD-10-CM

## 2022-05-13 DIAGNOSIS — S71132A Puncture wound without foreign body, left thigh, initial encounter: Secondary | ICD-10-CM

## 2022-05-13 MED ORDER — OXYCODONE HCL 5 MG PO TABS: 5.0000 mg | ORAL_TABLET | Freq: Four times a day (QID) | ORAL | 0 refills | Status: AC | PRN

## 2022-05-13 MED ORDER — AMOXICILLIN-POT CLAVULANATE 875-125 MG PO TABS: 1.0000 | ORAL_TABLET | Freq: Two times a day (BID) | ORAL | 0 refills | Status: DC

## 2022-05-13 NOTE — ED Triage Notes (Signed)
Patient is here for "Dog Bite" (1 on inner left thigh, another on inner right).  DOI: 10272536. Time: "12N". 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  ?

## 2022-05-13 NOTE — ED Provider Notes (Signed)
[facility]    CSN: [id number] Arrival date & time: 05/13/22  1314      History   Chief Complaint Chief Complaint  Patient presents with   Animal Bite    HPI Karen Hunt is a 31 y.o. female.   Patient presents with dog bites to the right and left upper leg occurring today.  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  Animal has been obtained by proper authorities for 10-day monitoring.  Has proof of rabies vaccinations, dog is up-to-date.   Past Medical History:  Diagnosis Date   Hypertension    Migraines    Seizures (HCC)    TIA (transient ischemic attack)     Patient Active Problem List   Diagnosis Date Noted   Acute bilateral low back pain 10/03/2021   Gastroesophageal reflux disease 07/03/2021   Increased frequency of urination 07/03/2021   Class 3 severe obesity due to excess calories with body mass index (BMI) of 45.0 to 49.9 in adult [facility]) 12/28/2019   History of ischemic stroke 11/02/2019   Seizure disorder as sequela of cerebrovascular accident (HCC) 11/02/2019   Tobacco use disorder 10/22/2019   Nonintractable episodic headache 10/19/2019   Healthcare maintenance 08/17/2018   Recurrent major depressive disorder, in partial remission (HCC) 08/17/2018   Recurrent acute suppurative otitis media of right ear without spontaneous rupture of tympanic membrane 10/03/2015   Hypertension 09/22/2011    Past Surgical History:  Procedure Laterality Date   c section     FOOT SURGERY Left    KIDNEY SURGERY      OB History     Gravida  2   Para      Term      Preterm      AB  1   Living  1      SAB  1   IAB      Ectopic      Multiple      Live Births               Home Medications    Prior to Admission medications   Medication Sig Start Date End Date Taking? Authorizing Provider  amoxicillin-clavulanate (AUGMENTIN) 875-125 MG tablet Take 1 tablet by mouth every 12  (twelve) hours. 05/13/22  Yes Valinda Hoar, NP  atorvastatin (LIPITOR) 80 MG tablet Take by mouth. 07/26/21 07/26/22 Yes [provider]  cyanocobalamin 100 MCG tablet Take 100 mcg by mouth daily.   Yes [provider]  famotidine (PEPCID) 20 MG tablet Take by mouth. 07/03/21 07/03/22 Yes [provider]  levonorgestrel (MIRENA) 20 MCG/24HR IUD 1 each by Intrauterine route once.   Yes [provider]  oxyCODONE (ROXICODONE) 5 MG immediate release tablet Take 1 tablet (5 mg total) by mouth every 6 (six) hours as needed for up to 3 days for severe pain. 05/13/22 05/16/22 Yes Brogan England R, NP  topiramate (TOPAMAX) 25 MG tablet Take 25 mg by mouth 2 (two) times daily. 03/23/22  Yes [provider]  albuterol (VENTOLIN HFA) 108 (90 Base) MCG/ACT inhaler Inhale 2 puffs into the lungs every 4 (four) hours as needed for wheezing or shortness of breath. 04/18/22   Anatalia Kronk, Elita Boone, NP  benzonatate (TESSALON) 100 MG capsule Take 1 capsule (100 mg total) by mouth every 8 (eight) hours. 04/18/22   Valinda Hoar, NP  ibuprofen (ADVIL) 600 MG tablet Take 1 tablet (  600 mg total) by mouth every 6 (six) hours as needed. 11/04/21   LampteyBritta Mccreedy, MD  magnesium 30 MG tablet Take 30 mg by mouth 2 (two) times daily.    [provider]  naloxone [facility]) nasal spray 4 mg/0.1 mL SMARTSIG:Both Nares 03/30/22   [provider]  nitrofurantoin, macrocrystal-monohydrate, (MACROBID) 100 MG capsule Take 100 mg by mouth every 12 (twelve) hours. 12/25/21   [provider]  phenazopyridine (PYRIDIUM) 200 MG tablet Take 200 mg by mouth 3 (three) times daily as needed. 12/25/21   [provider]  predniSONE (DELTASONE) 20 MG tablet Take 2 tablets (40 mg total) by mouth daily. 04/18/22   Renesmay Nesbitt, Elita Boone, NP  promethazine-dextromethorphan (PROMETHAZINE-DM) 6.25-15 MG/5ML syrup Take 5 mLs by mouth 4 (four) times daily as needed. 04/18/22   Marcedes Tech,  Elita Boone, NP  sulfamethoxazole-trimethoprim (BACTRIM DS) 800-160 MG tablet Take 1 tablet by mouth 2 (two) times daily. 12/28/21   [provider]  UBRELVY 50 MG TABS Take by mouth. 01/18/22   [provider]    Family History No family history on file.  Social History Social History   Tobacco Use   Smoking status: Every Day    Packs/day: 1.00    Types: Cigarettes   Smokeless tobacco: Never  Vaping Use   Vaping Use: Never used  Substance Use Topics   Alcohol use: Yes    Comment: rare   Drug use: Yes    Types: Marijuana     Allergies   Ciprofloxacin   Review of Systems Review of Systems  Constitutional: Negative.   Respiratory: Negative.    Cardiovascular: Negative.   Skin:  Positive for wound. Negative for color change, pallor and rash.  Neurological: Negative.     Physical Exam Triage Vital Signs ED Triage Vitals  Enc Vitals Group     BP 05/13/22 1337 (!) 129/92     Pulse Rate 05/13/22 1337 92     Resp 05/13/22 1337 20     Temp 05/13/22 1337 98.2 F (36.8 C)     Temp Source 05/13/22 1337 Oral     SpO2 05/13/22 1337 97 %     Weight 05/13/22 1335 (!) 350 lb (158.8 kg)     Height 05/13/22 1335 5\' 11"  (1.803 m)     Head Circumference --      Peak Flow --      Pain Score 05/13/22 1332 6     Pain Loc --      Pain Edu? --      Excl. in GC? --    No data found.  Updated Vital Signs BP (!) 129/92 (BP Location: Right Arm)   Pulse 92   Temp 98.2 F (36.8 C) (Oral)   Resp 20   Ht 5\' 11"  (1.803 m)   Wt (!) 350 lb (158.8 kg)   LMP  (LMP Unknown)   SpO2 97%   BMI 48.82 kg/m   Visual Acuity Right Eye Distance:   Left Eye Distance:   Bilateral Distance:    Right Eye Near:   Left Eye Near:    Bilateral Near:     Physical Exam Constitutional:      Appearance: Normal appearance.  HENT:     Head: Normocephalic.  Eyes:     Extraocular Movements: Extraocular movements intact.  Pulmonary:     Effort: Pulmonary effort is normal.   Skin:    Comments: Defer to photo  Neurological:     Mental  Status: She is alert.  Psychiatric:        Mood and Affect: Mood normal.        Behavior: Behavior normal.        UC Treatments / Results  Labs (all labs ordered are listed, but only abnormal results are displayed) Labs Reviewed - No data to display  EKG   Radiology No results found.  Procedures Procedures (including critical care time)  Medications Ordered in UC Medications - No data to display  Initial Impression / Assessment and Plan / UC Course  I have reviewed the triage vital signs and the nursing notes.  Pertinent labs & imaging results that were available during my care of the patient were reviewed by me and considered in my medical decision making (see chart for details).  Dog bite, initial encounter Puncture wound of left thigh, initial encounter Puncture wound of right thigh, initial encounter  Puncture wound to have already begun to heal and have clotted over the opening, bruising present in her bilateral thighs, patient able to show proof of vaccination for canine, area cleansed with wound cleanser, left open to air, Augmentin 7-day course prescribed prophylactically and oxycodone IR prescribed for management of pain as patient cannot even tolerate site being touched, PDMP reviewed, low risk, may follow-up at the urgent care as needed for pain nonhealing site Final Clinical Impressions(s) / UC Diagnoses   Final diagnoses:  Dog bite, initial encounter  Puncture wound of left thigh, initial encounter  Puncture wound of right thigh, initial encounter     Discharge Instructions      Wounds have already closed on their own and will continue to monitor healing, you do have bruising that will get better with time  As dog is up-to-date on rabies vaccine, you will not need vaccinations  Take Augmentin twice daily for the next 7 days as a prophylactic measure to prevent infection  Attempt use of  Tylenol or ibuprofen for management of pain, you may use oxycodone every 6 hours as needed for severe pain, you will only be dispensed 12 tablets  For the next 24 to 48 hours you may apply ice to the affected areas in 10 to 15-minute intervals to help reduce swelling, then you may switch over to heat if that is comforting  You may follow-up with urgent care as needed   ED Prescriptions     Medication Sig Dispense Auth. Provider   amoxicillin-clavulanate (AUGMENTIN) 875-125 MG tablet Take 1 tablet by mouth every 12 (twelve) hours. 14 tablet Gilberte Gorley R, NP   oxyCODONE (ROXICODONE) 5 MG immediate release tablet Take 1 tablet (5 mg total) by mouth every 6 (six) hours as needed for up to 3 days for severe pain. 12 tablet Tiandre Teall, Elita Boone, NP      I have reviewed the PDMP during this encounter.   Valinda Hoar, NP 05/13/22 1501

## 2022-05-13 NOTE — ED Triage Notes (Signed)
Animal control contacted, took dog for 10 day hold. UTD with rabies. Waiting on return call.  ?

## 2022-05-13 NOTE — Discharge Instructions (Signed)
Wounds have already closed on their own and will continue to monitor healing, you do have bruising that will get better with time ? ?As dog is up-to-date on rabies vaccine, you will not need vaccinations ? ?Take Augmentin twice daily for the next 7 days as a prophylactic measure to prevent infection ? ?Attempt use of Tylenol or ibuprofen for management of pain, you may use oxycodone every 6 hours as needed for severe pain, you will only be dispensed 12 tablets ? ?For the next 24 to 48 hours you may apply ice to the affected areas in 10 to 15-minute intervals to help reduce swelling, then you may switch over to heat if that is comforting ? ?You may follow-up with urgent care as needed ?

## 2022-06-09 ENCOUNTER — Emergency Department
Admission: EM | Admit: 2022-06-09 | Discharge: 2022-06-09 | Disposition: A | Discharge status: Home / Self Care | Payer: Medicaid Other | Attending: Emergency Medicine | Admitting: Emergency Medicine

## 2022-06-09 ENCOUNTER — Emergency Department: Payer: Medicaid Other

## 2022-06-09 DIAGNOSIS — G43809 Other migraine, not intractable, without status migrainosus: Secondary | ICD-10-CM

## 2022-06-09 DIAGNOSIS — R519 Headache, unspecified: Secondary | ICD-10-CM | POA: Diagnosis not present

## 2022-06-09 DIAGNOSIS — R1110 Vomiting, unspecified: Secondary | ICD-10-CM | POA: Diagnosis not present

## 2022-06-09 LAB — BASIC METABOLIC PANEL
Anion gap: 5 (ref 5–15)
BUN: 16 mg/dL (ref 6–20)
CO2: 23 mmol/L (ref 22–32)
Calcium: 8.7 mg/dL — ABNORMAL LOW (ref 8.9–10.3)
Chloride: 111 mmol/L (ref 98–111)
Creatinine, Ser: 0.87 mg/dL (ref 0.44–1.00)
GFR, Estimated: >60 mL/min (ref >60)
Glucose, Bld: 98 mg/dL (ref 70–99)
Potassium: 4.4 mmol/L (ref 3.5–5.1)
Sodium: 139 mmol/L (ref 135–145)

## 2022-06-09 LAB — CBC
HCT: 43.6 % (ref 36.0–46.0)
Hemoglobin: 14 g/dL (ref 12.0–15.0)
MCH: 29 pg (ref 26.0–34.0)
MCHC: 32.1 g/dL (ref 30.0–36.0)
MCV: 90.3 fL (ref 80.0–100.0)
Platelets: 208 10*3/uL (ref 150–400)
RBC: 4.83 MIL/uL (ref 3.87–5.11)
RDW: 12.6 % (ref 11.5–15.5)
WBC: 6.4 10*3/uL (ref 4.0–10.5)
nRBC: 0 % (ref 0.0–0.2)

## 2022-06-09 LAB — MAGNESIUM: Magnesium: 2.3 mg/dL (ref 1.7–2.4)

## 2022-06-09 LAB — HCG, QUANTITATIVE, PREGNANCY: hCG, Beta Chain, Quant, S: <1 m[IU]/mL (ref <5)

## 2022-06-09 MED ORDER — PROCHLORPERAZINE EDISYLATE 10 MG/2ML IJ SOLN
10.0000 mg | INTRAMUSCULAR | Status: AC
  Administered 2022-06-09: 10 mg via INTRAVENOUS
  Filled 2022-06-09: qty 2

## 2022-06-09 MED ORDER — BUTALBITAL-APAP-CAFFEINE 50-325-40 MG PO TABS: 1.0000 | ORAL_TABLET | Freq: Four times a day (QID) | ORAL | 0 refills | Status: DC | PRN

## 2022-06-09 MED ORDER — GADOBUTROL 1 MMOL/ML IV SOLN
10.0000 mL | Freq: Once | INTRAVENOUS | Status: AC | PRN
Start: 2022-06-09 — End: 2022-06-09
  Administered 2022-06-09: 10 mL via INTRAVENOUS

## 2022-06-09 MED ORDER — LORAZEPAM 2 MG/ML IJ SOLN
1.0000 mg | Freq: Once | INTRAMUSCULAR | Status: AC | PRN
  Administered 2022-06-09: 1 mg via INTRAVENOUS
  Filled 2022-06-09: qty 1

## 2022-06-09 MED ORDER — DEXAMETHASONE SODIUM PHOSPHATE 10 MG/ML IJ SOLN
10.0000 mg | Freq: Once | INTRAMUSCULAR | Status: AC
Start: 2022-06-09 — End: 2022-06-09
  Administered 2022-06-09: 10 mg via INTRAVENOUS
  Filled 2022-06-09: qty 1

## 2022-06-09 MED ORDER — DIPHENHYDRAMINE HCL 50 MG/ML IJ SOLN
12.5000 mg | Freq: Once | INTRAMUSCULAR | Status: AC
  Administered 2022-06-09: 12.5 mg via INTRAVENOUS
  Filled 2022-06-09: qty 1

## 2022-06-09 NOTE — Discharge Instructions (Addendum)
You have been seen in the Emergency Department (ED) for a headache.     Return to the ED if you have a worsening headache, develop a fever or stiff neck, sudden and severe headache, confusion, slurred speech, facial droop, weakness or numbness in any arm or leg, extreme fatigue, vision problems, or other symptoms that concern you.

## 2022-06-09 NOTE — ED Provider Notes (Signed)
[facility] Provider Note    Event Date/Time   First MD Initiated Contact with Patient 06/09/22 1249     (approximate)   History   Headache   HPI  Karen Hunt is a 31 y.o. female who on review of [facility] discharge summary from this year has a history of recurrent migraines and previous stroke  Patient presents today for "migraine".  Patient reports that since Friday of last week a little over a week now she has had throbbing left-sided headache.  No fevers.  No neck stiffness.  Reports his headache started like a typical migraine throbbing over the left side of her face and is in that typical area but the pain has not diminished and is been present for a week.  Occasionally causing vomiting.  She has been taking her medication as prescribed by [facility] neurology but no improvement  No chest pain or trouble breathing.  Denies pregnancy.  Reports her neurologist told her that she should get an MRI when symptoms like this occur to make sure she has not had another stroke  No trouble speaking.  No numbness or weakness in the arms or legs or difficulty walking.  No confusion.  No facial droop.     Physical Exam   Triage Vital Signs: ED Triage Vitals [06/09/22 1243]  Enc Vitals Group     BP (!) 144/99     Pulse Rate 79     Resp 18     Temp (!) 97.5 F (36.4 C)     Temp Source Oral     SpO2 96 %     Weight (!) 350 lb (158.8 kg)     Height      Head Circumference      Peak Flow      Pain Score 8     Pain Loc      Pain Edu?      Excl. in GC?     Most recent vital signs: Vitals:   06/09/22 1430 06/09/22 1500  BP: 126/63 121/68  Pulse: 72 (!) 47  Resp: 18 18  Temp:    SpO2: 100% 93%     General: Awake, no distress.  Appears in some pain with photophobia, prefers lights off. CV:  Good peripheral perfusion.  Normal tones Resp:  Normal effort.  Abd:  No distention.  Other:  Moves all extremities well to command.  Normal extraocular  movements.  Positive for mild photophobia.  Normal facial movements and no facial droop.  Speech is clear.  Moves all extremities 5 out of 5 strength.  No ataxia or pronator drift in any extremity.  Normal sensation across arms legs and face bilaterally.  No obvious neurologic deficits   ED Results / Procedures / Treatments   Labs (all labs ordered are listed, but only abnormal results are displayed) Labs Reviewed  BASIC METABOLIC PANEL - Abnormal; Notable for the following components:      Result Value   Calcium 8.7 (*)    All other components within normal limits  CBC  MAGNESIUM  HCG, QUANTITATIVE, PREGNANCY   Labs interpreted as normal BMP except for slight hypocalcemia.  Normal CBC  RADIOLOGY  MRI MRA MRV brain pending at time of signout will be followed up by Dr. Cyril Loosen   PROCEDURES:  Critical Care performed: No  Procedures   MEDICATIONS ORDERED IN ED: Medications  LORazepam (ATIVAN) injection 1 mg (has no administration in time range)  prochlorperazine (COMPAZINE) injection  10 mg (10 mg Intravenous Given 06/09/22 1336)  diphenhydrAMINE (BENADRYL) injection 12.5 mg (12.5 mg Intravenous Given 06/09/22 1337)  dexamethasone (DECADRON) injection 10 mg (10 mg Intravenous Given 06/09/22 1336)     IMPRESSION / MDM / ASSESSMENT AND PLAN / ED COURSE  I reviewed the triage vital signs and the nursing notes.                              Differential diagnosis includes, but is not limited to, intracranial hemorrhage, meningitis/encephalitis, previous head trauma, cavernous venous thrombosis, tension headache, temporal arteritis, migraine or migraine equivalent, idiopathic intracranial hypertension, and non-specific headache.  Suspect likely chronic recurrence of migraine with intractable nature at this time.  However, discussed with Dr. Selina Cooley of neurology who has reviewed [facility] records and recommends imaging including MRI MRA MRV   Patient's presentation is most consistent with  acute illness / injury with system symptoms.  The patient is on the cardiac monitor to evaluate for evidence of arrhythmia and/or significant heart rate changes.  Clinical Course as of 06/09/22 1547  Sun Jun 09, 2022  1305 Discussed case and presentation with Dr. Selina Cooley of neurology.  She is reviewing [facility] notes and will provide recommendations [MQ]  1333 Discussed with Dr. Selina Cooley.  Medications ordered as recommended by Dr. Selina Cooley including Compazine Benadryl and Decadron.  Also MRI imaging as recommended by Dr. Selina Cooley pending at this time [MQ]  1438 Patient resting, reports headache is improved at this time.  Still present, but feels better. [MQ]    Clinical Course User Index [MQ] Sharyn Creamer, MD   ----------------------------------------- 3:45 PM on 06/09/2022 ----------------------------------------- Ongoing care assigned to Dr. Cyril Loosen.  Follow-up on pending MRI imaging and reassessment of patient's symptoms thereafter.  Currently reporting improvement in headache.  If MRI imaging negative, anticipate discharge with recommendation of close follow-up with [facility] neurology.  Patient already aware that she cannot drive herself home after the medication she has been given, and her mother is here with her  FINAL CLINICAL IMPRESSION(S) / ED DIAGNOSES   Final diagnoses:  Left-sided headache     Rx / DC Orders   ED Discharge Orders     None        Note:  This document was prepared using Dragon voice recognition software and may include unintentional dictation errors.   Sharyn Creamer, MD 06/09/22 1547

## 2022-06-09 NOTE — ED Notes (Signed)
PT provides verbal consent for dc at this time and is ambulatory to lobby on foot

## 2022-06-09 NOTE — ED Triage Notes (Signed)
Pt comes pov with headache for about a week. Hx of similar and also states hx of stroke in 2020. Denies any numbness, tingling, weakness, or blurred vision. Headache with nausea. Also has hx of seizures. Is not on a thinner anymore.

## 2022-06-16 ENCOUNTER — Ambulatory Visit
Admission: EM | Admit: 2022-06-16 | Discharge: 2022-06-16 | Disposition: A | Discharge status: Home / Self Care | Payer: Medicaid Other | Attending: Emergency Medicine | Admitting: Emergency Medicine

## 2022-06-16 ENCOUNTER — Encounter: Payer: Self-pay | Admitting: Emergency Medicine

## 2022-06-16 DIAGNOSIS — B9689 Other specified bacterial agents as the cause of diseases classified elsewhere: Secondary | ICD-10-CM

## 2022-06-16 DIAGNOSIS — N3001 Acute cystitis with hematuria: Secondary | ICD-10-CM | POA: Diagnosis present

## 2022-06-16 DIAGNOSIS — N760 Acute vaginitis: Secondary | ICD-10-CM | POA: Insufficient documentation

## 2022-06-16 LAB — WET PREP, GENITAL
Sperm: NONE SEEN
Trich, Wet Prep: NONE SEEN
WBC, Wet Prep HPF POC: <10 — AB (ref <10)
Yeast Wet Prep HPF POC: NONE SEEN

## 2022-06-16 LAB — URINALYSIS, MICROSCOPIC (REFLEX)

## 2022-06-16 LAB — URINALYSIS, ROUTINE W REFLEX MICROSCOPIC
Bilirubin Urine: NEGATIVE
Glucose, UA: NEGATIVE mg/dL
Ketones, ur: NEGATIVE mg/dL
Nitrite: NEGATIVE
Protein, ur: 30 mg/dL — AB
Specific Gravity, Urine: 1.02 (ref 1.005–1.030)
pH: 6 (ref 5.0–8.0)

## 2022-06-16 MED ORDER — PHENAZOPYRIDINE HCL 200 MG PO TABS: 200.0000 mg | ORAL_TABLET | Freq: Three times a day (TID) | ORAL | 0 refills | Status: DC | PRN

## 2022-06-16 MED ORDER — METRONIDAZOLE 500 MG PO TABS: 500.0000 mg | ORAL_TABLET | Freq: Two times a day (BID) | ORAL | 0 refills | Status: AC

## 2022-06-16 MED ORDER — NITROFURANTOIN MONOHYD MACRO 100 MG PO CAPS
100.0000 mg | ORAL_CAPSULE | Freq: Two times a day (BID) | ORAL | 0 refills | Status: AC
Start: 2022-06-16 — End: 2022-06-21

## 2022-06-16 NOTE — Discharge Instructions (Addendum)
Your urine is positive for urinary tract infection.  I have sent it off for culture to make sure we have you on the right antibiotic.  Finish the [employer], even if you feel better.  Push plenty of extra fluids.  May take 1000 mg of Tylenol 3-4 times a day as needed for pain.  You also have bacterial vaginosis.  I am sending you home with Flagyl for this.

## 2022-06-16 NOTE — ED Triage Notes (Signed)
Patient has only one kidney.  Patient states that she woke up during the night with dysuria, hematuria, and urinary frequency.

## 2022-06-16 NOTE — ED Provider Notes (Signed)
HPI  SUBJECTIVE:  Karen Hunt is a 31 y.o. female who presents with dysuria, hematuria starting last night.  She reports urinary urgency.  No urinary frequency, cloudy or odorous urine, nausea, vomiting, fevers, abdominal, back, pelvic pain.  No vaginal odor, bleeding, discharge, rash, itching.  She is in a monogamous relationship with a female, who is asymptomatic.  STDs are not a concern today.  No antibiotics in the past month. No Antipyretic in the past 6 hours.  She tried drinking fluids, heating pad and taking a hot shower without improvement in her symptoms.  No aggravating factors.  She is status post right nephrectomy, has a history of frequent UTIs and pyelonephritis.  History of TIA x3 with resulting seizures, chronic migraines.  She is not on any anticoagulants or antiplatelets.  No history of nephrolithiasis.  LMP: Amenorrheic due to Mirena.  Denies possibility being pregnant.  Had a negative pregnancy test in the ED 1 week ago.  PCP: [facility]    Past Medical History:  Diagnosis Date   Hypertension    Migraines    Seizures (HCC)    TIA (transient ischemic attack)     Past Surgical History:  Procedure Laterality Date   c section     FOOT SURGERY Left    KIDNEY SURGERY      History reviewed. No pertinent family history.  Social History   Tobacco Use   Smoking status: Every Day    Packs/day: 1.00    Types: Cigarettes   Smokeless tobacco: Never  Vaping Use   Vaping Use: Never used  Substance Use Topics   Alcohol use: Yes    Comment: rare   Drug use: Yes    Types: Marijuana    No current facility-administered medications for this encounter.  Current Outpatient Medications:    metroNIDAZOLE (FLAGYL) 500 MG tablet, Take 1 tablet (500 mg total) by mouth 2 (two) times daily for 7 days., Disp: 14 tablet, Rfl: 0   nitrofurantoin, macrocrystal-monohydrate, (MACROBID) 100 MG capsule, Take 1 capsule (100 mg total) by mouth 2 (two) times daily for 5 days.,  Disp: 10 capsule, Rfl: 0   phenazopyridine (PYRIDIUM) 200 MG tablet, Take 1 tablet (200 mg total) by mouth 3 (three) times daily as needed for pain., Disp: 6 tablet, Rfl: 0   albuterol (VENTOLIN HFA) 108 (90 Base) MCG/ACT inhaler, Inhale 2 puffs into the lungs every 4 (four) hours as needed for wheezing or shortness of breath., Disp: 8 g, Rfl: 0   atorvastatin (LIPITOR) 80 MG tablet, Take by mouth., Disp: , Rfl:    butalbital-acetaminophen-caffeine (FIORICET) 50-325-40 MG tablet, Take 1-2 tablets by mouth every 6 (six) hours as needed for headache., Disp: 20 tablet, Rfl: 0   cyanocobalamin 100 MCG tablet, Take 100 mcg by mouth daily., Disp: , Rfl:    famotidine (PEPCID) 20 MG tablet, Take by mouth., Disp: , Rfl:    levonorgestrel (MIRENA) 20 MCG/24HR IUD, 1 each by Intrauterine route once., Disp: , Rfl:    magnesium 30 MG tablet, Take 30 mg by mouth 2 (two) times daily., Disp: , Rfl:    naloxone (NARCAN) nasal spray 4 mg/0.1 mL, SMARTSIG:Both Nares, Disp: , Rfl:    topiramate (TOPAMAX) 25 MG tablet, Take 25 mg by mouth 2 (two) times daily., Disp: , Rfl:    UBRELVY 50 MG TABS, Take by mouth., Disp: , Rfl:   Allergies  Allergen Reactions   Ciprofloxacin Nausea And Vomiting     ROS  As  noted in HPI.   Physical Exam  BP (!) 146/98 (BP Location: Left Arm)   Pulse 63   Temp 98.2 F (36.8 C) (Oral)   Resp 14   Ht 5\' 11"  (1.803 m)   Wt (!) 158.8 kg   SpO2 97%   BMI 48.82 kg/m   Constitutional: Well developed, well nourished, no acute distress Eyes:  EOMI, conjunctiva normal bilaterally HENT: Normocephalic, atraumatic,mucus membranes moist Respiratory: Normal inspiratory effort Cardiovascular: Normal rate GI: nondistended positive suprapubic, mild left flank tenderness. No left CVA tenderness skin: No rash, skin intact Musculoskeletal: no deformities Neurologic: Alert & oriented x 3, no focal neuro deficits Psychiatric: Speech and behavior appropriate   ED  Course   Medications - No data to display  Orders Placed This Encounter  Procedures   Wet prep, genital    Standing Status:   Standing    Number of Occurrences:   1   Urine Culture    Standing Status:   Standing    Number of Occurrences:   1    Order Specific Question:   Indication    Answer:   Dysuria   Urinalysis, Routine w reflex microscopic Urine, Clean Catch    Standing Status:   Standing    Number of Occurrences:   1   Urinalysis, Microscopic (reflex)    Standing Status:   Standing    Number of Occurrences:   1    Results for orders placed or performed during the hospital encounter of 06/16/22 (from the past 24 hour(s))  Urinalysis, Routine w reflex microscopic Urine, Clean Catch     Status: Abnormal   Collection Time: 06/16/22  8:39 AM  Result Value Ref Range   Color, Urine YELLOW YELLOW   APPearance HAZY (A) CLEAR   Specific Gravity, Urine 1.020 1.005 - 1.030   pH 6.0 5.0 - 8.0   Glucose, UA NEGATIVE NEGATIVE mg/dL   Hgb urine dipstick LARGE (A) NEGATIVE   Bilirubin Urine NEGATIVE NEGATIVE   Ketones, ur NEGATIVE NEGATIVE mg/dL   Protein, ur 30 (A) NEGATIVE mg/dL   Nitrite NEGATIVE NEGATIVE   Leukocytes,Ua SMALL (A) NEGATIVE  Wet prep, genital     Status: Abnormal   Collection Time: 06/16/22  8:39 AM   Specimen: Vaginal  Result Value Ref Range   Yeast Wet Prep HPF POC NONE SEEN NONE SEEN   Trich, Wet Prep NONE SEEN NONE SEEN   Clue Cells Wet Prep HPF POC PRESENT (A) NONE SEEN   WBC, Wet Prep HPF POC <10 (A) <10   Sperm NONE SEEN   Urinalysis, Microscopic (reflex)     Status: Abnormal   Collection Time: 06/16/22  8:39 AM  Result Value Ref Range   RBC / HPF 11-20 0 - 5 RBC/hpf   WBC, UA 21-50 0 - 5 WBC/hpf   Bacteria, UA MANY (A) NONE SEEN   Squamous Epithelial / LPF 0-5 0 - 5   WBC Clumps PRESENT    No results found.  ED Clinical Impression  1. Acute cystitis with hematuria   2. BV (bacterial vaginosis)      ED Assessment/Plan  Calculated  creatinine clearance from labs done on 06/09/2022 235 mL/min.  H&P consistent with a urinary tract infection.  Urinalysis shows many bacteria, large hematuria, small leukocytes and pyuria.  Home with Macrobid, Tylenol.  Will send off for culture to confirm antibiotic choice.  She also has BV.  Will send home with Flagyl.  Follow-up with PCP as needed.  ER return precautions given.  Discussed labs,  MDM, treatment plan, and plan for follow-up with patient. Discussed sn/sx that should prompt return to the ED. patient agrees with plan.   Meds ordered this encounter  Medications   nitrofurantoin, macrocrystal-monohydrate, (MACROBID) 100 MG capsule    Sig: Take 1 capsule (100 mg total) by mouth 2 (two) times daily for 5 days.    Dispense:  10 capsule    Refill:  0   phenazopyridine (PYRIDIUM) 200 MG tablet    Sig: Take 1 tablet (200 mg total) by mouth 3 (three) times daily as needed for pain.    Dispense:  6 tablet    Refill:  0   metroNIDAZOLE (FLAGYL) 500 MG tablet    Sig: Take 1 tablet (500 mg total) by mouth 2 (two) times daily for 7 days.    Dispense:  14 tablet    Refill:  0      *This clinic note was created using Scientist, clinical (histocompatibility and immunogenetics). Therefore, there may be occasional mistakes despite careful proofreading.  ?    Domenick Gong, MD 06/16/22 [phone]

## 2022-06-17 LAB — URINE CULTURE: Culture: NO GROWTH

## 2022-07-01 ENCOUNTER — Other Ambulatory Visit: Payer: Self-pay

## 2022-07-01 ENCOUNTER — Ambulatory Visit
Admission: EM | Admit: 2022-07-01 | Discharge: 2022-07-01 | Disposition: A | Discharge status: Home / Self Care | Payer: Medicaid Other | Attending: Physician Assistant | Admitting: Physician Assistant

## 2022-07-01 ENCOUNTER — Encounter: Payer: Self-pay | Admitting: *Deleted

## 2022-07-01 DIAGNOSIS — R509 Fever, unspecified: Secondary | ICD-10-CM | POA: Insufficient documentation

## 2022-07-01 DIAGNOSIS — J039 Acute tonsillitis, unspecified: Secondary | ICD-10-CM | POA: Insufficient documentation

## 2022-07-01 LAB — GROUP A STREP BY PCR: Group A Strep by PCR: NOT DETECTED

## 2022-07-01 MED ORDER — AMOXICILLIN-POT CLAVULANATE 875-125 MG PO TABS: 1.0000 | ORAL_TABLET | Freq: Two times a day (BID) | ORAL | 0 refills | Status: AC

## 2022-07-01 MED ORDER — PREDNISONE 20 MG PO TABS: 40.0000 mg | ORAL_TABLET | Freq: Every day | ORAL | 0 refills | Status: AC

## 2022-07-01 NOTE — Discharge Instructions (Addendum)
-  Strep is negative but I still believe you may have strep swab sent antibiotics to pharmacy and also prednisone for couple days help with the tonsil swelling.  It is possible to still could be a viral tonsillitis and will need to run its course over the next week or so. - Chloraseptic spray to help with pain, rest and fluids.  Tylenol for pain. - You should be feeling better in the next couple of days.

## 2022-07-01 NOTE — ED Provider Notes (Signed)
[facility]    CSN: [id number] Arrival date & time: 07/01/22  1012      History   Chief Complaint Chief Complaint  Patient presents with   Sore Throat   Fever    HPI Karen Hunt is a 31 y.o. female presenting for temps up to 100 degrees, fatigue, headaches, sore throat and congestion for the past couple of days.  Patient reports she recently noticed a lot of white patches on her tonsils and her tonsils are swollen.  Also reports tender and enlarged lymph nodes of her neck.  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  Reports a lot of contact with people.  Denies any known exposure to strep or other illnesses.  Has been taking over-the-counter antipyretics for fever.  Temp currently 100 degrees.  No other complaints.  HPI  Past Medical History:  Diagnosis Date   Hypertension    Migraines    Seizures ([place])    TIA (transient ischemic attack)     Patient Active Problem List   Diagnosis Date Noted   Acute bilateral low back pain 10/03/2021   Gastroesophageal reflux disease 07/03/2021   Increased frequency of urination 07/03/2021   Class 3 severe obesity due to excess calories with body mass index (BMI) of 45.0 to 49.9 in adult [facility]) 12/28/2019   History of ischemic stroke 11/02/2019   Seizure disorder as sequela of cerebrovascular accident ([place]) 11/02/2019   Tobacco use disorder 10/22/2019   Nonintractable episodic headache 10/19/2019   Healthcare maintenance 08/17/2018   Recurrent major depressive disorder, in partial remission ([place]) 08/17/2018   Recurrent acute suppurative otitis media of right ear without spontaneous rupture of tympanic membrane 10/03/2015   Hypertension 09/22/2011    Past Surgical History:  Procedure Laterality Date   c section     FOOT SURGERY Left    KIDNEY SURGERY      OB History     Gravida  2   Para      Term      Preterm      AB  1   Living  1      SAB  1   IAB      Ectopic      Multiple      Live  Births               Home Medications    Prior to Admission medications   Medication Sig Start Date End Date Taking? Authorizing Provider  amoxicillin-clavulanate (AUGMENTIN) 875-125 MG tablet Take 1 tablet by mouth every 12 (twelve) hours for 10 days. 07/01/22 07/11/22 Yes Danton Clap, PA-C  predniSONE (DELTASONE) 20 MG tablet Take 2 tablets (40 mg total) by mouth daily for 3 days. 07/01/22 07/04/22 Yes Danton Clap, PA-C  albuterol (VENTOLIN HFA) 108 (90 Base) MCG/ACT inhaler Inhale 2 puffs into the lungs every 4 (four) hours as needed for wheezing or shortness of breath. 04/18/22   Hans Eden, NP  atorvastatin (LIPITOR) 80 MG tablet Take by mouth. 07/26/21 07/26/22  [provider]  butalbital-acetaminophen-caffeine (FIORICET) 50-325-40 MG tablet Take 1-2 tablets by mouth every 6 (six) hours as needed for headache. 06/09/22 06/09/23  Lavonia Drafts, MD  cyanocobalamin 100 MCG tablet Take 100 mcg by mouth daily.    [provider]  famotidine (PEPCID) 20 MG tablet Take by mouth. 07/03/21 07/03/22  [provider]  levonorgestrel (MIRENA) 20 MCG/24HR IUD 1 each by Intrauterine route once.    [provider]  magnesium 30 MG tablet Take 30 mg by mouth 2 (two) times daily.    [provider]  naloxone [facility]) nasal spray 4 mg/0.1 mL SMARTSIG:Both Nares 03/30/22   [provider]  phenazopyridine (PYRIDIUM) 200 MG tablet Take 1 tablet (200 mg total) by mouth 3 (three) times daily as needed for pain. 06/16/22   Domenick Gong, MD  topiramate (TOPAMAX) 25 MG tablet Take 25 mg by mouth 2 (two) times daily. 03/23/22   [provider]  UBRELVY 50 MG TABS Take by mouth. 01/18/22   [provider]    Family History History reviewed. No pertinent family history.  Social History Social History   Tobacco Use   Smoking status: Every Day    Packs/day: 1.00    Types: Cigarettes   Smokeless tobacco: Never  Vaping Use   Vaping  Use: Never used  Substance Use Topics   Alcohol use: Yes    Comment: rare   Drug use: Yes    Types: Marijuana     Allergies   Ciprofloxacin   Review of Systems Review of Systems  Constitutional:  Positive for fever. Negative for chills, diaphoresis and fatigue.  HENT:  Positive for congestion and sore throat. Negative for ear pain, rhinorrhea, sinus pressure and sinus pain.   Respiratory:  Negative for cough and shortness of breath.   Gastrointestinal:  Negative for abdominal pain, nausea and vomiting.  Musculoskeletal:  Negative for arthralgias and myalgias.  Skin:  Negative for rash.  Neurological:  Positive for headaches. Negative for weakness.  Hematological:  Negative for adenopathy.     Physical Exam Triage Vital Signs ED Triage Vitals  Enc Vitals Group     BP      Pulse      Resp      Temp      Temp src      SpO2      Weight      Height      Head Circumference      Peak Flow      Pain Score      Pain Loc      Pain Edu?      Excl. in GC?    No data found.  Updated Vital Signs BP 136/88   Pulse 100   Temp 100 F (37.8 C)   Resp 18   SpO2 100%     Physical Exam Vitals and nursing note reviewed.  Constitutional:      General: She is not in acute distress.    Appearance: Normal appearance. She is ill-appearing. She is not toxic-appearing.  HENT:     Head: Normocephalic and atraumatic.     Nose: Nose normal.     Mouth/Throat:     Mouth: Mucous membranes are moist.     Pharynx: Oropharynx is clear. Posterior oropharyngeal erythema present.     Tonsils: 2+ on the right. 2+ on the left.     Comments: White exudates on bilateral tonsils Eyes:     General: No scleral icterus.       Right eye: No discharge.        Left eye: No discharge.     Conjunctiva/sclera: Conjunctivae normal.  Cardiovascular:     Rate and Rhythm: Normal rate and regular rhythm.     Heart sounds: Normal heart sounds.  Pulmonary:     Effort: Pulmonary effort is normal. No  respiratory distress.     Breath sounds: Normal breath sounds.  Musculoskeletal:     Cervical back: Neck supple.  Lymphadenopathy:     Cervical: Cervical adenopathy present.  Skin:    General: Skin is dry.  Neurological:     General: No focal deficit present.     Mental Status: She is alert. Mental status is at baseline.     Motor: No weakness.     Gait: Gait normal.  Psychiatric:        Mood and Affect: Mood normal.        Behavior: Behavior normal.        Thought Content: Thought content normal.      UC Treatments / Results  Labs (all labs ordered are listed, but only abnormal results are displayed) Labs Reviewed  GROUP A STREP BY PCR    EKG   Radiology No results found.  Procedures Procedures (including critical care time)  Medications Ordered in UC Medications - No data to display  Initial Impression / Assessment and Plan / UC Course  I have reviewed the triage vital signs and the nursing notes.  Pertinent labs & imaging results that were available during my care of the patient were reviewed by me and considered in my medical decision making (see chart for details).  31 year old female presenting for temps up to 100 degrees, fatigue, sore throat, headaches and congestion for the past couple days.  Patient is ill-appearing but nontoxic.  Erythema posterior pharynx with 2+ bilateral enlarged tonsils and whitish exudates, tender enlarged anterior cervical lymph nodes.  Chest clear to auscultation.  PCR strep negative.  Discussed result with patient.  Believe this to be a false negative given her constellation of symptoms.  Also could be viral, but she meets [facility] criteria for treatment for strep.  Sent Augmentin to pharmacy as well as prednisone to help with tonsil swelling.  Supportive care advised.  Reviewed return and ER precautions and gave her a note for work.    Final Clinical Impressions(s) / UC Diagnoses   Final diagnoses:  Acute tonsillitis,  unspecified etiology  Low grade fever     Discharge Instructions      -Strep is negative but I still believe you may have strep swab sent antibiotics to pharmacy and also prednisone for couple days help with the tonsil swelling.  It is possible to still could be a viral tonsillitis and will need to run its course over the next week or so. - Chloraseptic spray to help with pain, rest and fluids.  Tylenol for pain. - You should be feeling better in the next couple of days.     ED Prescriptions     Medication Sig Dispense Auth. Provider   amoxicillin-clavulanate (AUGMENTIN) 875-125 MG tablet Take 1 tablet by mouth every 12 (twelve) hours for 10 days. 20 tablet Eusebio Friendly B, PA-C   predniSONE (DELTASONE) 20 MG tablet Take 2 tablets (40 mg total) by mouth daily for 3 days. 6 tablet Gareth Morgan      PDMP not reviewed this encounter.   Shirlee Latch, PA-C 07/01/22 1159

## 2022-07-01 NOTE — ED Triage Notes (Signed)
T reports having a sore throat with fever starting today.

## 2022-10-28 ENCOUNTER — Ambulatory Visit
Admission: RE | Admit: 2022-10-28 | Discharge: 2022-10-28 | Disposition: A | Discharge status: Home / Self Care | Payer: Medicaid Other | Source: Ambulatory Visit

## 2022-10-28 VITALS — BP 122/84 | HR 71 | Temp 98.2°F | Resp 16

## 2022-10-28 DIAGNOSIS — H66011 Acute suppurative otitis media with spontaneous rupture of ear drum, right ear: Secondary | ICD-10-CM | POA: Diagnosis not present

## 2022-10-28 MED ORDER — AMOXICILLIN-POT CLAVULANATE 875-125 MG PO TABS: 1.0000 | ORAL_TABLET | Freq: Two times a day (BID) | ORAL | 0 refills | Status: AC

## 2022-10-28 NOTE — ED Provider Notes (Signed)
[facility]    CSN: [id number] Arrival date & time: 10/28/22  1440      History   Chief Complaint Chief Complaint  Patient presents with   Ear Injury    I was using my pinky to scratch/ rub my ear and then there was a lot of pressure and a loud pop. Now everything is muffled and there's a lot of pain. - Entered by patient    HPI Karen Hunt is a 31 y.o. female.   HPI  31 year old female here for evaluation of right ear pain.  Patient reports that her ear was itching in the light and she was her pinky finger to scratch her ear.  When she went to pull her finger out she felt a lot of pressure, her loud pop, and has had pain and muffled hearing in her right ear since.  She denies any blood from the ear but she does endorse decreased hearing, dizziness, and ringing in ears.  She has had a significant number of ear infections and has had ruptured eardrums in the past and she is afraid that she ruptured her eardrum again.  She is using a cotton ball to help prevent the changes from ear pressure from increasing the pain in her ear.  Past Medical History:  Diagnosis Date   Hypertension    Migraines    Seizures (HCC)    TIA (transient ischemic attack)     Patient Active Problem List   Diagnosis Date Noted   Acute bilateral low back pain 10/03/2021   Gastroesophageal reflux disease 07/03/2021   Increased frequency of urination 07/03/2021   Class 3 severe obesity due to excess calories with body mass index (BMI) of 45.0 to 49.9 in adult [facility]) 12/28/2019   History of ischemic stroke 11/02/2019   Seizure disorder as sequela of cerebrovascular accident (HCC) 11/02/2019   Tobacco use disorder 10/22/2019   Nonintractable episodic headache 10/19/2019   Healthcare maintenance 08/17/2018   Recurrent major depressive disorder, in partial remission (HCC) 08/17/2018   Recurrent acute suppurative otitis media of right ear without spontaneous rupture of tympanic membrane  10/03/2015   Hypertension 09/22/2011    Past Surgical History:  Procedure Laterality Date   c section     FOOT SURGERY Left    KIDNEY SURGERY      OB History     Gravida  2   Para      Term      Preterm      AB  1   Living  1      SAB  1   IAB      Ectopic      Multiple      Live Births               Home Medications    Prior to Admission medications   Medication Sig Start Date End Date Taking? Authorizing Provider  amoxicillin-clavulanate (AUGMENTIN) 875-125 MG tablet Take 1 tablet by mouth every 12 (twelve) hours for 10 days. 10/28/22 11/07/22 Yes Becky Augusta, NP  atorvastatin (LIPITOR) 80 MG tablet Take by mouth. 07/26/21 10/28/22 Yes [provider]  cyanocobalamin 100 MCG tablet Take 100 mcg by mouth daily.   Yes [provider]  famotidine (PEPCID) 20 MG tablet Take 1 tablet by mouth 2 (two) times daily. 09/19/22  Yes [provider]  levonorgestrel (MIRENA) 20 MCG/24HR IUD 1 each by Intrauterine route once.   Yes [provider]  magnesium 30  MG tablet Take 30 mg by mouth 2 (two) times daily.   Yes [provider]  naloxone [facility]) nasal spray 4 mg/0.1 mL SMARTSIG:Both Nares 03/30/22  Yes [provider]  AIMOVIG 140 MG/ML SOAJ SMARTSIG:1 Milliliter(s) SUB-Q Every 4 Weeks 10/18/22   [provider]  albuterol (VENTOLIN HFA) 108 (90 Base) MCG/ACT inhaler Inhale 2 puffs into the lungs every 4 (four) hours as needed for wheezing or shortness of breath. 04/18/22   Hans Eden, NP  butalbital-acetaminophen-caffeine (FIORICET) [phone] MG tablet Take 1-2 tablets by mouth every 6 (six) hours as needed for headache. 06/09/22 06/09/23  Lavonia Drafts, MD  phenazopyridine (PYRIDIUM) 200 MG tablet Take 1 tablet (200 mg total) by mouth 3 (three) times daily as needed for pain. 06/16/22   Melynda Ripple, MD  topiramate (TOPAMAX) 25 MG tablet Take 25 mg by mouth 2 (two) times daily. 03/23/22    [provider]  UBRELVY 50 MG TABS Take by mouth. 01/18/22   [provider]    Family History No family history on file.  Social History Social History   Tobacco Use   Smoking status: Every Day    Packs/day: 1.00    Types: Cigarettes   Smokeless tobacco: Never  Vaping Use   Vaping Use: Never used  Substance Use Topics   Alcohol use: Yes    Comment: rare   Drug use: Yes    Types: Marijuana     Allergies   Ciprofloxacin   Review of Systems Review of Systems  HENT:  Positive for ear pain, hearing loss and tinnitus. Negative for ear discharge.   Neurological:  Positive for dizziness.     Physical Exam Triage Vital Signs ED Triage Vitals [10/28/22 1458]  Enc Vitals Group     BP 122/84     Pulse Rate 71     Resp 16     Temp 98.2 F (36.8 C)     Temp Source Oral     SpO2 99 %     Weight      Height      Head Circumference      Peak Flow      Pain Score 7     Pain Loc      Pain Edu?      Excl. in [place]?    No data found.  Updated Vital Signs BP 122/84 (BP Location: Left Arm)   Pulse 71   Temp 98.2 F (36.8 C) (Oral)   Resp 16   SpO2 99%   Visual Acuity Right Eye Distance:   Left Eye Distance:   Bilateral Distance:    Right Eye Near:   Left Eye Near:    Bilateral Near:     Physical Exam Vitals reviewed.  Constitutional:      Appearance: Normal appearance. She is not ill-appearing.  HENT:     Head: Normocephalic and atraumatic.     Right Ear: Ear canal and external ear normal. There is no impacted cerumen.     Left Ear: Tympanic membrane, ear canal and external ear normal. There is no impacted cerumen.     Ears:     Comments: Right tympanic membrane is ruptured and the surrounding tissue is erythematous.  There is no pus or debris in the external auditory canal.  Left TM is pearly gray in appearance with normal light reflex and clear external auditory canal. Skin:    General: Skin is warm and dry.     Capillary Refill:  Capillary refill takes less than 2 seconds.  Neurological:     General: No focal deficit present.     Mental Status: She is alert and oriented to person, place, and time.  Psychiatric:        Mood and Affect: Mood normal.        Behavior: Behavior normal.        Thought Content: Thought content normal.        Judgment: Judgment normal.      UC Treatments / Results  Labs (all labs ordered are listed, but only abnormal results are displayed) Labs Reviewed - No data to display  EKG   Radiology No results found.  Procedures Procedures (including critical care time)  Medications Ordered in UC Medications - No data to display  Initial Impression / Assessment and Plan / UC Course  I have reviewed the triage vital signs and the nursing notes.  Pertinent labs & imaging results that were available during my care of the patient were reviewed by me and considered in my medical decision making (see chart for details).   Patient is a pleasant, nontoxic-appearing 31 year old female here for evaluation of right ear pain as outlined HPI above.  On exam she does have ruptured TM on the right with a large defect.  There is no pus or debris in the external auditory canal but the surrounding tissue is erythematous and injected.  She is is followed by [facility] and I have encouraged her to make an appointment to have them evaluate her and monitor the healing of her eardrum.  I will start her on Augmentin twice daily for 10 days for treatment of otitis media.  I have also encouraged her to wear a silicone ear plug whenever she is washing her hair or swimming to prevent water from getting in her ear and causing further issues.   Final Clinical Impressions(s) / UC Diagnoses   Final diagnoses:  Non-recurrent acute suppurative otitis media of right ear with spontaneous rupture of tympanic membrane     Discharge Instructions      Take the Augmentin twice daily for 10 days with food for  treatment of your ear infection.  Take an over-the-counter probiotic 1 hour after each dose of antibiotic to prevent diarrhea.  Use over-the-counter Tylenol and ibuprofen as needed for pain or fever.  Place a hot water bottle, or heating pad, underneath your pillowcase at night to help dilate up your ear and aid in pain relief as well as resolution of the infection.  Wear a silicone ear plug when bathing or swimming to keep water from entering your ear.  Make a follow-up appointment with [facility] so that they can monitor the healing process and intervene if necessary.  Return for reevaluation for any new or worsening symptoms.      ED Prescriptions     Medication Sig Dispense Auth. Provider   amoxicillin-clavulanate (AUGMENTIN) 875-125 MG tablet Take 1 tablet by mouth every 12 (twelve) hours for 10 days. 20 tablet Becky Augusta, NP      PDMP not reviewed this encounter.   Becky Augusta, NP 10/28/22 1525

## 2022-10-28 NOTE — Discharge Instructions (Signed)
Take the Augmentin twice daily for 10 days with food for treatment of your ear infection.  Take an over-the-counter probiotic 1 hour after each dose of antibiotic to prevent diarrhea.  Use over-the-counter Tylenol and ibuprofen as needed for pain or fever.  Place a hot water bottle, or heating pad, underneath your pillowcase at night to help dilate up your ear and aid in pain relief as well as resolution of the infection.  Wear a silicone ear plug when bathing or swimming to keep water from entering your ear.  Make a follow-up appointment with [facility] so that they can monitor the healing process and intervene if necessary.  Return for reevaluation for any new or worsening symptoms.

## 2022-10-28 NOTE — ED Triage Notes (Signed)
I was using my pinky to scratch/ rub my ear and then there was a lot of pressure and a loud pop. Now everything is muffled and there's a lot of pain. - Entered by patient

## 2022-12-19 ENCOUNTER — Ambulatory Visit (INDEPENDENT_AMBULATORY_CARE_PROVIDER_SITE_OTHER): Payer: Medicaid Other

## 2022-12-19 ENCOUNTER — Ambulatory Visit
Admission: EM | Admit: 2022-12-19 | Discharge: 2022-12-19 | Disposition: A | Discharge status: Home / Self Care | Payer: Medicaid Other | Attending: Family Medicine | Admitting: Family Medicine

## 2022-12-19 ENCOUNTER — Encounter: Payer: Self-pay | Admitting: Emergency Medicine

## 2022-12-19 DIAGNOSIS — J219 Acute bronchiolitis, unspecified: Secondary | ICD-10-CM

## 2022-12-19 DIAGNOSIS — Z1152 Encounter for screening for COVID-19: Secondary | ICD-10-CM | POA: Insufficient documentation

## 2022-12-19 LAB — RAPID INFLUENZA A&B ANTIGENS
Influenza A (ARMC): NEGATIVE
Influenza B (ARMC): NEGATIVE

## 2022-12-19 LAB — SARS CORONAVIRUS 2 BY RT PCR: SARS Coronavirus 2 by RT PCR: NEGATIVE

## 2022-12-19 MED ORDER — ALBUTEROL SULFATE (2.5 MG/3ML) 0.083% IN NEBU
2.5000 mg | INHALATION_SOLUTION | Freq: Once | RESPIRATORY_TRACT | Status: AC
  Administered 2022-12-19: 2.5 mg via RESPIRATORY_TRACT

## 2022-12-19 MED ORDER — ALBUTEROL SULFATE HFA 108 (90 BASE) MCG/ACT IN AERS: 2.0000 | INHALATION_SPRAY | RESPIRATORY_TRACT | 0 refills | Status: DC | PRN

## 2022-12-19 MED ORDER — DEXAMETHASONE SODIUM PHOSPHATE 10 MG/ML IJ SOLN
10.0000 mg | Freq: Once | INTRAMUSCULAR | Status: AC
  Administered 2022-12-19: 10 mg via INTRAMUSCULAR

## 2022-12-19 MED ORDER — PREDNISONE 10 MG (21) PO TBPK: ORAL_TABLET | Freq: Every day | ORAL | 0 refills | Status: DC

## 2022-12-19 MED ORDER — PROMETHAZINE-DM 6.25-15 MG/5ML PO SYRP: 5.0000 mL | ORAL_SOLUTION | Freq: Four times a day (QID) | ORAL | 0 refills | Status: DC | PRN

## 2022-12-19 NOTE — ED Notes (Signed)
Checked pt o2 at registration. 95% HR 80

## 2022-12-19 NOTE — ED Provider Notes (Signed)
[facility]    CSN: [id number] Arrival date & time: 12/19/22  1343      History   Chief Complaint Chief Complaint  Patient presents with   Cough    Bad cough, congestion and sore throat. Home covid test was negative. - Entered by patient   Shortness of Breath   Wheezing    HPI Karen Hunt is a 31 y.o. female.   HPI   Samanth presents for cough for past 2 months that got worse about 2 days ago. She has productive cough with green sputum. This morning, she began being short of breath.  Has not used her inhaler as she couldn't find it. No history of asthma lung issues. Endorses sore throat, rhinorrhea, nasal congestion, diarrhea, nausea that started yesterday.  She has been taking over-the-counter medications without relief.    Past Medical History:  Diagnosis Date   Hypertension    Migraines    Seizures (HCC)    TIA (transient ischemic attack)     Patient Active Problem List   Diagnosis Date Noted   Acute bilateral low back pain 10/03/2021   Gastroesophageal reflux disease 07/03/2021   Increased frequency of urination 07/03/2021   Class 3 severe obesity due to excess calories with body mass index (BMI) of 45.0 to 49.9 in adult [facility]) 12/28/2019   History of ischemic stroke 11/02/2019   Seizure disorder as sequela of cerebrovascular accident (HCC) 11/02/2019   Tobacco use disorder 10/22/2019   Nonintractable episodic headache 10/19/2019   Healthcare maintenance 08/17/2018   Recurrent major depressive disorder, in partial remission (HCC) 08/17/2018   Recurrent acute suppurative otitis media of right ear without spontaneous rupture of tympanic membrane 10/03/2015   Hypertension 09/22/2011    Past Surgical History:  Procedure Laterality Date   c section     FOOT SURGERY Left    KIDNEY SURGERY      OB History     Gravida  2   Para      Term      Preterm      AB  1   Living  1      SAB  1   IAB      Ectopic      Multiple       Live Births               Home Medications    Prior to Admission medications   Medication Sig Start Date End Date Taking? Authorizing Provider  predniSONE (STERAPRED UNI-PAK 21 TAB) 10 MG (21) TBPK tablet Take by mouth daily. Take 6 tabs by mouth daily  for 2 days, then 5 tabs for 2 days, then 4 tabs for 2 days, then 3 tabs for 2 days, 2 tabs for 2 days, then 1 tab by mouth daily for 2 days 12/19/22  Yes Lamira Borin, DO  promethazine-dextromethorphan (PROMETHAZINE-DM) 6.25-15 MG/5ML syrup Take 5 mLs by mouth 4 (four) times daily as needed. 12/19/22  Yes Kaniah Rizzolo, DO  AIMOVIG 140 MG/ML SOAJ SMARTSIG:1 Milliliter(s) SUB-Q Every 4 Weeks 10/18/22   [provider]  albuterol (VENTOLIN HFA) 108 (90 Base) MCG/ACT inhaler Inhale 2 puffs into the lungs every 4 (four) hours as needed for wheezing or shortness of breath. 12/19/22   Katha Cabal, DO  atorvastatin (LIPITOR) 80 MG tablet Take by mouth. 07/26/21 10/28/22  [provider]  butalbital-acetaminophen-caffeine (FIORICET) 50-325-40 MG tablet Take 1-2 tablets by mouth every 6 (six) hours as needed for headache. 06/09/22  06/09/23  Jene Every, MD  cyanocobalamin 100 MCG tablet Take 100 mcg by mouth daily.    [provider]  famotidine (PEPCID) 20 MG tablet Take 1 tablet by mouth 2 (two) times daily. 09/19/22   [provider]  levonorgestrel (MIRENA) 20 MCG/24HR IUD 1 each by Intrauterine route once.    [provider]  magnesium 30 MG tablet Take 30 mg by mouth 2 (two) times daily.    [provider]  naloxone [facility]) nasal spray 4 mg/0.1 mL SMARTSIG:Both Nares 03/30/22   [provider]  phenazopyridine (PYRIDIUM) 200 MG tablet Take 1 tablet (200 mg total) by mouth 3 (three) times daily as needed for pain. 06/16/22   Domenick Gong, MD  topiramate (TOPAMAX) 25 MG tablet Take 25 mg by mouth 2 (two) times daily. 03/23/22   [provider]  UBRELVY 50 MG TABS  Take by mouth. 01/18/22   [provider]    Family History No family history on file.  Social History Social History   Tobacco Use   Smoking status: Every Day    Packs/day: 1.00    Types: Cigarettes   Smokeless tobacco: Never  Vaping Use   Vaping Use: Never used  Substance Use Topics   Alcohol use: Yes    Comment: rare   Drug use: Yes    Types: Marijuana     Allergies   Ciprofloxacin   Review of Systems Review of Systems: negative unless otherwise stated in HPI.      Physical Exam Triage Vital Signs ED Triage Vitals  Enc Vitals Group     BP 12/19/22 1531 (!) 136/99     Pulse Rate 12/19/22 1531 79     Resp 12/19/22 1531 19     Temp 12/19/22 1531 98 F (36.7 C)     Temp Source 12/19/22 1531 Oral     SpO2 12/19/22 1531 95 %     Weight --      Height --      Head Circumference --      Peak Flow --      Pain Score 12/19/22 1529 4     Pain Loc --      Pain Edu? --      Excl. in [place]? --    No data found.  Updated Vital Signs BP (!) 136/99 (BP Location: Right Arm)   Pulse 79   Temp 98 F (36.7 C) (Oral)   Resp 19   SpO2 97%   Visual Acuity Right Eye Distance:   Left Eye Distance:   Bilateral Distance:    Right Eye Near:   Left Eye Near:    Bilateral Near:     Physical Exam GEN:     alert, non-toxic appearing female   HENT:  mucus membranes moist, clear nasal discharge EYES:   pupils equal and reactive, no scleral injection or discharge NECK:  normal ROM, no meningismus   RESP:  no increased work of breathing, tight, diffuse expiratory wheezing, rales, rhonchi, pausing in between sentences to take of breath CVS:   regular rate and rhythm, brisk cap refill  Skin:   warm and dry    UC Treatments / Results  Labs (all labs ordered are listed, but only abnormal results are displayed) Labs Reviewed  RAPID INFLUENZA A&B ANTIGENS  SARS CORONAVIRUS 2 BY RT PCR    EKG   Radiology DG Chest 2 View  Result Date: 12/19/2022 CLINICAL  DATA:  Provided history: Shortness of  breath. Cough, wheezing, congestion. EXAM: CHEST - 2 VIEW COMPARISON:  Prior chest radiographs 08/29/2015 and earlier. FINDINGS: Heart size within normal limits. Peribronchial thickening. No appreciable airspace consolidation. No evidence of pleural effusion or pneumothorax. No acute bony abnormality identified. IMPRESSION: Peribronchial thickening, a finding which may be seen in the setting of viral infection or reactive airway disease/asthma. No appreciable airspace consolidation. Electronically Signed   By: Jackey Loge D.O.   On: 12/19/2022 16:36    Procedures Procedures (including critical care time)  Medications Ordered in UC Medications  albuterol (PROVENTIL) (2.5 MG/3ML) 0.083% nebulizer solution 2.5 mg (2.5 mg Nebulization Given 12/19/22 1610)  dexamethasone (DECADRON) injection 10 mg (10 mg Intramuscular Given 12/19/22 1550)  albuterol (PROVENTIL) (2.5 MG/3ML) 0.083% nebulizer solution 2.5 mg (2.5 mg Nebulization Given 12/19/22 1639)    Initial Impression / Assessment and Plan / UC Course  I have reviewed the triage vital signs and the nursing notes.  Pertinent labs & imaging results that were available during my care of the patient were reviewed by me and considered in my medical decision making (see chart for details).       Pt is a 31 y.o. female who presents for acute dyspnea with ongoing cough and new onset nasal congestion. Karen Hunt is afebrile here with recent antipyretic. Satting 93-95% on room air. Overall pt showing signs of respiratory distress. Pulmonary exam is remarkable for diffuse wheezing, and rhonchi.  COVID and influenza testing obtained. CXR ordered. Given albuterol nebulizer.   COVID and influenza is negative.  Chest x-ray showing peribronchial thickening which radiologist thinks is secondary to a most likely viral infection or reactive airway disease/asthma.  She has no history of asthma.  She was given 2 albuterol  nebulizers with mild relief.  Oxygen saturation improved to 98%.  I suspect that she has RSV.  Promethazine DM, albuterol and steroid taper sent to her pharmacy.  Strict ED precautions given and she voiced understanding.  Explained lack of efficacy of antibiotics in viral disease.  Typical duration of symptoms discussed.  Work note provided.  Discussed MDM, treatment plan and plan for follow-up with patient who agrees with plan.     Final Clinical Impressions(s) / UC Diagnoses   Final diagnoses:  Acute bronchiolitis due to unspecified organism     Discharge Instructions      Your COVID and influenza tests are negative. Suspect you have RSV.  Checks x-ray did not show a focal pneumonia.  I sent some steroids, cough syrup and an inhaler to your pharmacy.  Use your inhaler every 4 hours (while awake) and at bedtime for the next 36 to 48 hours. You can take Tylenol and/or Ibuprofen as needed for fever reduction and pain relief.    For cough: honey 1/2 to 1 teaspoon (you can dilute the honey in water or another fluid).  You can also use guaifenesin and dextromethorphan for cough. You can use a humidifier for chest congestion and cough.  If you don't have a humidifier, you can sit in the bathroom with the hot shower running.      For sore throat: try warm salt water gargles, Mucinex sore throat cough drops or cepacol lozenges, throat spray, warm tea or water with lemon/honey, popsicles or ice, or OTC cold relief medicine for throat discomfort. You can also purchase chloraseptic spray at the pharmacy or dollar store.   For congestion: take a daily anti-histamine like Zyrtec, Claritin, and a oral decongestant, such as pseudoephedrine.  You can also use  Flonase 1-2 sprays in each nostril daily. Afrin is also a good option, if you do not have high blood pressure.    It is important to stay hydrated: drink plenty of fluids (water, gatorade/powerade/pedialyte, juices, or teas) to keep your throat  moisturized and help further relieve irritation/discomfort.    Return or go to the Emergency Department if symptoms worsen or do not improve in the next few days      ED Prescriptions     Medication Sig Dispense Auth. Provider   albuterol (VENTOLIN HFA) 108 (90 Base) MCG/ACT inhaler Inhale 2 puffs into the lungs every 4 (four) hours as needed for wheezing or shortness of breath. 8 g Donielle Radziewicz, DO   promethazine-dextromethorphan (PROMETHAZINE-DM) 6.25-15 MG/5ML syrup Take 5 mLs by mouth 4 (four) times daily as needed. 118 mL Daimian Sudberry, DO   predniSONE (STERAPRED UNI-PAK 21 TAB) 10 MG (21) TBPK tablet Take by mouth daily. Take 6 tabs by mouth daily  for 2 days, then 5 tabs for 2 days, then 4 tabs for 2 days, then 3 tabs for 2 days, 2 tabs for 2 days, then 1 tab by mouth daily for 2 days 21 tablet Zadaya Cuadra, DO      PDMP not reviewed this encounter.   Katha CabalBrimage, Fabianna Keats, DO 12/19/22 1720

## 2022-12-19 NOTE — ED Triage Notes (Signed)
Pt presents with cough, wheezing, congestion and SOB x 2 days.

## 2022-12-19 NOTE — Discharge Instructions (Addendum)
Your COVID and influenza tests are negative. Suspect you have RSV.  Checks x-ray did not show a focal pneumonia.  I sent some steroids, cough syrup and an inhaler to your pharmacy.  Use your inhaler every 4 hours (while awake) and at bedtime for the next 36 to 48 hours. You can take Tylenol and/or Ibuprofen as needed for fever reduction and pain relief.    For cough: honey 1/2 to 1 teaspoon (you can dilute the honey in water or another fluid).  You can also use guaifenesin and dextromethorphan for cough. You can use a humidifier for chest congestion and cough.  If you don't have a humidifier, you can sit in the bathroom with the hot shower running.      For sore throat: try warm salt water gargles, Mucinex sore throat cough drops or cepacol lozenges, throat spray, warm tea or water with lemon/honey, popsicles or ice, or OTC cold relief medicine for throat discomfort. You can also purchase chloraseptic spray at the pharmacy or dollar store.   For congestion: take a daily anti-histamine like Zyrtec, Claritin, and a oral decongestant, such as pseudoephedrine.  You can also use Flonase 1-2 sprays in each nostril daily. Afrin is also a good option, if you do not have high blood pressure.    It is important to stay hydrated: drink plenty of fluids (water, gatorade/powerade/pedialyte, juices, or teas) to keep your throat moisturized and help further relieve irritation/discomfort.    Return or go to the Emergency Department if symptoms worsen or do not improve in the next few days

## 2023-02-16 ENCOUNTER — Ambulatory Visit
Admission: RE | Admit: 2023-02-16 | Discharge: 2023-02-16 | Disposition: A | Discharge status: Home / Self Care | Payer: Medicaid Other | Source: Ambulatory Visit | Attending: Family Medicine | Admitting: Family Medicine

## 2023-02-16 ENCOUNTER — Telehealth: Payer: Self-pay | Admitting: Family Medicine

## 2023-02-16 VITALS — BP 149/90 | HR 64 | Temp 98.2°F | Ht 71.0 in | Wt 325.0 lb

## 2023-02-16 DIAGNOSIS — M5442 Lumbago with sciatica, left side: Secondary | ICD-10-CM

## 2023-02-16 MED ORDER — TIZANIDINE HCL 4 MG PO TABS: 4.0000 mg | ORAL_TABLET | Freq: Three times a day (TID) | ORAL | 0 refills | Status: DC | PRN

## 2023-02-16 MED ORDER — PREDNISONE 10 MG PO TABS: ORAL_TABLET | ORAL | 0 refills | Status: DC

## 2023-02-16 NOTE — Telephone Encounter (Signed)
Error
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
## 2023-02-16 NOTE — Discharge Instructions (Addendum)
Rest. Heat.  Medications as prescribed.  Take care   Dr. Lacinda Axon

## 2023-03-10 ENCOUNTER — Ambulatory Visit
Admission: RE | Admit: 2023-03-10 | Discharge: 2023-03-10 | Disposition: A | Discharge status: Home / Self Care | Payer: Medicaid Other | Source: Ambulatory Visit | Attending: Physician Assistant | Admitting: Physician Assistant

## 2023-03-10 VITALS — BP 150/84 | HR 68 | Temp 98.1°F | Resp 16 | Ht 71.0 in | Wt 325.0 lb

## 2023-03-10 DIAGNOSIS — H60393 Other infective otitis externa, bilateral: Secondary | ICD-10-CM

## 2023-03-10 DIAGNOSIS — N3000 Acute cystitis without hematuria: Secondary | ICD-10-CM

## 2023-03-10 LAB — URINALYSIS, W/ REFLEX TO CULTURE (INFECTION SUSPECTED)
Bilirubin Urine: NEGATIVE
Glucose, UA: NEGATIVE mg/dL
Ketones, ur: NEGATIVE mg/dL
Nitrite: NEGATIVE
Protein, ur: NEGATIVE mg/dL
Specific Gravity, Urine: 1.02 (ref 1.005–1.030)
pH: 6 (ref 5.0–8.0)

## 2023-03-10 MED ORDER — NITROFURANTOIN MONOHYD MACRO 100 MG PO CAPS: 100.0000 mg | ORAL_CAPSULE | Freq: Two times a day (BID) | ORAL | 0 refills | Status: DC

## 2023-03-10 MED ORDER — CIPROFLOXACIN-DEXAMETHASONE 0.3-0.1 % OT SUSP: 4.0000 [drp] | Freq: Two times a day (BID) | OTIC | 0 refills | Status: DC

## 2023-03-10 NOTE — ED Triage Notes (Signed)
Pt c/o bilateral ear pain. Started about a week ago. She states she gets ear infections a lot.   She is alos c/o urinary frequency and dysuria.  Started about 4 days ago. She has been trying cranberry juice.

## 2023-03-10 NOTE — ED Provider Notes (Signed)
[facility]    CSN: [id number] Arrival date & time: 03/10/23  1140      History   Chief Complaint Chief Complaint  Patient presents with   Urinary Frequency   Otalgia    bilateral    HPI Karen Hunt is a 32 y.o. female.   Patient presents for evaluation of bilateral ear pain and drainage present for 7 days.  Experiencing nasal congestion and rhinorrhea as well.  Has attempted use of warm compresses which has been ineffective.  Endorses history of recurrent ear infections with rupture to the right side.  Denies fevers, sore throat and cough.  Established with ear nose and throat has upcoming appointment on the 22nd.      Past Medical History:  Diagnosis Date   Hypertension    Increased frequency of urination 07/03/2021   Last Assessment & Plan:   Formatting of this note might be different from the original.  For about 2 months w/ on/off pain associated. Check U/A, A1c, and BMP.   Migraines    Seizures ([place])    TIA (transient ischemic attack)     Patient Active Problem List   Diagnosis Date Noted   Acute bilateral low back pain 10/03/2021   Gastroesophageal reflux disease 07/03/2021   Increased frequency of urination 07/03/2021   Class 3 severe obesity due to excess calories with body mass index (BMI) of 45.0 to 49.9 in adult [facility]) 12/28/2019   History of ischemic stroke 11/02/2019   Seizure disorder as sequela of cerebrovascular accident ([place]) 11/02/2019   Tobacco use disorder 10/22/2019   Nonintractable episodic headache 10/19/2019   Healthcare maintenance 08/17/2018   Recurrent major depressive disorder, in partial remission ([place]) 08/17/2018   Recurrent acute suppurative otitis media of right ear without spontaneous rupture of tympanic membrane 10/03/2015   Hypertension 09/22/2011    Past Surgical History:  Procedure Laterality Date   c section     FOOT SURGERY Left    KIDNEY SURGERY      OB History     Gravida  2   Para      Term       Preterm      AB  1   Living  1      SAB  1   IAB      Ectopic      Multiple      Live Births               Home Medications    Prior to Admission medications   Medication Sig Start Date End Date Taking? Authorizing Provider  AIMOVIG 140 MG/ML SOAJ SMARTSIG:1 Milliliter(s) SUB-Q Every 4 Weeks 10/18/22  Yes [provider]  atorvastatin (LIPITOR) 80 MG tablet Take by mouth. 07/26/21 03/10/23 Yes [provider]  cyanocobalamin 100 MCG tablet Take 100 mcg by mouth daily.   Yes [provider]  famotidine (PEPCID) 20 MG tablet Take 1 tablet by mouth 2 (two) times daily. 09/19/22  Yes [provider]  escitalopram (LEXAPRO) 10 MG tablet Take by mouth.    [provider]  levonorgestrel (MIRENA) 20 MCG/24HR IUD 1 each by Intrauterine route once.    [provider]  magnesium 30 MG tablet Take 30 mg by mouth 2 (two) times daily.    [provider]  naloxone [facility]) nasal spray 4 mg/0.1 mL SMARTSIG:Both Nares 03/30/22   [provider]  predniSONE (DELTASONE) 10 MG tablet 50 mg daily x 2 days, then  40 mg daily x 2 days, then 30 mg daily x 2 days, then 20 mg daily x 2 days, then 10 mg daily x 2 days. 02/16/23   Coral Spikes, DO  tiZANidine (ZANAFLEX) 4 MG tablet Take 1 tablet (4 mg total) by mouth every 8 (eight) hours as needed for muscle spasms. 02/16/23   Coral Spikes, DO  topiramate (TOPAMAX) 25 MG tablet Take 25 mg by mouth 2 (two) times daily. 03/23/22   [provider]  traZODone (DESYREL) 50 MG tablet Take 50 mg by mouth at bedtime.    [provider]    Family History No family history on file.  Social History Social History   Tobacco Use   Smoking status: Former    Packs/day: 1.00    Types: Cigarettes   Smokeless tobacco: Never  Vaping Use   Vaping Use: Every day  Substance Use Topics   Alcohol use: Yes    Comment: rare   Drug use: Yes    Types: Marijuana      Allergies   Ciprofloxacin   Review of Systems Review of Systems  Constitutional: Negative.   HENT:  Positive for ear pain. Negative for congestion, dental problem, drooling, ear discharge, facial swelling, hearing loss, mouth sores, nosebleeds, postnasal drip, rhinorrhea, sinus pressure, sinus pain, sneezing, sore throat, tinnitus, trouble swallowing and voice change.   Respiratory: Negative.    Genitourinary:  Positive for dysuria, frequency and pelvic pain. Negative for decreased urine volume, difficulty urinating, dyspareunia, enuresis, flank pain, genital sores, hematuria, menstrual problem, urgency, vaginal bleeding, vaginal discharge and vaginal pain.  Skin: Negative.   Neurological: Negative.      Physical Exam Triage Vital Signs ED Triage Vitals  Enc Vitals Group     BP 03/10/23 1211 (!) 150/84     Pulse Rate 03/10/23 1211 68     Resp 03/10/23 1211 16     Temp 03/10/23 1211 98.1 F (36.7 C)     Temp Source 03/10/23 1211 Oral     SpO2 03/10/23 1211 94 %     Weight 03/10/23 1210 (!) 324 lb 15.3 oz (147.4 kg)     Height 03/10/23 1210 '5\' 11"'$  (1.803 m)     Head Circumference --      Peak Flow --      Pain Score 03/10/23 1209 4     Pain Loc --      Pain Edu? --      Excl. in [place]? --    No data found.  Updated Vital Signs BP (!) 150/84 (BP Location: Right Arm)   Pulse 68   Temp 98.1 F (36.7 C) (Oral)   Resp 16   Ht '5\' 11"'$  (1.803 m)   Wt (!) 324 lb 15.3 oz (147.4 kg)   SpO2 94%   BMI 45.32 kg/m   Visual Acuity Right Eye Distance:   Left Eye Distance:   Bilateral Distance:    Right Eye Near:   Left Eye Near:    Bilateral Near:     Physical Exam Constitutional:      Appearance: Normal appearance.  HENT:     Head: Normocephalic.     Ears:     Comments: Significant erythema present to the bilateral ear canals without drainage, tender on examination, no abnormalities to the tympanic membrane    Nose: Congestion and rhinorrhea present.      Mouth/Throat:     Mouth: Mucous membranes are moist.     Pharynx: Oropharynx  is clear. No posterior oropharyngeal erythema.  Eyes:     Extraocular Movements: Extraocular movements intact.  Pulmonary:     Effort: Pulmonary effort is normal.     Breath sounds: Normal breath sounds.  Abdominal:     General: Abdomen is flat. Bowel sounds are normal.     Palpations: Abdomen is soft.     Tenderness: There is abdominal tenderness in the suprapubic area.  Neurological:     Mental Status: She is alert and oriented to person, place, and time. Mental status is at baseline.      UC Treatments / Results  Labs (all labs ordered are listed, but only abnormal results are displayed) Labs Reviewed  URINALYSIS, W/ REFLEX TO CULTURE (INFECTION SUSPECTED) - Abnormal; Notable for the following components:      Result Value   Hgb urine dipstick TRACE (*)    Leukocytes,Ua SMALL (*)    Bacteria, UA MANY (*)    All other components within normal limits  URINE CULTURE    EKG   Radiology No results found.  Procedures Procedures (including critical care time)  Medications Ordered in UC Medications - No data to display  Initial Impression / Assessment and Plan / UC Course  I have reviewed the triage vital signs and the nursing notes.  Pertinent labs & imaging results that were available during my care of the patient were reviewed by me and considered in my medical decision making (see chart for details).  Infectious otitis media of both ears, acute cystitis without hematuria  Erythema is present to the canals, discussed findings with patient, Ciprodex prescribed and discussed administration, advised over-the-counter analgesics and continue use of warm compresses to the external ear, advised against ear cleaning until symptoms have resolved and medication is complete, may keep upcoming appointment with ENT for reevaluation  Urinalysis showing leukocytes and bacteria, negative for nitrates, sent  for culture, treating for infection, prescribed Macrobid, recommended continued use of increase fluid intake and cranberry juice for supportive measures, also may reattempt use of Azo as needed, may follow-up with his urgent care or primary doctor if symptoms continue to persist or worsen Final Clinical Impressions(s) / UC Diagnoses   Final diagnoses:  None   Discharge Instructions   None    ED Prescriptions   None    PDMP not reviewed this encounter.   Hans Eden, NP 03/10/23 1251

## 2023-03-10 NOTE — Discharge Instructions (Signed)
Today you are being treated for an infection of the ear canal, exam both ear canals are very red, there are no abnormalities to the ears  Place 4 drops of Ciprodex into each ear every morning and every evening for 7 days  You may use Tylenol or ibuprofen for management of discomfort  May hold warm compresses to the ear for additional comfort  Please not attempted any ear cleaning or object or fluid placement into the ear canal to prevent further irritation   Your urinalysis shows Jolaine Fryberger blood cells and bacteria which are indicative of infection, your urine will be sent to the lab to determine exactly which bacteria is present, if any changes need to be made to your medications you will be notified  Begin use of Macrobid every morning every evening for 5 days  You may use over-the-counter Azo to help minimize your symptoms until antibiotic removes bacteria, this medication will turn your urine orange  Increase your fluid intake through use of water  As always practice good hygiene, wiping front to back and avoidance of scented vaginal products to prevent further irritation  If symptoms continue to persist after use of medication or recur please follow-up with urgent care or your primary doctor as needed

## 2023-03-12 ENCOUNTER — Telehealth (HOSPITAL_COMMUNITY): Payer: Self-pay | Admitting: Emergency Medicine

## 2023-03-12 LAB — URINE CULTURE: Culture: 50000 — AB

## 2023-03-12 MED ORDER — CEPHALEXIN 500 MG PO CAPS: 500.0000 mg | ORAL_CAPSULE | Freq: Two times a day (BID) | ORAL | 0 refills | Status: AC

## 2023-03-14 ENCOUNTER — Ambulatory Visit
Admission: RE | Admit: 2023-03-14 | Discharge: 2023-03-14 | Disposition: A | Discharge status: Home / Self Care | Payer: Medicaid Other | Source: Ambulatory Visit | Attending: Physician Assistant | Admitting: Physician Assistant

## 2023-03-14 VITALS — BP 147/90 | HR 70 | Temp 97.9°F | Resp 14 | Ht 71.0 in | Wt 325.0 lb

## 2023-03-14 DIAGNOSIS — H60393 Other infective otitis externa, bilateral: Secondary | ICD-10-CM

## 2023-03-14 MED ORDER — NEOMYCIN-POLYMYXIN-HC 3.5-10000-1 OT SUSP: 3.0000 [drp] | Freq: Four times a day (QID) | OTIC | 0 refills | Status: AC

## 2023-03-14 NOTE — Discharge Instructions (Signed)
-  Stop taking Ciprodex and start using Cortisporin. - Call if you experience the same burning sensation with this drop and I will send a oral antibiotic, but as discussed I do not think an oral antibiotic will be as helpful as an eardrop since the infection is in the ear canal. -Tylenol and Motrin as needed for pain.  Warm compresses. - Keep your appointment with your ENT specialist next week.

## 2023-03-14 NOTE — ED Provider Notes (Signed)
[facility]    CSN: [id number] Arrival date & time: 03/14/23  1055      History   Chief Complaint Chief Complaint  Patient presents with   Otalgia    Appointment    HPI Karen Hunt is a 32 y.o. female presenting for bilateral ear pain and fullness.  Patient was seen at this urgent care 4 days ago for the symptoms and was diagnosed with otitis externa and prescribed Ciprodex.  She says anytime she uses the drops she has severe burning pain in her ears.  She reports that she has an allergy to Cipro causing nausea and vomiting and had never used the drops before but thinks she may be allergic to the drops as well.  She says her ear pain has not improved.  She denies any discharge.  Denies fever, fatigue, cough, ingestion, sinus pain.  Reports that she does have an ENT specialist because she has had a lot of ear infections.  Reports that she has an appointment scheduled for 1 week from today.  Of note, currently on Keflex for UTI.  She was seen for the UTI on 3/11 as well and prescribed Macrobid which had to be changed to Keflex due to the culture result.  She started the Keflex yesterday.  No other concerns.  HPI  Past Medical History:  Diagnosis Date   Hypertension    Increased frequency of urination 07/03/2021   Last Assessment & Plan:   Formatting of this note might be different from the original.  For about 2 months w/ on/off pain associated. Check U/A, A1c, and BMP.   Migraines    Seizures (Early)    TIA (transient ischemic attack)     Patient Active Problem List   Diagnosis Date Noted   Acute bilateral low back pain 10/03/2021   Gastroesophageal reflux disease 07/03/2021   Increased frequency of urination 07/03/2021   Class 3 severe obesity due to excess calories with body mass index (BMI) of 45.0 to 49.9 in adult [facility]) 12/28/2019   History of ischemic stroke 11/02/2019   Seizure disorder as sequela of cerebrovascular accident ([place]) 11/02/2019   Tobacco use  disorder 10/22/2019   Nonintractable episodic headache 10/19/2019   Healthcare maintenance 08/17/2018   Recurrent major depressive disorder, in partial remission ([place]) 08/17/2018   Recurrent acute suppurative otitis media of right ear without spontaneous rupture of tympanic membrane 10/03/2015   Hypertension 09/22/2011    Past Surgical History:  Procedure Laterality Date   c section     FOOT SURGERY Left    KIDNEY SURGERY      OB History     Gravida  2   Para      Term      Preterm      AB  1   Living  1      SAB  1   IAB      Ectopic      Multiple      Live Births               Home Medications    Prior to Admission medications   Medication Sig Start Date End Date Taking? Authorizing Provider  neomycin-polymyxin-hydrocortisone (CORTISPORIN) 3.5-10000-1 OTIC suspension Place 3 drops into both ears 4 (four) times daily for 7 days. 03/14/23 03/21/23 Yes Danton Clap, PA-C  AIMOVIG 140 MG/ML SOAJ SMARTSIG:1 Milliliter(s) SUB-Q Every 4 Weeks 10/18/22   [provider]  atorvastatin (LIPITOR) 80 MG tablet Take by  mouth. 07/26/21 03/10/23  [provider]  cephALEXin (KEFLEX) 500 MG capsule Take 1 capsule (500 mg total) by mouth 2 (two) times daily for 5 days. 03/12/23 03/17/23  Chase Picket, MD  cyanocobalamin 100 MCG tablet Take 100 mcg by mouth daily.    [provider]  escitalopram (LEXAPRO) 10 MG tablet Take by mouth.    [provider]  famotidine (PEPCID) 20 MG tablet Take 1 tablet by mouth 2 (two) times daily. 09/19/22   [provider]  levonorgestrel (MIRENA) 20 MCG/24HR IUD 1 each by Intrauterine route once.    [provider]  magnesium 30 MG tablet Take 30 mg by mouth 2 (two) times daily.    [provider]  naloxone [facility]) nasal spray 4 mg/0.1 mL SMARTSIG:Both Nares 03/30/22   [provider]  predniSONE (DELTASONE) 10 MG tablet 50 mg daily x 2 days, then 40 mg daily x 2  days, then 30 mg daily x 2 days, then 20 mg daily x 2 days, then 10 mg daily x 2 days. 02/16/23   Coral Spikes, DO  tiZANidine (ZANAFLEX) 4 MG tablet Take 1 tablet (4 mg total) by mouth every 8 (eight) hours as needed for muscle spasms. 02/16/23   Coral Spikes, DO  topiramate (TOPAMAX) 25 MG tablet Take 25 mg by mouth 2 (two) times daily. 03/23/22   [provider]  traZODone (DESYREL) 50 MG tablet Take 50 mg by mouth at bedtime.    [provider]    Family History History reviewed. No pertinent family history.  Social History Social History   Tobacco Use   Smoking status: Former    Packs/day: 1    Types: Cigarettes   Smokeless tobacco: Never  Vaping Use   Vaping Use: Every day  Substance Use Topics   Alcohol use: Yes    Comment: rare   Drug use: Yes    Types: Marijuana     Allergies   Ciprofloxacin   Review of Systems Review of Systems  Constitutional:  Negative for chills, diaphoresis, fatigue and fever.  HENT:  Positive for ear pain. Negative for congestion, ear discharge, hearing loss, rhinorrhea, sinus pressure, sinus pain and sore throat.   Respiratory:  Negative for cough and shortness of breath.   Gastrointestinal:  Negative for abdominal pain, nausea and vomiting.  Musculoskeletal:  Negative for arthralgias and myalgias.  Skin:  Negative for rash.  Neurological:  Negative for weakness and headaches.  Hematological:  Negative for adenopathy.     Physical Exam Triage Vital Signs ED Triage Vitals  Enc Vitals Group     BP 03/14/23 1113 (!) 147/90     Pulse Rate 03/14/23 1113 70     Resp 03/14/23 1113 14     Temp 03/14/23 1113 97.9 F (36.6 C)     Temp Source 03/14/23 1113 Oral     SpO2 03/14/23 1113 95 %     Weight 03/14/23 1112 (!) 324 lb 15.3 oz (147.4 kg)     Height 03/14/23 1112 5\' 11"  (1.803 m)     Head Circumference --      Peak Flow --      Pain Score 03/14/23 1112 4     Pain Loc --      Pain Edu? --      Excl. in [place]? --     No data found.  Updated Vital Signs BP (!) 147/90 (BP Location: Right Arm)   Pulse 70  Temp 97.9 F (36.6 C) (Oral)   Resp 14   Ht 5\' 11"  (1.803 m)   Wt (!) 324 lb 15.3 oz (147.4 kg)   SpO2 95%   BMI 45.32 kg/m     Physical Exam Vitals and nursing note reviewed.  Constitutional:      General: She is not in acute distress.    Appearance: Normal appearance. She is not ill-appearing or toxic-appearing.  HENT:     Head: Normocephalic and atraumatic.     Right Ear: External ear normal. Drainage (whitish yellow exudates of EAC) and swelling (moderate erythem and swelling of EAC) present. Tympanic membrane is scarred.     Left Ear: External ear normal. Swelling present. A middle ear effusion is present. Tympanic membrane is scarred.     Nose: Nose normal.     Mouth/Throat:     Mouth: Mucous membranes are moist.     Pharynx: Oropharynx is clear.  Eyes:     General: No scleral icterus.       Right eye: No discharge.        Left eye: No discharge.     Conjunctiva/sclera: Conjunctivae normal.  Cardiovascular:     Rate and Rhythm: Normal rate and regular rhythm.     Heart sounds: Normal heart sounds.  Pulmonary:     Effort: Pulmonary effort is normal. No respiratory distress.     Breath sounds: Normal breath sounds.  Musculoskeletal:     Cervical back: Neck supple.  Skin:    General: Skin is dry.  Neurological:     General: No focal deficit present.     Mental Status: She is alert. Mental status is at baseline.     Motor: No weakness.     Gait: Gait normal.  Psychiatric:        Mood and Affect: Mood normal.        Behavior: Behavior normal.        Thought Content: Thought content normal.      UC Treatments / Results  Labs (all labs ordered are listed, but only abnormal results are displayed) Labs Reviewed - No data to display  EKG   Radiology No results found.  Procedures Procedures (including critical care time)  Medications Ordered in UC Medications -  No data to display  Initial Impression / Assessment and Plan / UC Course  I have reviewed the triage vital signs and the nursing notes.  Pertinent labs & imaging results that were available during my care of the patient were reviewed by me and considered in my medical decision making (see chart for details).   32 year old female returning for bilateral ear pain/pressure and fullness.  Seen here 4 days ago for otitis externa and has been using Ciprodex but reports burning pain when she uses the eardrops.  Patient also on Keflex currently for UTI.  On examination today she does have swelling and erythema of bilateral EACs but more so on the right and yellowish-white exudates of the right EAC.  Clear effusion of the left TM with scarring of both TMs.  Continued otitis externa.  Will have patient discontinue the Ciprodex and start Cortisporin.  Encouraged Tylenol Motrin as needed as well as warm compresses.  Keep follow-up with ENT specialist for 1 week from today.  I did advise her to call back if she has the same burning pain with use of this antibiotic eardrop and I will consider an oral antibiotic.   Final Clinical Impressions(s) / UC Diagnoses  Final diagnoses:  Other infective acute otitis externa of both ears     Discharge Instructions      -Stop taking Ciprodex and start using Cortisporin. - Call if you experience the same burning sensation with this drop and I will send a oral antibiotic, but as discussed I do not think an oral antibiotic will be as helpful as an eardrop since the infection is in the ear canal. -Tylenol and Motrin as needed for pain.  Warm compresses. - Keep your appointment with your ENT specialist next week.     ED Prescriptions     Medication Sig Dispense Auth. Provider   neomycin-polymyxin-hydrocortisone (CORTISPORIN) 3.5-10000-1 OTIC suspension Place 3 drops into both ears 4 (four) times daily for 7 days. 10 mL Danton Clap, PA-C      PDMP not  reviewed this encounter.   Danton Clap, PA-C 03/14/23 1151

## 2023-03-14 NOTE — ED Triage Notes (Signed)
Patient diagnosed with Bilateral ear infection. Patient states that after using her ear drops her ears feel like they are burning.  Patient denies fevers.

## 2023-05-09 ENCOUNTER — Inpatient Hospital Stay
Admission: RE | Admit: 2023-05-09 | Discharge: 2023-05-09 | Disposition: A | Discharge status: Home / Self Care | Payer: Medicaid Other | Source: Ambulatory Visit

## 2023-05-09 NOTE — Pre-Procedure Instructions (Signed)
Unable to reach patient for her pre admit phone interview. VM left x2. ENT office notified.

## 2023-05-13 ENCOUNTER — Encounter
Admission: RE | Admit: 2023-05-13 | Discharge: 2023-05-13 | Disposition: A | Discharge status: Home / Self Care | Payer: Medicaid Other | Source: Ambulatory Visit | Attending: Otolaryngology | Admitting: Otolaryngology

## 2023-05-13 ENCOUNTER — Other Ambulatory Visit: Payer: Self-pay

## 2023-05-13 VITALS — Ht 71.0 in | Wt 346.0 lb

## 2023-05-13 DIAGNOSIS — Z01812 Encounter for preprocedural laboratory examination: Secondary | ICD-10-CM

## 2023-05-13 HISTORY — DX: Anxiety disorder, unspecified: F41.9

## 2023-05-13 HISTORY — DX: Chronic kidney disease, unspecified: N18.9

## 2023-05-13 HISTORY — DX: Cerebral infarction, unspecified: I63.9

## 2023-05-13 NOTE — Patient Instructions (Addendum)
Your procedure is scheduled on: Wednesday May 14, 2023. Report to the Registration Desk on the 1st floor of the [facility]. To find out your arrival time, please call [phone] between 1PM - 3PM on: Tuesday May 13, 2023. If your arrival time is 6:00 am, do not arrive before that time as the [facility] entrance doors do not open until 6:00 am.  REMEMBER: Instructions that are not followed completely may result in serious medical risk, up to and including death; or upon the discretion of your surgeon and anesthesiologist your surgery may need to be rescheduled.  Do not eat food after midnight the night before surgery.  No gum chewing or hard candies.  You may however, drink CLEAR liquids up to 2 hours before you are scheduled to arrive for your surgery. Do not drink anything within 2 hours of your scheduled arrival time.  Clear liquids include: - water  - apple juice without pulp - gatorade (not RED colors) - black coffee or tea (Do NOT add milk or creamers to the coffee or tea) Do NOT drink anything that is not on this list.   One week prior to surgery: Stop Anti-inflammatories (NSAIDS) such as Advil, Aleve, Ibuprofen, Motrin, Naproxen, Naprosyn and Aspirin based products such as Excedrin, Goody's Powder, BC Powder. Stop ANY OVER THE COUNTER supplements until after surgery. You may however, continue to take Tylenol if needed for pain up until the day of surgery.  Continue taking all prescribed medications with the exception of the following:   Follow recommendations from Cardiologist or PCP regarding stopping blood thinners.  TAKE ONLY THESE MEDICATIONS THE MORNING OF SURGERY WITH A SIP OF WATER:  atorvastatin (LIPITOR) 80 MG  escitalopram (LEXAPRO) 10 MG  famotidine (PEPCID) 20 MG Antacid (take one the night before and one on the morning of surgery - helps to prevent nausea after surgery.)   No Alcohol for 24 hours before or after surgery.  No Smoking including  e-cigarettes for 24 hours before surgery.  No chewable tobacco products for at least 6 hours before surgery.  No nicotine patches on the day of surgery.  Do not use any "recreational" drugs for at least a week (preferably 2 weeks) before your surgery.  Please be advised that the combination of cocaine and anesthesia may have negative outcomes, up to and including death. If you test positive for cocaine, your surgery will be cancelled.  On the morning of surgery brush your teeth with toothpaste and water, you may rinse your mouth with mouthwash if you wish. Do not swallow any toothpaste or mouthwash.  Use CHG Soap or wipes as directed on instruction sheet.  Do not wear jewelry, make-up, hairpins, clips or nail polish.  Do not wear lotions, powders, or perfumes.   Do not shave body hair from the neck down 48 hours before surgery.  Contact lenses, hearing aids and dentures may not be worn into surgery.  Do not bring valuables to the hospital. [facility] is not responsible for any missing/lost belongings or valuables.   Notify your doctor if there is any change in your medical condition (cold, fever, infection).  Wear comfortable clothing (specific to your surgery type) to the hospital.  After surgery, you can help prevent lung complications by doing breathing exercises.  Take deep breaths and cough every 1-2 hours. Your doctor may order a device called an Incentive Spirometer to help you take deep breaths. When coughing or sneezing, hold a pillow firmly against your incision with  both hands. This is called "splinting." Doing this helps protect your incision. It also decreases belly discomfort.  If you are being admitted to the hospital overnight, leave your suitcase in the car. After surgery it may be brought to your room.  In case of increased patient census, it may be necessary for you, the patient, to continue your postoperative care in the Same Day Surgery department.  If you are  being discharged the day of surgery, you will not be allowed to drive home. You will need a responsible individual to drive you home and stay with you for 24 hours after surgery.   If you are taking public transportation, you will need to have a responsible individual with you.  Please call the [facility] at [phone] if you have any questions about these instructions.  Surgery Visitation Policy:  Patients having surgery or a procedure may have two visitors.  Children under the age of 53 must have an adult with them who is not the patient.  Inpatient Visitation:    Visiting hours are 7 a.m. to 8 p.m. Up to four visitors are allowed at one time in a patient room. The visitors may rotate out with other people during the day.  One visitor age 63 or older may stay with the patient overnight and must be in the room by 8 p.m.

## 2023-05-14 ENCOUNTER — Ambulatory Visit
Admission: RE | Admit: 2023-05-14 | Discharge: 2023-05-14 | Disposition: A | Discharge status: Home / Self Care | Payer: Medicaid Other | Attending: Otolaryngology | Admitting: Otolaryngology

## 2023-05-14 ENCOUNTER — Encounter
Admission: RE | Disposition: A | Discharge status: Home / Self Care | Payer: Self-pay | Source: Home / Self Care | Attending: Otolaryngology

## 2023-05-14 ENCOUNTER — Ambulatory Visit: Payer: Medicaid Other

## 2023-05-14 ENCOUNTER — Encounter: Payer: Self-pay | Admitting: Otolaryngology

## 2023-05-14 DIAGNOSIS — H6983 Other specified disorders of Eustachian tube, bilateral: Secondary | ICD-10-CM | POA: Insufficient documentation

## 2023-05-14 DIAGNOSIS — Z87891 Personal history of nicotine dependence: Secondary | ICD-10-CM | POA: Diagnosis not present

## 2023-05-14 HISTORY — PX: MYRINGOTOMY WITH TUBE PLACEMENT: SHX5663

## 2023-05-14 LAB — POCT PREGNANCY, URINE: Preg Test, Ur: NEGATIVE

## 2023-05-14 SURGERY — MYRINGOTOMY WITH TUBE PLACEMENT
Anesthesia: General | Site: Ear | Laterality: Bilateral

## 2023-05-14 MED ORDER — FENTANYL CITRATE (PF) 100 MCG/2ML IJ SOLN
INTRAMUSCULAR | Status: DC | PRN
  Administered 2023-05-14: 100 ug via INTRAVENOUS

## 2023-05-14 MED ORDER — FENTANYL CITRATE (PF) 100 MCG/2ML IJ SOLN
25.0000 ug | INTRAMUSCULAR | Status: DC | PRN
  Administered 2023-05-14 (×3): 25 ug via INTRAVENOUS

## 2023-05-14 MED ORDER — ONDANSETRON HCL 4 MG/2ML IJ SOLN
INTRAMUSCULAR | Status: DC | PRN
  Administered 2023-05-14 (×2): 4 mg via INTRAVENOUS

## 2023-05-14 MED ORDER — KETAMINE HCL 50 MG/5ML IJ SOSY
PREFILLED_SYRINGE | INTRAMUSCULAR | Status: AC
  Filled 2023-05-14: qty 5

## 2023-05-14 MED ORDER — PHENYLEPHRINE HCL (PRESSORS) 10 MG/ML IV SOLN
INTRAVENOUS | Status: DC | PRN
  Administered 2023-05-14: 160 ug via INTRAVENOUS

## 2023-05-14 MED ORDER — GLYCOPYRROLATE 0.2 MG/ML IJ SOLN
INTRAMUSCULAR | Status: DC | PRN
  Administered 2023-05-14: .2 mg via INTRAVENOUS

## 2023-05-14 MED ORDER — LIDOCAINE HCL (PF) 2 % IJ SOLN
INTRAMUSCULAR | Status: AC
  Filled 2023-05-14: qty 10

## 2023-05-14 MED ORDER — VASOPRESSIN 20 UNIT/ML IV SOLN
INTRAVENOUS | Status: AC
  Filled 2023-05-14: qty 3

## 2023-05-14 MED ORDER — LACTATED RINGERS IV SOLN: INTRAVENOUS | Status: DC

## 2023-05-14 MED ORDER — CIPROFLOXACIN-DEXAMETHASONE 0.3-0.1 % OT SUSP
OTIC | Status: AC
  Filled 2023-05-14: qty 7.5

## 2023-05-14 MED ORDER — CHLORHEXIDINE GLUCONATE 0.12 % MT SOLN
15.0000 mL | Freq: Once | OROMUCOSAL | Status: AC
  Administered 2023-05-14: 15 mL via OROMUCOSAL

## 2023-05-14 MED ORDER — IPRATROPIUM-ALBUTEROL 0.5-2.5 (3) MG/3ML IN SOLN
3.0000 mL | Freq: Once | RESPIRATORY_TRACT | Status: AC
  Administered 2023-05-14: 3 mL via RESPIRATORY_TRACT

## 2023-05-14 MED ORDER — PROPOFOL 10 MG/ML IV BOLUS
INTRAVENOUS | Status: DC | PRN
  Administered 2023-05-14: 200 mg via INTRAVENOUS

## 2023-05-14 MED ORDER — SEVOFLURANE IN SOLN
RESPIRATORY_TRACT | Status: AC
  Filled 2023-05-14: qty 250

## 2023-05-14 MED ORDER — EPHEDRINE SULFATE (PRESSORS) 50 MG/ML IJ SOLN
INTRAMUSCULAR | Status: DC | PRN
  Administered 2023-05-14: 5 mg via INTRAVENOUS

## 2023-05-14 MED ORDER — GLYCOPYRROLATE 0.2 MG/ML IJ SOLN
INTRAMUSCULAR | Status: AC
  Filled 2023-05-14: qty 4

## 2023-05-14 MED ORDER — CHLORHEXIDINE GLUCONATE 0.12 % MT SOLN
OROMUCOSAL | Status: AC
  Filled 2023-05-14: qty 15

## 2023-05-14 MED ORDER — DEXAMETHASONE SODIUM PHOSPHATE 10 MG/ML IJ SOLN
INTRAMUSCULAR | Status: AC
  Filled 2023-05-14: qty 1

## 2023-05-14 MED ORDER — ALBUTEROL SULFATE HFA 108 (90 BASE) MCG/ACT IN AERS
INHALATION_SPRAY | RESPIRATORY_TRACT | Status: DC | PRN
  Administered 2023-05-14 (×2): 2 via RESPIRATORY_TRACT
  Administered 2023-05-14: 4 via RESPIRATORY_TRACT
  Administered 2023-05-14: 2 via RESPIRATORY_TRACT

## 2023-05-14 MED ORDER — ORAL CARE MOUTH RINSE: 15.0000 mL | Freq: Once | OROMUCOSAL | Status: AC

## 2023-05-14 MED ORDER — KETAMINE HCL 10 MG/ML IJ SOLN
INTRAMUSCULAR | Status: DC | PRN
  Administered 2023-05-14: 10 mg via INTRAVENOUS
  Administered 2023-05-14 (×2): 20 mg via INTRAVENOUS

## 2023-05-14 MED ORDER — DEXMEDETOMIDINE HCL IN NACL 80 MCG/20ML IV SOLN
INTRAVENOUS | Status: DC | PRN
  Administered 2023-05-14: 8 ug via INTRAVENOUS
  Administered 2023-05-14: 12 ug via INTRAVENOUS

## 2023-05-14 MED ORDER — DEXAMETHASONE SODIUM PHOSPHATE 10 MG/ML IJ SOLN
INTRAMUSCULAR | Status: DC | PRN
  Administered 2023-05-14: 10 mg via INTRAVENOUS

## 2023-05-14 MED ORDER — ONDANSETRON HCL 4 MG/2ML IJ SOLN
INTRAMUSCULAR | Status: AC
  Filled 2023-05-14: qty 2

## 2023-05-14 MED ORDER — LIDOCAINE HCL (CARDIAC) PF 100 MG/5ML IV SOSY
PREFILLED_SYRINGE | INTRAVENOUS | Status: DC | PRN
  Administered 2023-05-14: 100 mg via INTRAVENOUS

## 2023-05-14 MED ORDER — MIDAZOLAM HCL 2 MG/2ML IJ SOLN
INTRAMUSCULAR | Status: AC
  Filled 2023-05-14: qty 2

## 2023-05-14 MED ORDER — PROPOFOL 10 MG/ML IV BOLUS
INTRAVENOUS | Status: AC
  Filled 2023-05-14: qty 40

## 2023-05-14 MED ORDER — IPRATROPIUM-ALBUTEROL 0.5-2.5 (3) MG/3ML IN SOLN
RESPIRATORY_TRACT | Status: AC
  Filled 2023-05-14: qty 3

## 2023-05-14 MED ORDER — MIDAZOLAM HCL 2 MG/2ML IJ SOLN
INTRAMUSCULAR | Status: DC | PRN
  Administered 2023-05-14: 2 mg via INTRAVENOUS

## 2023-05-14 MED ORDER — FENTANYL CITRATE (PF) 100 MCG/2ML IJ SOLN
INTRAMUSCULAR | Status: AC
  Filled 2023-05-14: qty 2

## 2023-05-14 MED ORDER — CIPROFLOXACIN-DEXAMETHASONE 0.3-0.1 % OT SUSP
OTIC | Status: DC | PRN
  Administered 2023-05-14: 4 [drp] via OTIC

## 2023-05-14 SURGICAL SUPPLY — 11 items
BALL CTTN STRL (GAUZE/BANDAGES/DRESSINGS) ×1
BLADE MYR LANCE NRW W/HDL (BLADE) ×1 IMPLANT
COTTON BALL STERILE (GAUZE/BANDAGES/DRESSINGS) ×1
COTTON BALL STERILE 4 PK (GAUZE/BANDAGES/DRESSINGS) ×1 IMPLANT
GLOVE BIO SURGEON STRL SZ7.5 (GLOVE) ×1 IMPLANT
MANIFOLD NEPTUNE II (INSTRUMENTS) ×1 IMPLANT
TOWEL OR 17X26 4PK STRL BLUE (TOWEL DISPOSABLE) ×1 IMPLANT
TRAP FLUID SMOKE EVACUATOR (MISCELLANEOUS) ×1 IMPLANT
TUBE EAR T 1.27X4.5 GO LF (OTOLOGIC RELATED) IMPLANT
TUBING CONNECTING 10 (TUBING) ×1 IMPLANT
WATER STERILE IRR 500ML POUR (IV SOLUTION) ×1 IMPLANT

## 2023-05-14 NOTE — Anesthesia Preprocedure Evaluation (Addendum)
Anesthesia Evaluation  Patient identified by MRN, date of birth, ID band Patient awake    Reviewed: Allergy & Precautions, NPO status , Patient's Chart, lab work & pertinent test results  History of Anesthesia Complications Negative for: history of anesthetic complications  Airway Mallampati: II  TM Distance: >3 FB Neck ROM: full    Dental no notable dental hx.    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  For about 2 months w/               on/off pain associated. Check U/A, A1c, and BMP. No date: Migraines No date: Seizures (HCC) No date: Stroke [facility])     Comment:  had 3 mini strokes No date: TIA (transient ischemic attack)  Past Surgical History: No date: c section 2010: FOOT SURGERY; Left 04/1991: KIDNEY SURGERY 1996: MYRINGOTOMY No date: WISDOM TOOTH EXTRACTION     Comment:  at the age of 13  BMI    Body Mass Index: 48.24 kg/m      Reproductive/Obstetrics negative OB ROS                             Anesthesia Physical Anesthesia Plan  ASA: 3  Anesthesia Plan: General    Post-op Pain Management: Ofirmev IV (intra-op)* and Toradol IV (intra-op)*   Induction: Intravenous  PONV Risk Score and Plan: 3 and Propofol infusion and TIVA  Airway Management Planned: LMA  Additional Equipment:   Intra-op Plan:   Post-operative Plan: Extubation in OR  Informed Consent: I have reviewed the patients History and Physical, chart, labs and discussed the procedure including the risks, benefits and alternatives for the proposed anesthesia with the patient or authorized representative who has indicated his/her understanding and acceptance.     Dental Advisory Given  Plan Discussed with: Anesthesiologist, CRNA and Surgeon  Anesthesia Plan Comments: (Patient consented for risks of anesthesia including but not limited to:  - adverse reactions to medications - damage to eyes, teeth, lips or other oral mucosa - nerve damage due to positioning  - sore throat or hoarseness - Damage to heart, brain, nerves, lungs, other parts of body or loss of life  Patient voiced understanding.)        Anesthesia Quick Evaluation

## 2023-05-14 NOTE — Anesthesia Postprocedure Evaluation (Signed)
Anesthesia Post Note  Patient: Karen Hunt  Procedure(s) Performed: MYRINGOTOMY WITH TUBE PLACEMENT (Bilateral: Ear)  Patient location during evaluation: PACU Anesthesia Type: General Level of consciousness: awake and alert Pain management: pain level controlled Vital Signs Assessment: post-procedure vital signs reviewed and stable Respiratory status: spontaneous breathing, nonlabored ventilation, respiratory function stable and patient connected to nasal cannula oxygen Cardiovascular status: blood pressure returned to baseline and stable Postop Assessment: no apparent nausea or vomiting Anesthetic complications: no   No notable events documented.   Last Vitals:  Vitals:   05/14/23 0825 05/14/23 0830  BP:  120/87  Pulse: 73 74  Resp: 10 13  Temp:  (!) 36.1 C  SpO2: 98% 95%    Last Pain:  Vitals:   05/14/23 0830  TempSrc:   PainSc: 4                  Louie Boston

## 2023-05-14 NOTE — Op Note (Signed)
05/14/2023  7:50 AM    Toni Amend Lorin Picket  [id number]   Pre-Op Diagnosis:  Chronic eustachian tube dysfunction  Post-op Diagnosis: SAME  Procedure: Bilateral myringotomy with T-tube placement  Surgeon:  Sandi Mealy., MD  Anesthesia:  General anesthesia with masked ventilation  EBL:  Minimal  Complications:  None  Findings: TM retraction and sclerotic change  Procedure: The patient was taken to the Operating Room and placed in the supine position.  After induction of general anesthesia with mask ventilation, the right ear was evaluated under the operating microscope and the canal cleaned. The findings were as described above.  An anterior inferior radial myringotomy incision was performed.  Mucous was suctioned from the middle ear.  A T-tube was placed without difficulty.  Ciprodex otic solution was instilled into the external canal, and insufflated into the middle ear.  A cotton ball was placed at the external meatus.  Attention was then turned to the left ear. The same procedure was then performed on this side in the same fashion.  The patient was then returned to the anesthesiologist for awakening, and was taken to the Recovery Room in stable condition.  Cultures:  None.  Disposition:   PACU then discharge home  Plan: Antibiotic ear drops as prescribed and water precautions.  Recheck my office three weeks.  Sandi Mealy 05/14/2023 7:50 AM

## 2023-05-14 NOTE — Discharge Instructions (Signed)
AMBULATORY SURGERY  ?DISCHARGE INSTRUCTIONS ? ? ?The drugs that you were given will stay in your system until tomorrow so for the next 24 hours you should not: ? ?Drive an automobile ?Make any legal decisions ?Drink any alcoholic beverage ? ? ?You may resume regular meals tomorrow.  Today it is better to start with liquids and gradually work up to solid foods. ? ?You may eat anything you prefer, but it is better to start with liquids, then soup and crackers, and gradually work up to solid foods. ? ? ?Please notify your doctor immediately if you have any unusual bleeding, trouble breathing, redness and pain at the surgery site, drainage, fever, or pain not relieved by medication. ? ? ? ?Additional Instructions: ? ? ? ?Please contact your physician with any problems or Same Day Surgery at [phone], Monday through Friday 6 am to 4 pm, or [place] [facility] [place] [facility] number at [phone].  ?

## 2023-05-14 NOTE — Transfer of Care (Signed)
Immediate Anesthesia Transfer of Care Note  Patient: Karen Hunt  Procedure(s) Performed: MYRINGOTOMY WITH TUBE PLACEMENT (Bilateral: Ear)  Patient Location: PACU  Anesthesia Type:General  Level of Consciousness: awake, drowsy, and patient cooperative  Airway & Oxygen Therapy: Patient Spontanous Breathing and Patient connected to face mask oxygen  Post-op Assessment: Report given to RN and Post -op Vital signs reviewed and stable  Post vital signs: Reviewed and stable  Last Vitals:  Vitals Value Taken Time  BP 142/89 05/14/23 0804  Temp 36.2 C 05/14/23 0804  Pulse 80 05/14/23 0811  Resp 15 05/14/23 0811  SpO2 97 % 05/14/23 0811  Vitals shown include unvalidated device data.  Last Pain:  Vitals:   05/14/23 0624  TempSrc: Tympanic  PainSc: 6       Patients Stated Pain Goal: 1 (05/14/23 6440)  Complications: No notable events documented.

## 2023-05-14 NOTE — H&P (Signed)
History and physical reviewed and will be scanned in later. No change in medical status reported by the patient or family, appears stable for surgery. All questions regarding the procedure answered, and patient (or family if a child) expressed understanding of the procedure. ? ?Karen Hunt S Karen Hunt ?@TODAY@ ?

## 2023-05-14 NOTE — Anesthesia Procedure Notes (Signed)
Procedure Name: Intubation Date/Time: 05/14/2023 7:30 AM  Performed by: Mohammed Kindle, CRNAPre-anesthesia Checklist: Patient identified, Emergency Drugs available, Suction available and Patient being monitored Patient Re-evaluated:Patient Re-evaluated prior to induction Oxygen Delivery Method: Circle system utilized Preoxygenation: Pre-oxygenation with 100% oxygen Induction Type: IV induction Ventilation: Mask ventilation without difficulty Laryngoscope Size: McGraph and 3 Grade View: Grade I Tube type: Oral Number of attempts: 1 Airway Equipment and Method: Stylet Placement Confirmation: ETT inserted through vocal cords under direct vision, positive ETCO2 and breath sounds checked- equal and bilateral Secured at: 21 cm Tube secured with: Tape Dental Injury: Teeth and Oropharynx as per pre-operative assessment  Comments: Inserted by Hassel Neth

## 2023-05-15 ENCOUNTER — Encounter: Payer: Self-pay | Admitting: Otolaryngology

## 2023-06-25 ENCOUNTER — Ambulatory Visit
Admission: EM | Admit: 2023-06-25 | Discharge: 2023-06-25 | Disposition: A | Discharge status: Home / Self Care | Payer: Medicaid Other | Attending: Family Medicine | Admitting: Family Medicine

## 2023-06-25 DIAGNOSIS — H9203 Otalgia, bilateral: Secondary | ICD-10-CM | POA: Insufficient documentation

## 2023-06-25 DIAGNOSIS — R509 Fever, unspecified: Secondary | ICD-10-CM | POA: Diagnosis present

## 2023-06-25 DIAGNOSIS — U071 COVID-19: Secondary | ICD-10-CM | POA: Insufficient documentation

## 2023-06-25 DIAGNOSIS — R051 Acute cough: Secondary | ICD-10-CM | POA: Diagnosis present

## 2023-06-25 LAB — BASIC METABOLIC PANEL
Anion gap: 6 (ref 5–15)
BUN: 8 mg/dL (ref 6–20)
CO2: 21 mmol/L — ABNORMAL LOW (ref 22–32)
Calcium: 8.2 mg/dL — ABNORMAL LOW (ref 8.9–10.3)
Chloride: 104 mmol/L (ref 98–111)
Creatinine, Ser: 0.96 mg/dL (ref 0.44–1.00)
GFR, Estimated: >60 mL/min (ref >60)
Glucose, Bld: 77 mg/dL (ref 70–99)
Potassium: 4 mmol/L (ref 3.5–5.1)
Sodium: 131 mmol/L — ABNORMAL LOW (ref 135–145)

## 2023-06-25 LAB — SARS CORONAVIRUS 2 BY RT PCR: SARS Coronavirus 2 by RT PCR: POSITIVE — AB

## 2023-06-25 MED ORDER — NIRMATRELVIR/RITONAVIR (PAXLOVID)TABLET: 3.0000 | ORAL_TABLET | Freq: Two times a day (BID) | ORAL | 0 refills | Status: AC

## 2023-06-25 NOTE — ED Triage Notes (Signed)
Pt c/o bilateral ear pain x 2 weeks.   States she started having a cough and woke up this morning with body aches and fever. Reports her at home covid test was positive.

## 2023-06-25 NOTE — Discharge Instructions (Addendum)
-  COVID is + -I have sent Paxlovid to pharmacy. If you are on a statin for cholesterol make sure not to take it for the next 10 days. -There is no ear infection. The discomfort is likely related to congestion. Consider Mucinex D and Flonase -Take Tylenol and Motrin as needed for fever and aches -Isolate until fever free >24 hours and symptoms are improving.  -Return for any uncontrollable fever, weakness, breathing difficulty

## 2023-06-25 NOTE — ED Provider Notes (Signed)
[facility]    CSN: [id number] Arrival date & time: 06/25/23  1603      History   Chief Complaint Chief Complaint  Patient presents with   Fever    Home test positive for covid. Fever. Muscle aches. Weakness. Headache. - Entered by patient    HPI Karen Hunt is a 32 y.o. female presenting for 5-day history of bilateral ear pain/pressure.  Patient reports today she woke up with a cough, body aches and a low-grade fever.  Also reports fatigue and nasal congestion.  No report of sore throat, chest pain, shortness of breath.  Patient reports she took a COVID test at home and it was immediately positive.  She has had COVID before.  She reports tympanostomy tubes placed about 6 weeks ago.  She has not noted any drainage from her ears.  She has taken over-the-counter medicine for symptoms.  No other complaints.  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  For about 2 months w/ on/off pain associated. Check U/A, A1c, and BMP.   Migraines    Seizures (HCC)    Stroke (HCC)    had 3 mini strokes   TIA (transient ischemic attack)     Patient Active Problem List   Diagnosis Date Noted   Acute bilateral low back pain 10/03/2021   Gastroesophageal reflux disease 07/03/2021   Increased frequency of urination 07/03/2021   Class 3 severe obesity due to excess calories with body mass index (BMI) of 45.0 to 49.9 in adult [facility]) 12/28/2019   History of ischemic stroke 11/02/2019   Seizure disorder as sequela of cerebrovascular accident (HCC) 11/02/2019   Tobacco use disorder 10/22/2019   Nonintractable episodic headache 10/19/2019   Healthcare maintenance 08/17/2018   Recurrent major depressive disorder, in partial  remission (HCC) 08/17/2018   Recurrent acute suppurative otitis media of right ear without spontaneous rupture of tympanic membrane 10/03/2015   Hypertension 09/22/2011    Past Surgical History:  Procedure Laterality Date   c section     FOOT SURGERY Left 2010   KIDNEY SURGERY  04/1991   MYRINGOTOMY  1996   MYRINGOTOMY WITH TUBE PLACEMENT Bilateral 05/14/2023   Procedure: MYRINGOTOMY WITH TUBE PLACEMENT;  Surgeon: Geanie Logan, MD;  Location: [facility] ORS;  Service: ENT;  Laterality: Bilateral;   WISDOM TOOTH EXTRACTION     at the age of 18    OB History     Gravida  2   Para      Term      Preterm      AB  1   Living  1      SAB  1   IAB      Ectopic      Multiple      Live Births               Home Medications    Prior to Admission medications   Medication Sig Start Date End Date Taking? Authorizing Provider  nirmatrelvir/ritonavir (PAXLOVID) 20 x 150 MG & 10 x 100MG  TABS Take 3 tablets by mouth 2 (two) times daily for 5 days. Patient GFR is over 60 Take nirmatrelvir (150  mg) two tablets twice daily for 5 days and ritonavir (100 mg) one tablet twice daily for 5 days. 06/25/23 06/30/23 Yes Shirlee Latch, PA-C  AIMOVIG 140 MG/ML SOAJ SMARTSIG:1 Milliliter(s) SUB-Q Every 4 Weeks 10/18/22   [provider]  atorvastatin (LIPITOR) 80 MG tablet Take by mouth. 07/26/21 05/14/23  [provider]  cyanocobalamin 100 MCG tablet Take 100 mcg by mouth daily.    [provider]  escitalopram (LEXAPRO) 10 MG tablet Take by mouth.    [provider]  famotidine (PEPCID) 20 MG tablet Take 1 tablet by mouth 2 (two) times daily. 09/19/22   [provider]  levonorgestrel (MIRENA) 20 MCG/24HR IUD 1 each by Intrauterine route once.    [provider]  magnesium 30 MG tablet Take 30 mg by mouth 2 (two) times daily.    [provider]  traZODone (DESYREL) 50 MG tablet Take 50 mg by mouth at bedtime.    [provider]    Family History No family history on file.  Social History Social History   Tobacco Use   Smoking status: Former    Packs/day: 1    Types: Cigarettes    Quit date: 05/12/2023    Years since quitting: 0.1   Smokeless tobacco: Never  Vaping Use   Vaping Use: Former  Substance Use Topics   Alcohol use: Yes    Comment: rare   Drug use: Yes    Types: Marijuana     Allergies   Ciprofloxacin   Review of Systems Review of Systems  Constitutional:  Positive for fatigue and fever. Negative for chills and diaphoresis.  HENT:  Positive for congestion, ear pain and rhinorrhea. Negative for ear discharge, sinus pressure, sinus pain and sore throat.   Respiratory:  Positive for cough. Negative for shortness of breath.   Cardiovascular:  Negative for chest pain.  Gastrointestinal:  Negative for abdominal pain, nausea and vomiting.  Musculoskeletal:  Positive for myalgias.  Skin:  Negative for rash.  Neurological:  Positive for headaches. Negative for weakness.  Hematological:  Negative for adenopathy.     Physical Exam Triage Vital Signs ED Triage Vitals  Enc Vitals Group     BP      Pulse      Resp      Temp      Temp src      SpO2      Weight      Height      Head Circumference      Peak Flow      Pain Score      Pain Loc      Pain Edu?      Excl. in GC?    No data found.  Updated Vital Signs BP 124/66 (BP Location: Left Arm)   Pulse 92   Temp 99.5 F (37.5 C) (Oral)   Resp 17   SpO2 98%      Physical Exam Vitals and nursing note reviewed.  Constitutional:      General: She is not in acute distress.    Appearance: Normal appearance. She is ill-appearing. She is not toxic-appearing.  HENT:     Head: Normocephalic and atraumatic.     Right Ear: Tympanic membrane, ear canal and external ear normal.     Left Ear: Tympanic membrane, ear canal and external ear normal.     Ears:     Comments: Green tympanostomy tubes in place  bilaterally.  Nose: Congestion present.     Mouth/Throat:     Mouth: Mucous membranes are moist.     Pharynx: Oropharynx is clear.  Eyes:     General: No scleral icterus.       Right eye: No discharge.        Left eye: No discharge.     Conjunctiva/sclera: Conjunctivae normal.  Cardiovascular:     Rate and Rhythm: Normal rate and regular rhythm.     Heart sounds: Normal heart sounds.  Pulmonary:     Effort: Pulmonary effort is normal. No respiratory distress.     Breath sounds: Normal breath sounds.  Musculoskeletal:     Cervical back: Neck supple.  Skin:    General: Skin is dry.  Neurological:     General: No focal deficit present.     Mental Status: She is alert. Mental status is at baseline.     Motor: No weakness.     Gait: Gait normal.  Psychiatric:        Mood and Affect: Mood normal.        Behavior: Behavior normal.        Thought Content: Thought content normal.      UC Treatments / Results  Labs (all labs ordered are listed, but only abnormal results are displayed) Labs Reviewed  SARS CORONAVIRUS 2 BY RT PCR - Abnormal; Notable for the following components:      Result Value   SARS Coronavirus 2 by RT PCR POSITIVE (*)    All other components within normal limits  BASIC METABOLIC PANEL - Abnormal; Notable for the following components:   Sodium 131 (*)    CO2 21 (*)    Calcium 8.2 (*)    All other components within normal limits    EKG   Radiology No results found.  Procedures Procedures (including critical care time)  Medications Ordered in UC Medications - No data to display  Initial Impression / Assessment and Plan / UC Course  I have reviewed the triage vital signs and the nursing notes.  Pertinent labs & imaging results that were available during my care of the patient were reviewed by me and considered in my medical decision making (see chart for details).   32 year old female presents for bilateral ear pain for the past few days.  She  reports fever, fatigue, body aches, cough, congestion that began today.  Positive home COVID test.  She is interested in antiviral medication if possible.  Vitals are normal and stable.  She is ill-appearing but nontoxic.  On exam there is no evidence of an ear infection and tympanostomy tubes are in place.  No drainage noted.  She does have nasal congestion.  Throat is clear.  Chest clear to auscultation.  PCR COVID test obtained.  Also ordered BMP to check her GFR in case she is positive for COVID so that I may prescribe her Paxlovid. +COVID. GFR >60.  Reviewed results with patient. Reviewed current CDC guidelines, isolation protocol and ED precautions. Sent Paxlovid. Reviewed use of OTC meds. Work note given.   1 acute illness with systemic symptoms.   Final Clinical Impressions(s) / UC Diagnoses   Final diagnoses:  COVID-19  Acute cough  Fever, unspecified fever cause  Acute ear pain, bilateral     Discharge Instructions      -COVID is + -I have sent Paxlovid to pharmacy. If you are on a statin for cholesterol make sure not to take it for the next 10  days. -There is no ear infection. The discomfort is likely related to congestion. Consider Mucinex D and Flonase -Take Tylenol and Motrin as needed for fever and aches -Isolate until fever free >24 hours and symptoms are improving.  -Return for any uncontrollable fever, weakness, breathing difficulty     ED Prescriptions     Medication Sig Dispense Auth. Provider   nirmatrelvir/ritonavir (PAXLOVID) 20 x 150 MG & 10 x 100MG  TABS Take 3 tablets by mouth 2 (two) times daily for 5 days. Patient GFR is over 60 Take nirmatrelvir (150 mg) two tablets twice daily for 5 days and ritonavir (100 mg) one tablet twice daily for 5 days. 30 tablet Gareth Morgan      PDMP not reviewed this encounter.   Shirlee Latch, PA-C 06/25/23 1743

## 2023-06-26 ENCOUNTER — Ambulatory Visit: Payer: Medicaid Other

## 2024-01-14 ENCOUNTER — Ambulatory Visit
Admission: EM | Admit: 2024-01-14 | Discharge: 2024-01-14 | Disposition: A | Discharge status: Home / Self Care | Payer: Medicaid Other | Attending: Family Medicine | Admitting: Family Medicine

## 2024-01-14 ENCOUNTER — Ambulatory Visit (INDEPENDENT_AMBULATORY_CARE_PROVIDER_SITE_OTHER): Payer: Medicaid Other

## 2024-01-14 DIAGNOSIS — M79671 Pain in right foot: Secondary | ICD-10-CM | POA: Diagnosis not present

## 2024-01-14 MED ORDER — NAPROXEN 500 MG PO TABS: 500.0000 mg | ORAL_TABLET | Freq: Two times a day (BID) | ORAL | 0 refills | Status: DC

## 2024-01-14 NOTE — Discharge Instructions (Addendum)
 Take Naprosyn  as needed for foot pain. Wear you hard soled shoe with activity.  Follow up with Dr Althea Atkinson who is a podiatrist. You can take Tylenol  for additional pain relief.   On my review of your xray images, you did not have any fractures or dislocated bones. The radiologist has not yet read your xray. If it is significantly abnormal or urgent, someone will contact you.  You should see your results in MyChart.

## 2024-01-14 NOTE — ED Triage Notes (Signed)
 Pt with c/o right inner foot pain after stomping hard to remove snow from her boots over the weekend. Patient states she has tried heat and ice with no relief.

## 2024-01-14 NOTE — ED Provider Notes (Signed)
 [facility]    CSN: [id number] Arrival date & time: 01/14/24  1320      History   Chief Complaint Chief Complaint  Patient presents with   Foot Pain    HPI  HPI Karen Hunt is a 33 y.o. female.   Karen Hunt presents for right inner foot pain after stomping snow off her shoes this past weekend.  Patient reports she wanted to get the normal for shoes so she stomped really hard on the ground.  She had immediate pain shoot down through her big toe.  Continues to have pain in this area.  Has been taking Tylenol  without relief.  Has pain with walking.  She works in [employer] and has to stand on her feet for multiple hours in a day.  Has been applying ice and heat without relief.  Has history of foot pain for a while now and even had to have a small piece of bone removed on the left lateral foot.  He does not follow with a podiatrist.     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  For about 2 months w/ on/off pain associated. Check U/A, A1c, and BMP.   Migraines    Seizures (HCC)    Stroke (HCC)    had 3 mini strokes   TIA (transient ischemic attack)     Patient Active Problem List   Diagnosis Date Noted   Acute bilateral low back pain 10/03/2021   Gastroesophageal reflux disease 07/03/2021   Increased frequency of urination 07/03/2021   Class 3 severe obesity due to excess calories with body mass index (BMI) of 45.0 to 49.9 in adult [facility]) 12/28/2019   History of ischemic stroke 11/02/2019   Seizure disorder as sequela of cerebrovascular accident (HCC) 11/02/2019   Tobacco use disorder 10/22/2019   Nonintractable episodic headache 10/19/2019   Healthcare maintenance 08/17/2018   Recurrent major  depressive disorder, in partial remission (HCC) 08/17/2018   Recurrent acute suppurative otitis media of right ear without spontaneous rupture of tympanic membrane 10/03/2015   Hypertension 09/22/2011    Past Surgical History:  Procedure Laterality Date   c section     FOOT SURGERY Left 2010   KIDNEY SURGERY  04/1991   MYRINGOTOMY  1996   MYRINGOTOMY WITH TUBE PLACEMENT Bilateral 05/14/2023   Procedure: MYRINGOTOMY WITH TUBE PLACEMENT;  Surgeon: Von Grumbling, MD;  Location: [facility] ORS;  Service: ENT;  Laterality: Bilateral;   WISDOM TOOTH EXTRACTION     at the age of 61    OB History     Gravida  2   Para      Term      Preterm      AB  1   Living  1      SAB  1   IAB      Ectopic      Multiple      Live Births               Home Medications    Prior to Admission medications   Medication Sig Start Date End Date Taking? Authorizing Provider  clindamycin (CLEOCIN T) 1 % lotion Apply topically 2 (  two) times daily. 09/30/23 09/29/24 Yes [provider]  clobetasol ointment (TEMOVATE) 0.05 % Apply 1 Application topically 2 (two) times daily. 09/30/23 09/29/24 Yes [provider]  hydrOXYzine (ATARAX) 25 MG tablet Take 25 mg by mouth every 8 (eight) hours as needed for anxiety. 12/04/23  Yes [provider]  ketoconazole (NIZORAL) 2 % cream Apply 1 Application topically daily. 07/15/23  Yes [provider]  ketoconazole (NIZORAL) 2 % shampoo Apply 1 Application topically 2 (two) times a week. 07/15/23  Yes [provider]  metFORMIN (GLUCOPHAGE) 500 MG tablet Take 500 mg by mouth daily with breakfast. 09/23/23 09/22/24 Yes [provider]  naproxen  (NAPROSYN ) 500 MG tablet Take 1 tablet (500 mg total) by mouth 2 (two) times daily with a meal. 01/14/24  Yes Augustino Savastano, DO  VENTOLIN  HFA 108 (90 Base) MCG/ACT inhaler Inhale 2 puffs into the lungs every 6 (six) hours as needed. 01/09/24  Yes [provider]   AIMOVIG 140 MG/ML SOAJ SMARTSIG:1 Milliliter(s) SUB-Q Every 4 Weeks 10/18/22   [provider]  atorvastatin (LIPITOR) 80 MG tablet Take by mouth. 07/26/21 05/14/23  [provider]  cyanocobalamin 100 MCG tablet Take 100 mcg by mouth daily.    [provider]  escitalopram (LEXAPRO) 10 MG tablet Take by mouth.    [provider]  famotidine  (PEPCID ) 20 MG tablet Take 1 tablet by mouth 2 (two) times daily. 09/19/22   [provider]  levonorgestrel (MIRENA) 20 MCG/24HR IUD 1 each by Intrauterine route once.    [provider]  magnesium 30 MG tablet Take 30 mg by mouth 2 (two) times daily.    [provider]  NURTEC 75 MG TBDP Take by mouth.    [provider]  WEGOVY 0.5 MG/0.5ML SOAJ Inject 0.5 mg into the skin once a week.    [provider]    Family History History reviewed. No pertinent family history.  Social History Social History   Tobacco Use   Smoking status: Former    Current packs/day: 0.00    Types: Cigarettes    Quit date: 05/12/2023    Years since quitting: 0.6   Smokeless tobacco: Never  Vaping Use   Vaping status: Former  Substance Use Topics   Alcohol use: Yes    Comment: rare   Drug use: Yes    Types: Marijuana     Allergies   Ciprofloxacin    Review of Systems Review of Systems: :negative unless otherwise stated in HPI.      Physical Exam Triage Vital Signs ED Triage Vitals  Encounter Vitals Group     BP 01/14/24 1409 (!) 151/94     Systolic BP Percentile --      Diastolic BP Percentile --      Pulse Rate 01/14/24 1409 77     Resp 01/14/24 1409 18     Temp 01/14/24 1409 98.8 F (37.1 C)     Temp Source 01/14/24 1409 Oral     SpO2 01/14/24 1409 95 %     Weight --      Height --      Head Circumference --      Peak Flow --      Pain Score 01/14/24 1410 7     Pain Loc --      Pain Education --      Exclude from Growth Chart --    No data found.  Updated  Vital Signs BP (!) 151/94 (BP  Location: Left Arm)   Pulse 77   Temp 98.8 F (37.1 C) (Oral)   Resp 18   SpO2 95%   Visual Acuity Right Eye Distance:   Left Eye Distance:   Bilateral Distance:    Right Eye Near:   Left Eye Near:    Bilateral Near:     Physical Exam GEN: well appearing female in no acute distress  CVS: well perfused  RESP: speaking in full sentences without pause, no respiratory distress  MSK:   Ankle/Foot, right: TTP noted along the first metatarsal including the first MTP. No visible erythema, ecchymosis, or bony deformity.  Has some mild edema.  Calluses present on the bottom of the foot.  No evidence of tibiotalar deviation; Range of motion is full in all directions. Strength is 5/5 in all directions. No tenderness at the insertion/body/myotendinous junction of the Achilles tendon; No tenderness on posterior aspects of lateral and medial malleolus; Talar dome non-tender; Unremarkable calcaneal squeeze; No plantar calcaneal tenderness; No tenderness over the navicular prominence or  over cuboid; No pain at base of 5th MT; No tenderness at the distal 2nd -5th metatarsals; Able to walk 4 steps but with pain .     UC Treatments / Results  Labs (all labs ordered are listed, but only abnormal results are displayed) Labs Reviewed - No data to display  EKG   Radiology DG Foot Complete Right Result Date: 01/14/2024 CLINICAL DATA:  Pain and injury EXAM: RIGHT FOOT COMPLETE - 3+ VIEW COMPARISON:  Right ankle x-ray 11/04/2021 FINDINGS: There is no evidence of fracture or dislocation. There is no evidence of arthropathy or other focal bone abnormality. Soft tissues are unremarkable. IMPRESSION: Negative. Electronically Signed   By: Tyron Gallon M.D.   On: 01/14/2024 16:13     Procedures Procedures (including critical care time)  Medications Ordered in UC Medications - No data to display  Initial Impression / Assessment and Plan / UC Course  I have reviewed the  triage vital signs and the nursing notes.  Pertinent labs & imaging results that were available during my care of the patient were reviewed by me and considered in my medical decision making (see chart for details).      Pt is a 33 y.o.  female with 4 days of right foot pain pain after stomping snow off her feet.   On exam, pt has tenderness at first metatarsal shaft and first MTP concerning for possible fracture.   Obtained right foot plain films.  Personally interpreted by me were unremarkable for fracture or dislocation.   Patient aware the radiologist has not read her xray and is comfortable with the preliminary read by me. Will review radiologist read when available and call patient if a change in plan is warranted.  Pt agreeable to this plan prior to discharge.   Patient to gradually return to normal activities, as tolerated and continue ordinary activities within the limits permitted by pain. Prescribed Naproxen  sodium  for pain relief.  Tylenol  PRN. Advised patient to avoid OTC NSAIDs while taking prescription NSAID.   Patient to follow up with diabetes/orthopedic provider, if symptoms do not improve with conservative treatment.  Return and ED precautions given. Understanding voiced. Discussed MDM, treatment plan and plan for follow-up with patient who agrees with plan.   Radiologist impression reviewed  Final Clinical Impressions(s) / UC Diagnoses   Final diagnoses:  Foot pain, right     Discharge Instructions      Take Naprosyn  as needed  for foot pain. Wear you hard soled shoe with activity.  Follow up with Dr Althea Atkinson who is a podiatrist. You can take Tylenol  for additional pain relief.   On my review of your xray images, you did not have any fractures or dislocated bones. The radiologist has not yet read your xray. If it is significantly abnormal or urgent, someone will contact you.  You should see your results in MyChart.       ED Prescriptions     Medication Sig  Dispense Auth. Provider   naproxen  (NAPROSYN ) 500 MG tablet Take 1 tablet (500 mg total) by mouth 2 (two) times daily with a meal. 30 tablet Addy Mcmannis, DO      PDMP not reviewed this encounter.   Fidel Huddle, DO 01/14/24 1755

## 2024-01-30 ENCOUNTER — Ambulatory Visit (INDEPENDENT_AMBULATORY_CARE_PROVIDER_SITE_OTHER): Payer: Medicaid Other

## 2024-01-30 ENCOUNTER — Encounter: Payer: Self-pay | Admitting: Physician Assistant

## 2024-01-30 ENCOUNTER — Ambulatory Visit
Admission: EM | Admit: 2024-01-30 | Discharge: 2024-01-30 | Disposition: A | Discharge status: Home / Self Care | Payer: Medicaid Other | Attending: Physician Assistant | Admitting: Physician Assistant

## 2024-01-30 DIAGNOSIS — R062 Wheezing: Secondary | ICD-10-CM

## 2024-01-30 DIAGNOSIS — R051 Acute cough: Secondary | ICD-10-CM | POA: Diagnosis not present

## 2024-01-30 DIAGNOSIS — J45901 Unspecified asthma with (acute) exacerbation: Secondary | ICD-10-CM | POA: Diagnosis not present

## 2024-01-30 MED ORDER — PROMETHAZINE-DM 6.25-15 MG/5ML PO SYRP: 5.0000 mL | ORAL_SOLUTION | Freq: Four times a day (QID) | ORAL | 0 refills | Status: DC | PRN

## 2024-01-30 MED ORDER — DEXAMETHASONE SODIUM PHOSPHATE 10 MG/ML IJ SOLN
10.0000 mg | Freq: Once | INTRAMUSCULAR | Status: AC
  Administered 2024-01-30: 10 mg via INTRAMUSCULAR

## 2024-01-30 MED ORDER — IPRATROPIUM-ALBUTEROL 0.5-2.5 (3) MG/3ML IN SOLN
3.0000 mL | Freq: Once | RESPIRATORY_TRACT | Status: AC
  Administered 2024-01-30: 3 mL via RESPIRATORY_TRACT

## 2024-01-30 MED ORDER — PREDNISONE 10 MG PO TABS: ORAL_TABLET | ORAL | 0 refills | Status: DC

## 2024-01-30 NOTE — Discharge Instructions (Addendum)
-  I will contact you if the radiologist sees anything abnormal on the x-ray other than bronchitis or asthma. - You were given a breathing treatment, corticosteroid injection in the clinic.  Sent prednisone for you to start tomorrow.  I also sent cough medicine. - Keep your appointment with pulmonology in March. - Increase rest and fluids. - Return if fever or worsening symptoms.  Go to ER for acute worsening of symptoms.

## 2024-01-30 NOTE — ED Provider Notes (Signed)
[facility]    CSN: [id number] Arrival date & time: 01/30/24  1507      History   Chief Complaint Chief Complaint  Patient presents with   Wheezing   Shortness of Breath    HPI Karen Hunt is a 33 y.o. female presenting for cough, wheezing, and shortness of breath which has gotten worse for the past 3 days.  Reports she has actually had intermittent wheezing and shortness of breath for the past 7 months.  Has not right with pulmonology for asthma/COPD testing in March.  Reports that she was seen at the emergency department about 2 months ago and tested positive for rhinovirus.  She had significant wheezing and shortness of breath at that time and was given multiple breathing treatments and corticosteroids.  Has been using albuterol inhaler at home but thinks it is not strong enough and she may need corticosteroids.  Patient is a former smoker.  Other medical history includes anxiety, hypertension, migraines, and chronic back pain.  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  For about 2 months w/ on/off pain associated. Check U/A, A1c, and BMP.   Migraines    Seizures (HCC)    Stroke (HCC)    had 3 mini strokes   TIA (transient ischemic attack)     Patient Active Problem List   Diagnosis Date Noted   Acute bilateral low back pain 10/03/2021   Gastroesophageal reflux disease 07/03/2021   Increased frequency of urination 07/03/2021   Class 3 severe obesity due to excess calories with body mass index (BMI) of 45.0 to 49.9 in adult [facility]) 12/28/2019   History of ischemic stroke 11/02/2019   Seizure disorder as sequela of cerebrovascular accident (HCC) 11/02/2019   Tobacco use disorder 10/22/2019    Nonintractable episodic headache 10/19/2019   Healthcare maintenance 08/17/2018   Recurrent major depressive disorder, in partial remission (HCC) 08/17/2018   Recurrent acute suppurative otitis media of right ear without spontaneous rupture of tympanic membrane 10/03/2015   Hypertension 09/22/2011    Past Surgical History:  Procedure Laterality Date   c section     FOOT SURGERY Left 2010   KIDNEY SURGERY  04/1991   MYRINGOTOMY  1996   MYRINGOTOMY WITH TUBE PLACEMENT Bilateral 05/14/2023   Procedure: MYRINGOTOMY WITH TUBE PLACEMENT;  Surgeon: Geanie Logan, MD;  Location: [facility] ORS;  Service: ENT;  Laterality: Bilateral;   WISDOM TOOTH EXTRACTION     at the age of 85    OB History     Gravida  2   Para      Term      Preterm      AB  1   Living  1      SAB  1   IAB      Ectopic      Multiple      Live Births               Home Medications    Prior to Admission medications   Medication Sig Start Date End Date Taking? Authorizing Provider  AIMOVIG 140 MG/ML SOAJ SMARTSIG:1 Milliliter(s) SUB-Q Every 4 Weeks 10/18/22  Yes [provider]  escitalopram (LEXAPRO) 10 MG tablet Take by mouth.   Yes [provider]  famotidine (PEPCID) 20 MG tablet Take 1 tablet by mouth 2 (two) times daily. 09/19/22  Yes [provider]  hydrOXYzine (ATARAX) 25 MG tablet Take 25 mg by mouth every 8 (eight) hours as needed for anxiety. 12/04/23  Yes [provider]  levonorgestrel (MIRENA) 20 MCG/24HR IUD 1 each by Intrauterine route once.   Yes [provider]  magnesium 30 MG tablet Take 30 mg by mouth 2 (two) times daily.   Yes [provider]  metFORMIN (GLUCOPHAGE) 500 MG tablet Take 500 mg by mouth daily with breakfast. 09/23/23 09/22/24 Yes [provider]  NURTEC 75 MG TBDP Take by mouth.   Yes [provider]  predniSONE (DELTASONE) 10 MG tablet Take 6 tabs p.o. on day 1 and decrease by 1 tablet daily until  complete 01/30/24  Yes Shirlee Latch, PA-C  promethazine-dextromethorphan (PROMETHAZINE-DM) 6.25-15 MG/5ML syrup Take 5 mLs by mouth 4 (four) times daily as needed for cough. 01/30/24  Yes Shirlee Latch, PA-C  VENTOLIN HFA 108 (90 Base) MCG/ACT inhaler Inhale 2 puffs into the lungs every 6 (six) hours as needed. 01/09/24  Yes [provider]  WEGOVY 0.5 MG/0.5ML SOAJ Inject 0.5 mg into the skin once a week.   Yes [provider]  atorvastatin (LIPITOR) 80 MG tablet Take by mouth. 07/26/21 05/14/23  [provider]  clindamycin (CLEOCIN T) 1 % lotion Apply topically 2 (two) times daily. 09/30/23 09/29/24  [provider]  clobetasol ointment (TEMOVATE) 0.05 % Apply 1 Application topically 2 (two) times daily. 09/30/23 09/29/24  [provider]  cyanocobalamin 100 MCG tablet Take 100 mcg by mouth daily.    [provider]  ketoconazole (NIZORAL) 2 % cream Apply 1 Application topically daily. 07/15/23   [provider]  ketoconazole (NIZORAL) 2 % shampoo Apply 1 Application topically 2 (two) times a week. 07/15/23   [provider]  naproxen (NAPROSYN) 500 MG tablet Take 1 tablet (500 mg total) by mouth 2 (two) times daily with a meal. 01/14/24   Katha Cabal, DO    Family History History reviewed. No pertinent family history.  Social History Social History   Tobacco Use   Smoking status: Former    Current packs/day: 0.00    Types: Cigarettes    Quit date: 05/12/2023    Years since quitting: 0.7   Smokeless tobacco: Never  Vaping Use   Vaping status: Former  Substance Use Topics   Alcohol use: Yes    Comment: rare   Drug use: Yes    Types: Marijuana     Allergies   Ciprofloxacin   Review of Systems Review of Systems  Constitutional:  Positive for fatigue. Negative for chills, diaphoresis and fever.  HENT:  Negative for congestion, ear pain, rhinorrhea, sinus pressure, sinus pain and sore throat.   Respiratory:   Positive for cough, chest tightness, shortness of breath and wheezing.   Gastrointestinal:  Negative for abdominal pain, nausea and vomiting.  Musculoskeletal:  Negative for arthralgias and myalgias.  Skin:  Negative for rash.  Neurological:  Negative for weakness and headaches.  Hematological:  Negative for adenopathy.     Physical Exam Triage Vital Signs ED Triage Vitals  Encounter Vitals Group     BP 01/30/24 1615 135/89     Systolic BP Percentile --      Diastolic BP Percentile --      Pulse  Rate 01/30/24 1615 76     Resp 01/30/24 1615 (!) 24     Temp 01/30/24 1615 98.4 F (36.9 C)     Temp Source 01/30/24 1615 Oral     SpO2 01/30/24 1615 93 %     Weight --      Height --      Head Circumference --      Peak Flow --      Pain Score 01/30/24 1613 0     Pain Loc --      Pain Education --      Exclude from Growth Chart --    No data found.  Updated Vital Signs BP 135/89 (BP Location: Left Wrist)   Pulse 76   Temp 98.4 F (36.9 C) (Oral)   Resp (!) 24   SpO2 93%      Physical Exam Vitals and nursing note reviewed.  Constitutional:      General: She is not in acute distress.    Appearance: Normal appearance. She is not ill-appearing or toxic-appearing.  HENT:     Head: Normocephalic and atraumatic.     Nose: Nose normal.     Mouth/Throat:     Mouth: Mucous membranes are moist.     Pharynx: Oropharynx is clear.  Eyes:     General: No scleral icterus.       Right eye: No discharge.        Left eye: No discharge.     Conjunctiva/sclera: Conjunctivae normal.  Cardiovascular:     Rate and Rhythm: Normal rate and regular rhythm.     Heart sounds: Normal heart sounds.  Pulmonary:     Effort: Respiratory distress (Increased RR. Able to speak in full sentences.) present.     Breath sounds: Wheezing present.  Musculoskeletal:     Cervical back: Neck supple.  Skin:    General: Skin is dry.  Neurological:     General: No focal deficit present.     Mental  Status: She is alert. Mental status is at baseline.     Motor: No weakness.     Gait: Gait normal.  Psychiatric:        Mood and Affect: Mood normal.        Behavior: Behavior normal.      UC Treatments / Results  Labs (all labs ordered are listed, but only abnormal results are displayed) Labs Reviewed - No data to display  EKG   Radiology DG Chest 2 View Result Date: 01/30/2024 CLINICAL DATA:  Shortness of breath and wheezing. EXAM: CHEST - 2 VIEW COMPARISON:  December 19, 2022. FINDINGS: The heart size and mediastinal contours are within normal limits. Both lungs are clear. The visualized skeletal structures are unremarkable. IMPRESSION: No active cardiopulmonary disease. Electronically Signed   By: Lupita Raider M.D.   On: 01/30/2024 17:19    Procedures Procedures (including critical care time)  Medications Ordered in UC Medications  ipratropium-albuterol (DUONEB) 0.5-2.5 (3) MG/3ML nebulizer solution 3 mL (3 mLs Nebulization Given 01/30/24 1640)  dexamethasone (DECADRON) injection 10 mg (10 mg Intramuscular Given 01/30/24 1640)    Initial Impression / Assessment and Plan / UC Course  I have reviewed the triage vital signs and the nursing notes.  Pertinent labs & imaging results that were available during my care of the patient were reviewed by me and considered in my medical decision making (see chart for details).   33 year old female presents for wheezing, shortness of breath and cough which  is gotten worse over the past 3 days.  She reports a 61-month history of intermittent wheezing and shortness of breath.  Has an appoint with pulmonology in 2 months for asthma/COPD testing.  Uses albuterol as needed at this time.  Patient is afebrile.  Overall well-appearing.  Increased respiratory rate but able to speak in full sentences.  Oxygen saturations 93%.  Normal HEENT exam.  Diffuse wheezing throughout all lung fields.  Patient actually has audible wheezing on exam.  Heart  regular rate and rhythm.  Chest x-ray obtained.  Patient given DuoNeb and 10 mg IM dexamethasone.  Patient reported improvement after DuoNeb treatment.  Chest x-ray over read without acute abnormality.  Discussed this with patient.  Flareup of chronic underlying lung disease/reactive airway disease.  Advised to start prednisone taper tomorrow.  Sent Promethazine DM to use as needed for cough, mostly at night.  Encouraged increasing rest and fluids.  Continue inhalers.  Advised return and ED precautions, otherwise follow-up with pulmonology as scheduled.   Final Clinical Impressions(s) / UC Diagnoses   Final diagnoses:  Wheezing  Reactive airway disease with acute exacerbation, unspecified asthma severity, unspecified whether persistent  Acute cough     Discharge Instructions      -I will contact you if the radiologist sees anything abnormal on the x-ray other than bronchitis or asthma. - You were given a breathing treatment, corticosteroid injection in the clinic.  Sent prednisone for you to start tomorrow.  I also sent cough medicine. - Keep your appointment with pulmonology in March. - Increase rest and fluids. - Return if fever or worsening symptoms.  Go to ER for acute worsening of symptoms.     ED Prescriptions     Medication Sig Dispense Auth. Provider   predniSONE (DELTASONE) 10 MG tablet Take 6 tabs p.o. on day 1 and decrease by 1 tablet daily until complete 21 tablet Shirlee Latch, PA-C   promethazine-dextromethorphan (PROMETHAZINE-DM) 6.25-15 MG/5ML syrup Take 5 mLs by mouth 4 (four) times daily as needed for cough. 118 mL Shirlee Latch, PA-C      PDMP not reviewed this encounter.   Shirlee Latch, PA-C 01/30/24 1729

## 2024-01-30 NOTE — ED Triage Notes (Signed)
SOB and wheezing for the past 3 days  Wheezing for 7 months. Has appointment with pulmonologist

## 2024-03-22 ENCOUNTER — Emergency Department
Admission: EM | Admit: 2024-03-22 | Discharge: 2024-03-22 | Disposition: A | Discharge status: Home / Self Care | Attending: Emergency Medicine | Admitting: Emergency Medicine

## 2024-03-22 ENCOUNTER — Other Ambulatory Visit: Payer: Self-pay

## 2024-03-22 DIAGNOSIS — G8929 Other chronic pain: Secondary | ICD-10-CM | POA: Diagnosis not present

## 2024-03-22 DIAGNOSIS — I129 Hypertensive chronic kidney disease with stage 1 through stage 4 chronic kidney disease, or unspecified chronic kidney disease: Secondary | ICD-10-CM | POA: Insufficient documentation

## 2024-03-22 DIAGNOSIS — N189 Chronic kidney disease, unspecified: Secondary | ICD-10-CM | POA: Insufficient documentation

## 2024-03-22 DIAGNOSIS — M549 Dorsalgia, unspecified: Secondary | ICD-10-CM | POA: Diagnosis present

## 2024-03-22 DIAGNOSIS — M5442 Lumbago with sciatica, left side: Secondary | ICD-10-CM | POA: Diagnosis not present

## 2024-03-22 MED ORDER — OXYCODONE-ACETAMINOPHEN 5-325 MG PO TABS
1.0000 | ORAL_TABLET | Freq: Once | ORAL | Status: AC
  Administered 2024-03-22: 1 via ORAL
  Filled 2024-03-22: qty 1

## 2024-03-22 MED ORDER — CYCLOBENZAPRINE HCL 10 MG PO TABS: 10.0000 mg | ORAL_TABLET | Freq: Three times a day (TID) | ORAL | 0 refills | Status: DC | PRN

## 2024-03-22 MED ORDER — PREDNISONE 50 MG PO TABS
ORAL_TABLET | ORAL | 0 refills | Status: DC
Start: 2024-03-22 — End: 2024-04-29

## 2024-03-22 MED ORDER — OXYCODONE-ACETAMINOPHEN 5-325 MG PO TABS: 1.0000 | ORAL_TABLET | Freq: Four times a day (QID) | ORAL | 0 refills | Status: AC | PRN

## 2024-03-22 MED ORDER — PREDNISONE 20 MG PO TABS
60.0000 mg | ORAL_TABLET | Freq: Once | ORAL | Status: AC
  Administered 2024-03-22: 60 mg via ORAL
  Filled 2024-03-22: qty 3

## 2024-03-22 NOTE — ED Triage Notes (Signed)
 Pt presents to the ED POV from home for acute on chronic left sided back pain that radiates down her left leg x1 week. Pt also reports tingling in same leg x1 week. Pt reports that she was seen at her PCP for same and thaty she has a CT scan ordered on Wednesday. She also has an appt with the pain clinic in May. Pt was prescribed muscle relaxer by PCP with no improvement. Pt A&Ox4 at time of triage. Ambulatory to triage room.

## 2024-03-22 NOTE — ED Provider Notes (Addendum)
 [facility] Provider Note    Event Date/Time   First MD Initiated Contact with Patient 03/22/24 1727     (approximate)   History   Back Pain   HPI  Karen Hunt is a 33 y.o. female with PMH of hypertension, seizures, stroke, CKD who presents for evaluation of acute on chronic left-sided back pain that radiates down her left leg.  Patient was seen by her PCP for this last week.  And has a CT scan scheduled for Wednesday this week.  Patient also has an appointment with the pain clinic in May.  She has been taking Robaxin that was prescribed by her PCP without relief.  Patient states that she has had tingling in her left leg and that her leg gave out today.  Pain is better in the morning and gets progressively worse throughout the day the longer she is on her feet.  She denies changes in bladder or bowel function, fevers and saddle anesthesia.      Physical Exam   Triage Vital Signs: ED Triage Vitals [03/22/24 1306]  Encounter Vitals Group     BP (!) 148/98     Systolic BP Percentile      Diastolic BP Percentile      Pulse Rate 81     Resp 18     Temp 98.6 F (37 C)     Temp Source Oral     SpO2 94 %     Weight (!) 322 lb (146.1 kg)     Height 5\' 9"  (1.753 m)     Head Circumference      Peak Flow      Pain Score 8     Pain Loc      Pain Education      Exclude from Growth Chart     Most recent vital signs: Vitals:   03/22/24 1306 03/22/24 1715  BP: (!) 148/98 139/75  Pulse: 81 76  Resp: 18 19  Temp: 98.6 F (37 C) 97.9 F (36.6 C)  SpO2: 94% 96%   General: Awake, very uncomfortable, pacing around the home. CV:  Good peripheral perfusion.  RRR. Resp:  Normal effort.  Wheezing in all lung fields bilaterally. Abd:  No distention.  Other:  Tender to palpation along the lumbar spine and paraspinal muscles.  Decreased sensation in the left lower extremity, 4/5 strength in left leg when compared with the right.  Dorsalis pedis pulse is 2+  and regular.  Unable to assess straight leg test as patient was seen in the hallway in a recliner.   ED Results / Procedures / Treatments   Labs (all labs ordered are listed, but only abnormal results are displayed) Labs Reviewed - No data to display  PROCEDURES:  Critical Care performed: No  Procedures   MEDICATIONS ORDERED IN ED: Medications  oxyCODONE-acetaminophen (PERCOCET/ROXICET) 5-325 MG per tablet 1 tablet (has no administration in time range)  predniSONE (DELTASONE) tablet 60 mg (has no administration in time range)     IMPRESSION / MDM / ASSESSMENT AND PLAN / ED COURSE  I reviewed the triage vital signs and the nursing notes.                             33 year old female presents for evaluation of acute on chronic left back pain with radiation down the left leg.  Vital signs are stable patient is very uncomfortable on exam.  Differential  diagnosis includes, but is not limited to, musculoskeletal strain, disc herniation, spinal stenosis, lumbar radiculopathy, osteoarthritis, cauda equina, osteomyelitis, epidural abscess.  Patient's presentation is most consistent with severe exacerbation of chronic illness.  Patient reports that her lungs are wheezing and she was recently diagnosed with asthma.  She has an albuterol inhaler and is waiting for insurance approval for daily maintenance therapy.  She does not feel short of breath at this time.  Patient's presentation is consistent with a lumbar radiculopathy.  No red flag signs for acute cord compression so low suspicion for cauda equina.  Do not feel that patient needs emergent MRI at this time.  Patient has a CT scan scheduled for Wednesday and has already been referred to the pain clinic.  No trauma so do not feel a lumbar x-ray would be high yield.  Will give her information for neurosurgery.  Will give patient pain medication while in the emergency department and sent her home on a steroids.  Patient states she is  unable to take ibuprofen due to her history of stroke.  Patient was given follow-up information for neurosurgery.  We discussed return precautions and when to return to the emergency department.  Patient voiced understanding, all questions were answered and she was stable at discharge.     FINAL CLINICAL IMPRESSION(S) / ED DIAGNOSES   Final diagnoses:  Chronic left-sided low back pain with left-sided sciatica     Rx / DC Orders   ED Discharge Orders          Ordered    predniSONE (DELTASONE) 50 MG tablet        03/22/24 1751    oxyCODONE-acetaminophen (PERCOCET) 5-325 MG tablet  Every 6 hours PRN        03/22/24 1751    cyclobenzaprine (FLEXERIL) 10 MG tablet  3 times daily PRN        03/22/24 1753             Note:  This document was prepared using Dragon voice recognition software and may include unintentional dictation errors.   Cameron Ali, PA-C 03/22/24 1800    Jene Every, MD 03/22/24 1827    Cameron Ali, PA-C 03/22/24 1842    Jene Every, MD 03/22/24 1910

## 2024-03-22 NOTE — Discharge Instructions (Signed)
 You can take the Flexeril 3 times daily as needed for muscle spasms.  This is a muscle relaxer.  Do not take it with the Robaxin.  You can take 1 or the other.  These medications are sedating so do not drive after taking them.  I have sent Percocet that can be taken every 6 hours as needed for severe pain.  Do not drive after taking this medication.  Please take the prednisone once a day for the next 4 days.  This is a steroid and works as an anti-inflammatory.

## 2024-03-22 NOTE — ED Notes (Signed)
 See triage note  Presents with lower back pain  States pain is chronic  Radiates into left leg  Limps slightly

## 2024-03-24 NOTE — Progress Notes (Unsigned)
 Referring Physician:  Physicians, [facility] [address] [place],  [place] [address]  Primary Physician:  Physicians, [facility]  History of Present Illness: 03/26/2024 [name] is here today with a chief complaint of acute on chronic left-sided back pain rating down her left leg x 2 weeks.  She is unsure what provoked this flare but states that she often has to help move and lift her grandfather.  She states she has severe pain rating down her left leg with associated numbness and tingling.  It is worse with lying down and standing.  She was given prednisone, oxycodone, Flexeril.  She is unsure which of these medications is working as she began them at the same time, but she does feel as though they are giving her at least some relief to make it bearable to sleep.  She adds that her legs feel weak and they keep giving out on her she states she has not fallen yet, but she will be walking and all of a sudden her left leg will feel like Jell-O.  She denies any saddle anesthesia or incontinence to bowel or bladder.   Conservative measures:  Physical therapy:  has not participated in PT Multimodal medical therapy including regular antiinflammatories:  Prednisone, Naproxen, Oxycodone, Flexeril Injections: no epidural steroid injections  Past Surgery: none  Karen Hunt has no symptoms of cervical myelopathy.  The symptoms are causing a significant impact on the patient's life.   Review of Systems:  A 10 point review of systems is negative, except for the pertinent positives and negatives detailed in the HPI.  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  For about 2 months w/ on/off pain  associated. Check U/A, A1c, and BMP.   Migraines    Seizures (HCC)    Stroke [facility])    had 3 mini strokes   TIA (transient ischemic attack)     Past Surgical History: Past Surgical History:  Procedure Laterality Date   c section     FOOT SURGERY Left 2010   KIDNEY SURGERY  04/1991   MYRINGOTOMY  1996   MYRINGOTOMY WITH TUBE PLACEMENT Bilateral 05/14/2023   Procedure: MYRINGOTOMY WITH TUBE PLACEMENT;  Surgeon: Geanie Logan, MD;  Location: [facility] ORS;  Service: ENT;  Laterality: Bilateral;   WISDOM TOOTH EXTRACTION     at the age of 71    Allergies: Allergies as of 03/26/2024 - Review Complete 03/26/2024  Allergen Reaction Noted   Ciprofloxacin Nausea And Vomiting 08/29/2015    Medications: Outpatient Encounter Medications as of 03/26/2024  Medication Sig   acetaminophen (TYLENOL) 500 MG tablet Take by mouth.   AIMOVIG 140 MG/ML SOAJ SMARTSIG:1 Milliliter(s) SUB-Q Every 4 Weeks   ALPRAZolam (XANAX) 1 MG tablet Take 1-2 tablets (1-2 mg total) by mouth once for 1 dose. Take one tablet approximately one hour before the MRI. If still feeling anxious then take one prior to beginning the imaging.   clindamycin (CLEOCIN T) 1 % lotion Apply topically 2 (two) times daily.   clobetasol ointment (TEMOVATE) 0.05 % Apply 1 Application topically 2 (two) times daily.   cyanocobalamin 100 MCG tablet Take 100 mcg by  mouth daily.   cyclobenzaprine (FLEXERIL) 10 MG tablet Take 1 tablet (10 mg total) by mouth 3 (three) times daily as needed.   escitalopram (LEXAPRO) 10 MG tablet Take by mouth.   famotidine (PEPCID) 20 MG tablet Take 1 tablet by mouth 2 (two) times daily.   hydrOXYzine (ATARAX) 25 MG tablet Take 25 mg by mouth every 8 (eight) hours as needed for anxiety.   ketoconazole (NIZORAL) 2 % cream Apply 1 Application topically daily.   ketoconazole (NIZORAL) 2 % shampoo Apply 1 Application topically 2 (two) times a week.   levonorgestrel (MIRENA) 20 MCG/24HR IUD 1 each by Intrauterine route  once.   magnesium 30 MG tablet Take 30 mg by mouth 2 (two) times daily.   metFORMIN (GLUCOPHAGE) 500 MG tablet Take 500 mg by mouth daily with breakfast.   metroNIDAZOLE (METROGEL) 0.75 % gel Apply twice daily to rash on face   naproxen (NAPROSYN) 500 MG tablet Take 1 tablet (500 mg total) by mouth 2 (two) times daily with a meal.   NURTEC 75 MG TBDP Take by mouth.   oxyCODONE-acetaminophen (PERCOCET) 5-325 MG tablet Take 1 tablet by mouth every 6 (six) hours as needed for up to 5 days for severe pain (pain score 7-10).   predniSONE (DELTASONE) 50 MG tablet Take one tablet per day for 4 days.   promethazine (PHENERGAN) 12.5 MG tablet Take with Nurtec for migraine   VENTOLIN HFA 108 (90 Base) MCG/ACT inhaler Inhale 2 puffs into the lungs every 6 (six) hours as needed.   atorvastatin (LIPITOR) 80 MG tablet Take by mouth.   [DISCONTINUED] promethazine-dextromethorphan (PROMETHAZINE-DM) 6.25-15 MG/5ML syrup Take 5 mLs by mouth 4 (four) times daily as needed for cough.   [DISCONTINUED] WEGOVY 0.5 MG/0.5ML SOAJ Inject 0.5 mg into the skin once a week.   No facility-administered encounter medications on file as of 03/26/2024.    Social History: Social History   Tobacco Use   Smoking status: Former    Current packs/day: 0.00    Types: Cigarettes    Quit date: 05/12/2023    Years since quitting: 0.8   Smokeless tobacco: Never  Vaping Use   Vaping status: Former  Substance Use Topics   Alcohol use: Yes    Comment: rare   Drug use: Yes    Types: Marijuana    Family Medical History: History reviewed. No pertinent family history.  Physical Examination: @VITALWITHPAIN @  General: Patient is well developed, well nourished, calm, collected, and in no apparent distress. Attention to examination is appropriate.  Psychiatric: Patient is non-anxious.  Head:  Pupils equal, round, and reactive to light.  ENT:  Oral mucosa appears well hydrated.  Neck:   Supple.  Full range of  motion.  Respiratory: Patient is breathing without any difficulty.  Extremities: No edema.  Vascular: Palpable dorsal pedal pulses.  Skin:   On exposed skin, there are no abnormal skin lesions.  NEUROLOGICAL:     Awake, alert, oriented to person, place, and time.  Speech is clear and fluent. Fund of knowledge is appropriate.   Cranial Nerves: Pupils equal round and reactive to light.  Facial tone is symmetric.   Nontender palpation of her lumbar spine. +SLR, left lower extremity  Strength:  Side Iliopsoas Quads Hamstring PF DF EHL  R 5 5 5 5 5 5   L 4+ 5 5 4- 5 4   Reflexes are 2+ and symmetric at the patella and achilles.   Clonus is not present.  Toes are down-going.  Bilateral upper and lower extremity sensation is intact to light touch.    Patient having difficulty with gait secondary to pain.  Medical Decision Making  Imaging:  FINDINGS:  Images are severely degraded by high noise.   There is no fracture. The vertebrae are normally aligned. The osseous neural foramina and spinal canal are patent. Multilevel Schmorl's nodes involving the endplates of T10-L4. No paravertebral soft tissue abnormality.    IMPRESSION:  Images are severely degraded by high noise.   I have personally reviewed the images and agree with the above interpretation.  Assessment and Plan: Ms. Heidel is a pleasant 33 y.o. female is here today with a chief complaint of acute on chronic left-sided back pain rating down her left leg x 2 weeks.  She is unsure what provoked this flare but states that she often has to help move and lift her grandfather.  She states she has severe pain rating down her left leg with associated numbness and tingling.  It is worse with lying down and standing.  She was given prednisone, oxycodone, Flexeril.  She is unsure which of these medications is working as she began them at the same time, but she does feel as though they are giving her at least some relief to make it  bearable to sleep.  She adds that her legs feel weak and they keep giving out on her she states she has not fallen yet, but she will be walking and all of a sudden her left leg will feel like Jell-O.  She denies any saddle anesthesia or incontinence to bowel or bladder.  On examination patient has some tenderness over lumbar paraspinals.  Positive straight leg raise on the left lower extremity.  She also has some weakness present in her left lower extremity.  CT scan most recently completed was reviewed however these were severely degraded on visualization due to artifact.  It was a pleasure to see patient in clinic today.  Plan for updated x-rays today as well as an MRI for evaluation of lumbar spine in setting of continued pain and weakness.  Physical therapy referral was also placed.  Will review imaging once complete.  Plan to see back in approximately 6 weeks.  She was asked to contact us for any questions or concerns that she has in the meantime.  Thank you for involving me in the care of this patient.   I spent a total of 45 minutes in both face-to-face and non-face-to-face activities for this visit on the date of this encounter including pregnancy with the patient, review of previous test, obtaining separately obtained history, performing medically appropriate examination, counseling the patient, ordering tests, documenting, and care coronation.  Joan Flores, PA-C Dept. of Neurosurgery

## 2024-03-26 ENCOUNTER — Ambulatory Visit
Admission: RE | Admit: 2024-03-26 | Discharge: 2024-03-26 | Disposition: A | Discharge status: Home / Self Care | Source: Ambulatory Visit | Attending: Physician Assistant | Admitting: Physician Assistant

## 2024-03-26 ENCOUNTER — Encounter: Payer: Self-pay | Admitting: Physician Assistant

## 2024-03-26 ENCOUNTER — Ambulatory Visit (INDEPENDENT_AMBULATORY_CARE_PROVIDER_SITE_OTHER): Admitting: Physician Assistant

## 2024-03-26 ENCOUNTER — Ambulatory Visit
Admission: RE | Admit: 2024-03-26 | Discharge: 2024-03-26 | Disposition: A | Discharge status: Home / Self Care | Attending: Physician Assistant | Admitting: Physician Assistant

## 2024-03-26 VITALS — BP 118/82 | Ht 69.0 in | Wt 343.0 lb

## 2024-03-26 DIAGNOSIS — M5416 Radiculopathy, lumbar region: Secondary | ICD-10-CM | POA: Insufficient documentation

## 2024-03-26 MED ORDER — ALPRAZOLAM 1 MG PO TABS: 1.0000 mg | ORAL_TABLET | Freq: Once | ORAL | 0 refills | Status: AC

## 2024-03-30 ENCOUNTER — Ambulatory Visit
Admission: RE | Admit: 2024-03-30 | Discharge: 2024-03-30 | Disposition: A | Discharge status: Home / Self Care | Source: Ambulatory Visit | Attending: Emergency Medicine | Admitting: Emergency Medicine

## 2024-03-30 ENCOUNTER — Ambulatory Visit
Admit: 2024-03-30 | Discharge: 2024-03-30 | Disposition: A | Discharge status: Home / Self Care | Attending: Emergency Medicine | Admitting: Emergency Medicine

## 2024-03-30 ENCOUNTER — Encounter: Payer: Self-pay | Admitting: Emergency Medicine

## 2024-03-30 VITALS — BP 129/92 | HR 88 | Temp 98.5°F | Resp 16

## 2024-03-30 DIAGNOSIS — M7989 Other specified soft tissue disorders: Secondary | ICD-10-CM | POA: Diagnosis not present

## 2024-03-30 DIAGNOSIS — M79662 Pain in left lower leg: Secondary | ICD-10-CM | POA: Diagnosis present

## 2024-03-30 LAB — COMPREHENSIVE METABOLIC PANEL WITH GFR
ALT: 18 U/L (ref 0–44)
AST: 16 U/L (ref 15–41)
Albumin: 3.6 g/dL (ref 3.5–5.0)
Alkaline Phosphatase: 35 U/L — ABNORMAL LOW (ref 38–126)
Anion gap: 8 (ref 5–15)
BUN: 17 mg/dL (ref 6–20)
CO2: 25 mmol/L (ref 22–32)
Calcium: 8.5 mg/dL — ABNORMAL LOW (ref 8.9–10.3)
Chloride: 102 mmol/L (ref 98–111)
Creatinine, Ser: 0.81 mg/dL (ref 0.44–1.00)
GFR, Estimated: >60 mL/min (ref >60)
Glucose, Bld: 92 mg/dL (ref 70–99)
Potassium: 4 mmol/L (ref 3.5–5.1)
Sodium: 135 mmol/L (ref 135–145)
Total Bilirubin: 1.2 mg/dL (ref 0.0–1.2)
Total Protein: 6.3 g/dL — ABNORMAL LOW (ref 6.5–8.1)

## 2024-03-30 NOTE — Discharge Instructions (Signed)
 You have an ultrasound today at 130.  Would come 15 minutes early.  I will contact you if and only if your labs are abnormal or you have a blood clot and we need to start you on anticoagulants.  Tylenol 1000 mg 3-4 times a day for pain, heat or ice, whichever feels better.  Go to the ER if your pain gets worse, you have chest pain, worsening shortness of breath, or for any other concerns.

## 2024-03-30 NOTE — ED Provider Notes (Signed)
 HPI  SUBJECTIVE:  Karen Hunt is a 33 y.o. female who presents with left lower extremity edema for the past 3 days and crampy "charley horse" posterior left calf pain for the past week.  States that the swelling went down slightly this morning.  It has not otherwise changed in size.  No change in her physical activity or trauma to the calf.  She initially attributed the pain to her left low back pain with sciatica.  No surgery in the past 4 weeks, recent immobilization, hemoptysis, chest pain, change in her baseline shortness of breath, exogenous estrogen.  She is a former smoker.  She tried ice pack, elevation and increasing fluids without improvement in her symptoms.  Symptoms are worse with walking.  She has never had symptoms like this before.  She is concerned about a possible DVT. She has a past medical history of single kidney-states that it works well, hypertension, seizures, TIA x 3, acute on chronic left back pain with radiculopathy, asthma.  No history of cancer, DVT, PE, hypercoagulability, liver disease.  LMP: Amenorrheic secondary to Mirena IUD.  Denies possibility of being pregnant.  PCP: [facility].  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  For about 2 months w/ on/off pain associated. Check U/A, A1c, and BMP.   Migraines    Seizures (HCC)    Stroke [facility])    had 3 mini strokes   TIA (transient ischemic attack)     Past Surgical History:  Procedure Laterality Date   c section     FOOT SURGERY Left 2010   KIDNEY SURGERY  04/1991   MYRINGOTOMY  1996   MYRINGOTOMY WITH TUBE PLACEMENT Bilateral 05/14/2023   Procedure: MYRINGOTOMY WITH TUBE PLACEMENT;  Surgeon: Geanie Logan, MD;  Location: [facility] ORS;  Service:  ENT;  Laterality: Bilateral;   WISDOM TOOTH EXTRACTION     at the age of 28    History reviewed. No pertinent family history.  Social History   Tobacco Use   Smoking status: Former    Current packs/day: 0.00    Types: Cigarettes    Quit date: 05/12/2023    Years since quitting: 0.8   Smokeless tobacco: Never  Vaping Use   Vaping status: Former  Substance Use Topics   Alcohol use: Yes    Comment: rare   Drug use: Yes    Types: Marijuana    No current facility-administered medications for this encounter.  Current Outpatient Medications:    acetaminophen (TYLENOL) 500 MG tablet, Take by mouth., Disp: , Rfl:    AIMOVIG 140 MG/ML SOAJ, SMARTSIG:1 Milliliter(s) SUB-Q Every 4 Weeks, Disp: , Rfl:    atorvastatin (LIPITOR) 80 MG tablet, Take by mouth., Disp: , Rfl:    clindamycin (CLEOCIN T) 1 % lotion, Apply topically 2 (two) times daily., Disp: , Rfl:    clobetasol ointment (TEMOVATE) 0.05 %, Apply 1 Application topically 2 (two) times daily., Disp: , Rfl:    cyanocobalamin 100 MCG tablet, Take 100 mcg by mouth daily., Disp: , Rfl:    cyclobenzaprine (FLEXERIL) 10 MG tablet, Take 1 tablet (10 mg total) by mouth 3 (three) times daily as needed., Disp: 30 tablet, Rfl:  0   escitalopram (LEXAPRO) 10 MG tablet, Take by mouth., Disp: , Rfl:    famotidine (PEPCID) 20 MG tablet, Take 1 tablet by mouth 2 (two) times daily., Disp: , Rfl:    hydrOXYzine (ATARAX) 25 MG tablet, Take 25 mg by mouth every 8 (eight) hours as needed for anxiety., Disp: , Rfl:    ketoconazole (NIZORAL) 2 % cream, Apply 1 Application topically daily., Disp: , Rfl:    ketoconazole (NIZORAL) 2 % shampoo, Apply 1 Application topically 2 (two) times a week., Disp: , Rfl:    levonorgestrel (MIRENA) 20 MCG/24HR IUD, 1 each by Intrauterine route once., Disp: , Rfl:    magnesium 30 MG tablet, Take 30 mg by mouth 2 (two) times daily., Disp: , Rfl:    metFORMIN (GLUCOPHAGE) 500 MG tablet, Take 500 mg by mouth daily with  breakfast., Disp: , Rfl:    metroNIDAZOLE (METROGEL) 0.75 % gel, Apply twice daily to rash on face, Disp: , Rfl:    naproxen (NAPROSYN) 500 MG tablet, Take 1 tablet (500 mg total) by mouth 2 (two) times daily with a meal., Disp: 30 tablet, Rfl: 0   NURTEC 75 MG TBDP, Take by mouth., Disp: , Rfl:    promethazine (PHENERGAN) 12.5 MG tablet, Take with Nurtec for migraine, Disp: , Rfl:    ALPRAZolam (XANAX) 1 MG tablet, Take by mouth., Disp: , Rfl:    predniSONE (DELTASONE) 50 MG tablet, Take one tablet per day for 4 days., Disp: 4 tablet, Rfl: 0   VENTOLIN HFA 108 (90 Base) MCG/ACT inhaler, Inhale 2 puffs into the lungs every 6 (six) hours as needed., Disp: , Rfl:   Allergies  Allergen Reactions   Ciprofloxacin Nausea And Vomiting     ROS  As noted in HPI.   Physical Exam  BP (!) 129/92 (BP Location: Left Arm)   Pulse 88   Temp 98.5 F (36.9 C) (Oral)   Resp 16   SpO2 97%   Constitutional: Well developed, well nourished, no acute distress Eyes:  EOMI, conjunctiva normal bilaterally HENT: Normocephalic, atraumatic,mucus membranes moist Respiratory: Normal inspiratory effort Cardiovascular: Normal rate GI: nondistended skin: No rash, skin intact Musculoskeletal: Left calf 56.5 cm.  Right calf 55 cm.  Trace edema bilaterally.  Positive tenderness posterior calf in the midline and along the gastrocnemius bilaterally.  No palpable cord.  Positive Homans' sign.  Positive tenderness medial left thigh. Neurologic: Alert & oriented x 3, no focal neuro deficits Psychiatric: Speech and behavior appropriate   ED Course   Medications - No data to display  Orders Placed This Encounter  Procedures   US Venous Img Lower Unilateral Left    Standing Status:   Standing    Number of Occurrences:   1    Reason for Exam (SYMPTOM  OR DIAGNOSIS REQUIRED):   left posterior calf tenderness + homan's r/o DVT   Comprehensive metabolic panel    Standing Status:   Standing    Number of  Occurrences:   1    Results for orders placed or performed during the hospital encounter of 03/30/24 (from the past 24 hours)  Comprehensive metabolic panel     Status: Abnormal   Collection Time: 03/30/24 11:48 AM  Result Value Ref Range   Sodium 135 135 - 145 mmol/L   Potassium 4.0 3.5 - 5.1 mmol/L   Chloride 102 98 - 111 mmol/L   CO2 25 22 - 32 mmol/L   Glucose, Bld 92 70 - 99 mg/dL  BUN 17 6 - 20 mg/dL   Creatinine, Ser 1.91 0.44 - 1.00 mg/dL   Calcium 8.5 (L) 8.9 - 10.3 mg/dL   Total Protein 6.3 (L) 6.5 - 8.1 g/dL   Albumin 3.6 3.5 - 5.0 g/dL   AST 16 15 - 41 U/L   ALT 18 0 - 44 U/L   Alkaline Phosphatase 35 (L) 38 - 126 U/L   Total Bilirubin 1.2 0.0 - 1.2 mg/dL   GFR, Estimated >47 >82 mL/min   Anion gap 8 5 - 15   US Venous Img Lower Unilateral Left Result Date: 03/30/2024 CLINICAL DATA:  Left calf pain, tenderness and edema. EXAM: LEFT LOWER EXTREMITY VENOUS DOPPLER ULTRASOUND TECHNIQUE: Gray-scale sonography with graded compression, as well as color Doppler and duplex ultrasound were performed to evaluate the lower extremity deep venous systems from the level of the common femoral vein and including the common femoral, femoral, profunda femoral, popliteal and calf veins including the posterior tibial, peroneal and gastrocnemius veins when visible. The superficial great saphenous vein was also interrogated. Spectral Doppler was utilized to evaluate flow at rest and with distal augmentation maneuvers in the common femoral, femoral and popliteal veins. COMPARISON:  None Available. FINDINGS: Contralateral Common Femoral Vein: Respiratory phasicity is normal and symmetric with the symptomatic side. No evidence of thrombus. Normal compressibility. Common Femoral Vein: No evidence of thrombus. Normal compressibility, respiratory phasicity and response to augmentation. Saphenofemoral Junction: No evidence of thrombus. Normal compressibility and flow on color Doppler imaging. Profunda  Femoral Vein: No evidence of thrombus. Normal compressibility and flow on color Doppler imaging. Femoral Vein: No evidence of thrombus. Normal compressibility, respiratory phasicity and response to augmentation. Popliteal Vein: No evidence of thrombus. Normal compressibility, respiratory phasicity and response to augmentation. Calf Veins: No evidence of thrombus. Normal compressibility and flow on color Doppler imaging. Superficial Great Saphenous Vein: No evidence of thrombus. Normal compressibility. Venous Reflux:  None. Other Findings: No evidence of superficial thrombophlebitis or abnormal fluid collection. IMPRESSION: No evidence of left lower extremity deep venous thrombosis. Electronically Signed   By: Irish Lack M.D.   On: 03/30/2024 14:12    ED Clinical Impression  1. Pain of left calf   2. Left leg swelling      ED Assessment/Plan     Patient's primary concern is for DVT.  She denies change in activity or other reason for her to have musculoskeletal pain..  We do not have D-dimer testing available here, however, we do have ultrasound.  Will ultrasound to rule out DVT and check a CMP in case we need to initiate anticoagulation.  Ultrasound for DVT scheduled today at 1330.  Will discharge patient for now.  Will contact her at [phone] if and only if DVT is positive and we need to start anticoagulation.  Tylenol 1000 mg 3-4 times a day.  Heat or ice, whichever feels better.  She is to follow-up with her PCP as needed.  ER return precautions given.  CMP normal kidney and liver function.  Reviewed radiology report.  No DVT.  See radiology report for full details.  Patient has no evidence of DVT, and an unremarkable CMP.  Sent patient MyChart note informing her of results.  Continue plan as above.  Follow-up with PCP as needed.  Discussed labs, imaging, MDM, treatment plan, and plan for follow-up with patient. Discussed sn/sx that should prompt return to the ED. patient agrees  with plan.   No orders of the defined types were placed in this  encounter.     *This clinic note was created using Dragon dictation software. Therefore, there may be occasional mistakes despite careful proofreading.  ?    Domenick Gong, MD 03/30/24 1418

## 2024-03-30 NOTE — ED Triage Notes (Signed)
 Recently diagnosed with sciatica pain. Left leg has swollen the last few days. Just want to be sure it's not a blood clot. - Entered by patient

## 2024-04-03 ENCOUNTER — Ambulatory Visit
Admission: RE | Admit: 2024-04-03 | Discharge: 2024-04-03 | Disposition: A | Discharge status: Home / Self Care | Source: Ambulatory Visit | Attending: Physician Assistant | Admitting: Physician Assistant

## 2024-04-03 DIAGNOSIS — M5416 Radiculopathy, lumbar region: Secondary | ICD-10-CM | POA: Insufficient documentation

## 2024-04-07 ENCOUNTER — Ambulatory Visit: Admitting: Physician Assistant

## 2024-04-14 NOTE — Progress Notes (Signed)
 Referring Physician:  Physicians, [facility] [address] [place],  [place] [address]  Primary Physician:  Physicians, [facility]  History of Present Illness: 04/19/2024 Karen Hunt is here today with a chief complaint of left-sided lumbosacral radiculopathy.  She was seen by Ludwig Safer on 03/26/2024 prior to having lumbar imaging.  Given her weakness numbness tingling and signs and symptoms consistent with a lumbosacral radiculopathy she was sent for MRI.  MRI showed a disc herniation at L5-S1.  Patient states that she continues to have severe pain in her left buttocks going down the back of her leg to the bottom of her foot.  She noticed progressive weakness.  She is having difficulty getting up stairs that she cannot push down with her foot.  She feels like this is progressively getting worse.  She has had some near falls.  She does not have any bowel or bladder issues, but does feel like her weakness is continuing to progress.  She has gotten oral steroids which helped temporarily but did not help with her weakness.   Conservative measures:  Physical therapy:  has not participated in PT Multimodal medical therapy including regular antiinflammatories:  Prednisone , Naproxen , Oxycodone , Flexeril  Injections: no epidural steroid injections   Past Surgery: none   Karen Hunt has no symptoms of cervical myelopathy.   The symptoms are causing a significant impact on the patient's life  I have utilized the care everywhere function in epic to review the outside records available from external health systems.  Review of Systems:  A 10 point review of systems is negative, except for the pertinent positives and negatives detailed in the HPI.  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  For about 2 months w/ on/off pain associated. Check U/A, A1c, and BMP.   Migraines    Seizures (HCC)    Stroke [facility])    had 3 mini strokes   TIA (transient ischemic attack)     Past Surgical History: Past Surgical History:  Procedure Laterality Date   c section     FOOT SURGERY Left 2010   KIDNEY SURGERY  04/1991   MYRINGOTOMY  1996   MYRINGOTOMY WITH TUBE PLACEMENT Bilateral 05/14/2023   Procedure: MYRINGOTOMY WITH TUBE PLACEMENT;  Surgeon: Von Grumbling, MD;  Location: [facility] ORS;  Service: ENT;  Laterality: Bilateral;   WISDOM TOOTH EXTRACTION     at the age of 89    Allergies: Allergies as of 04/19/2024 - Review Complete 04/19/2024  Allergen Reaction Noted   Ciprofloxacin  Nausea And Vomiting 08/29/2015    Medications:  Current Outpatient Medications:    acetaminophen  (TYLENOL ) 500 MG tablet, Take by mouth., Disp: , Rfl:    AIMOVIG 140 MG/ML SOAJ, SMARTSIG:1 Milliliter(s) SUB-Q Every 4 Weeks, Disp: , Rfl:    clindamycin (CLEOCIN T) 1 % lotion, Apply topically 2 (two) times daily., Disp: , Rfl:    clobetasol ointment (TEMOVATE) 0.05 %, Apply 1 Application topically 2 (two) times daily., Disp: , Rfl:    cyanocobalamin 100 MCG tablet, Take 100 mcg by mouth daily., Disp: , Rfl:    escitalopram (LEXAPRO) 10 MG tablet, Take by mouth., Disp: ,  Rfl:    famotidine  (PEPCID ) 20 MG tablet, Take 1 tablet by mouth 2 (two) times daily., Disp: , Rfl:    hydrOXYzine (ATARAX) 25 MG tablet, Take 25 mg by mouth every 8 (eight) hours as needed for anxiety., Disp: , Rfl:    ketoconazole (NIZORAL) 2 % cream, Apply 1 Application topically daily., Disp: , Rfl:    ketoconazole (NIZORAL) 2 % shampoo, Apply 1 Application topically 2 (two) times a week., Disp: , Rfl:    levonorgestrel (MIRENA) 20 MCG/24HR IUD, 1 each by Intrauterine route once., Disp: , Rfl:    magnesium 30 MG tablet, Take 30 mg by  mouth 2 (two) times daily., Disp: , Rfl:    metFORMIN (GLUCOPHAGE) 500 MG tablet, Take 500 mg by mouth daily with breakfast., Disp: , Rfl:    metroNIDAZOLE  (METROGEL ) 0.75 % gel, Apply twice daily to rash on face, Disp: , Rfl:    naproxen  (NAPROSYN ) 500 MG tablet, Take 1 tablet (500 mg total) by mouth 2 (two) times daily with a meal., Disp: 30 tablet, Rfl: 0   NURTEC 75 MG TBDP, Take by mouth., Disp: , Rfl:    ondansetron  (ZOFRAN -ODT) 4 MG disintegrating tablet, , Disp: , Rfl:    predniSONE  (DELTASONE ) 50 MG tablet, Take one tablet per day for 4 days., Disp: 4 tablet, Rfl: 0   SYMBICORT 160-4.5 MCG/ACT inhaler, , Disp: , Rfl:    VENTOLIN  HFA 108 (90 Base) MCG/ACT inhaler, Inhale 2 puffs into the lungs every 6 (six) hours as needed., Disp: , Rfl:    atorvastatin (LIPITOR) 80 MG tablet, Take by mouth., Disp: , Rfl:   Social History: Social History   Tobacco Use   Smoking status: Former    Current packs/day: 0.00    Types: Cigarettes    Quit date: 05/12/2023    Years since quitting: 0.9   Smokeless tobacco: Never  Vaping Use   Vaping status: Former  Substance Use Topics   Alcohol use: Yes    Comment: rare   Drug use: Yes    Types: Marijuana    Family Medical History: History reviewed. No pertinent family history.  Physical Examination: Vitals:   04/19/24 1304  BP: (!) 142/90    General: Patient is in no apparent distress. Attention to examination is appropriate.  Neck:   Supple.  Full range of motion.  Respiratory: Patient is breathing without any difficulty.   NEUROLOGICAL:     Awake, alert, oriented to person, place, and time.  Speech is clear and fluent.   Cranial Nerves: Pupils equal round and reactive to light.  Facial tone is symmetric.  Facial sensation is symmetric. Shoulder shrug is symmetric. Tongue protrusion is midline.    Strength today:  Side Iliopsoas Quads Hamstring PF DF EHL  R 5 5 5 5 5 5   L 5 5 4  2-3 5 4    Strength taken from [facility] Frisbie's  note on 03/26/2024:   Side Iliopsoas Quads Hamstring PF DF EHL  R 5 5 5 5 5 5   L 4+ 5 5 4- 5 4   Decreased reflexes at the left Achilles, 2+ at the left patella  Straight leg raise is positive  Imaging: Narrative & Impression  CLINICAL DATA:  Sciatic pain with tingling in the left leg. Associated weakness. Symptoms worsening recently.   EXAM: MRI LUMBAR SPINE WITHOUT CONTRAST   TECHNIQUE: Multiplanar, multisequence MR imaging of the lumbar spine was performed. No intravenous contrast was administered.   COMPARISON:  Radiography 03/26/2024.  MRI 05/22/2008.   FINDINGS: Segmentation:  5 lumbar type vertebral bodies.   Alignment:  Normal alignment.   Vertebrae: Chronic superior endplate deformity at L1. No other focal bone finding.   Conus medullaris and cauda equina: Conus extends to the L1 level. Conus and cauda equina appear normal.   Paraspinal and other soft tissues: Negative   Disc levels:   No abnormality through the L3-4 level.   L4-5: No disc abnormality. Facet osteoarthritis worse on the left than the right. No neural compression.   L5-S1: Central to left posterolateral disc herniation without upward or downward migration. Stenosis of the left lateral recess with likely compression of the left S1 nerve.   IMPRESSION: 1. L5-S1: Central to left posterolateral disc herniation without upward or downward migration. Stenosis of the left lateral recess with likely compression of the left S1 nerve. 2. L4-5: Facet osteoarthritis worse on the left than the right. No neural compression.     Electronically Signed   By: Bettylou Brunner M.D.   On: 04/10/2024 11:41   I have personally reviewed the images and agree with the above interpretation.  Medical Decision Making/Assessment and Plan: Ms. Mikus is a pleasant 33 y.o. female with a severe left-sided lumbosacral radiculopathy, this is causing left-sided lower extremity weakness especially in plantarflexion.   Approximately a month ago she was 4-4 minus, today she is 2-3 out of 5.  She appears to have continued worsening weakness pain numbness and tingling in her left lower extremity secondary to a herniation at L5-S1 causing compression of her S1 nerve root.  She has failed oral therapy.  She continues to worsen and has increased pain and weakness.  Given these findings we did recommend a L5-S1 discectomy on the left, we did discuss that we may have to convert to open for full access, however we would attempt this with a tubular discectomy.  We also d discussed other risks including CSF leak, nerve injury, need for further surgery, failure to fully improve her symptoms.  Thank you for involving me in the care of this patient.    Carroll Clamp MD/MSCR Neurosurgery

## 2024-04-14 NOTE — H&P (View-Only) (Signed)
 Referring Physician:  Physicians, [facility] [address] [place],  [place] [address]  Primary Physician:  Physicians, [facility]  History of Present Illness: 04/19/2024 [name] is here today with a chief complaint of left-sided lumbosacral radiculopathy.  She was seen by Ludwig Safer on 03/26/2024 prior to having lumbar imaging.  Given her weakness numbness tingling and signs and symptoms consistent with a lumbosacral radiculopathy she was sent for MRI.  MRI showed a disc herniation at L5-S1.  Patient states that she continues to have severe pain in her left buttocks going down the back of her leg to the bottom of her foot.  She noticed progressive weakness.  She is having difficulty getting up stairs that she cannot push down with her foot.  She feels like this is progressively getting worse.  She has had some near falls.  She does not have any bowel or bladder issues, but does feel like her weakness is continuing to progress.  She has gotten oral steroids which helped temporarily but did not help with her weakness.   Conservative measures:  Physical therapy:  has not participated in PT Multimodal medical therapy including regular antiinflammatories:  Prednisone , Naproxen , Oxycodone , Flexeril  Injections: no epidural steroid injections   Past Surgery: none   Karen Hunt has no symptoms of cervical myelopathy.   The symptoms are causing a significant impact on the patient's life  I have utilized the care everywhere function in epic to review the outside records available from external health systems.  Review of Systems:  A 10 point review of systems is negative, except for the pertinent positives and negatives detailed in the HPI.  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  For about 2 months w/ on/off pain associated. Check U/A, A1c, and BMP.   Migraines    Seizures (HCC)    Stroke [facility])    had 3 mini strokes   TIA (transient ischemic attack)     Past Surgical History: Past Surgical History:  Procedure Laterality Date   c section     FOOT SURGERY Left 2010   KIDNEY SURGERY  04/1991   MYRINGOTOMY  1996   MYRINGOTOMY WITH TUBE PLACEMENT Bilateral 05/14/2023   Procedure: MYRINGOTOMY WITH TUBE PLACEMENT;  Surgeon: Von Grumbling, MD;  Location: [facility] ORS;  Service: ENT;  Laterality: Bilateral;   WISDOM TOOTH EXTRACTION     at the age of 89    Allergies: Allergies as of 04/19/2024 - Review Complete 04/19/2024  Allergen Reaction Noted   Ciprofloxacin  Nausea And Vomiting 08/29/2015    Medications:  Current Outpatient Medications:    acetaminophen  (TYLENOL ) 500 MG tablet, Take by mouth., Disp: , Rfl:    AIMOVIG 140 MG/ML SOAJ, SMARTSIG:1 Milliliter(s) SUB-Q Every 4 Weeks, Disp: , Rfl:    clindamycin (CLEOCIN T) 1 % lotion, Apply topically 2 (two) times daily., Disp: , Rfl:    clobetasol ointment (TEMOVATE) 0.05 %, Apply 1 Application topically 2 (two) times daily., Disp: , Rfl:    cyanocobalamin 100 MCG tablet, Take 100 mcg by mouth daily., Disp: , Rfl:    escitalopram (LEXAPRO) 10 MG tablet, Take by mouth., Disp: ,  Rfl:    famotidine  (PEPCID ) 20 MG tablet, Take 1 tablet by mouth 2 (two) times daily., Disp: , Rfl:    hydrOXYzine (ATARAX) 25 MG tablet, Take 25 mg by mouth every 8 (eight) hours as needed for anxiety., Disp: , Rfl:    ketoconazole (NIZORAL) 2 % cream, Apply 1 Application topically daily., Disp: , Rfl:    ketoconazole (NIZORAL) 2 % shampoo, Apply 1 Application topically 2 (two) times a week., Disp: , Rfl:    levonorgestrel (MIRENA) 20 MCG/24HR IUD, 1 each by Intrauterine route once., Disp: , Rfl:    magnesium 30 MG tablet, Take 30 mg by  mouth 2 (two) times daily., Disp: , Rfl:    metFORMIN (GLUCOPHAGE) 500 MG tablet, Take 500 mg by mouth daily with breakfast., Disp: , Rfl:    metroNIDAZOLE  (METROGEL ) 0.75 % gel, Apply twice daily to rash on face, Disp: , Rfl:    naproxen  (NAPROSYN ) 500 MG tablet, Take 1 tablet (500 mg total) by mouth 2 (two) times daily with a meal., Disp: 30 tablet, Rfl: 0   NURTEC 75 MG TBDP, Take by mouth., Disp: , Rfl:    ondansetron  (ZOFRAN -ODT) 4 MG disintegrating tablet, , Disp: , Rfl:    predniSONE  (DELTASONE ) 50 MG tablet, Take one tablet per day for 4 days., Disp: 4 tablet, Rfl: 0   SYMBICORT 160-4.5 MCG/ACT inhaler, , Disp: , Rfl:    VENTOLIN  HFA 108 (90 Base) MCG/ACT inhaler, Inhale 2 puffs into the lungs every 6 (six) hours as needed., Disp: , Rfl:    atorvastatin (LIPITOR) 80 MG tablet, Take by mouth., Disp: , Rfl:   Social History: Social History   Tobacco Use   Smoking status: Former    Current packs/day: 0.00    Types: Cigarettes    Quit date: 05/12/2023    Years since quitting: 0.9   Smokeless tobacco: Never  Vaping Use   Vaping status: Former  Substance Use Topics   Alcohol use: Yes    Comment: rare   Drug use: Yes    Types: Marijuana    Family Medical History: History reviewed. No pertinent family history.  Physical Examination: Vitals:   04/19/24 1304  BP: (!) 142/90    General: Patient is in no apparent distress. Attention to examination is appropriate.  Neck:   Supple.  Full range of motion.  Respiratory: Patient is breathing without any difficulty.   NEUROLOGICAL:     Awake, alert, oriented to person, place, and time.  Speech is clear and fluent.   Cranial Nerves: Pupils equal round and reactive to light.  Facial tone is symmetric.  Facial sensation is symmetric. Shoulder shrug is symmetric. Tongue protrusion is midline.    Strength today:  Side Iliopsoas Quads Hamstring PF DF EHL  R 5 5 5 5 5 5   L 5 5 4  2-3 5 4    Strength taken from [facility] Frisbie's  note on 03/26/2024:   Side Iliopsoas Quads Hamstring PF DF EHL  R 5 5 5 5 5 5   L 4+ 5 5 4- 5 4   Decreased reflexes at the left Achilles, 2+ at the left patella  Straight leg raise is positive  Imaging: Narrative & Impression  CLINICAL DATA:  Sciatic pain with tingling in the left leg. Associated weakness. Symptoms worsening recently.   EXAM: MRI LUMBAR SPINE WITHOUT CONTRAST   TECHNIQUE: Multiplanar, multisequence MR imaging of the lumbar spine was performed. No intravenous contrast was administered.   COMPARISON:  Radiography 03/26/2024.  MRI 05/22/2008.   FINDINGS: Segmentation:  5 lumbar type vertebral bodies.   Alignment:  Normal alignment.   Vertebrae: Chronic superior endplate deformity at L1. No other focal bone finding.   Conus medullaris and cauda equina: Conus extends to the L1 level. Conus and cauda equina appear normal.   Paraspinal and other soft tissues: Negative   Disc levels:   No abnormality through the L3-4 level.   L4-5: No disc abnormality. Facet osteoarthritis worse on the left than the right. No neural compression.   L5-S1: Central to left posterolateral disc herniation without upward or downward migration. Stenosis of the left lateral recess with likely compression of the left S1 nerve.   IMPRESSION: 1. L5-S1: Central to left posterolateral disc herniation without upward or downward migration. Stenosis of the left lateral recess with likely compression of the left S1 nerve. 2. L4-5: Facet osteoarthritis worse on the left than the right. No neural compression.     Electronically Signed   By: Bettylou Brunner M.D.   On: 04/10/2024 11:41   I have personally reviewed the images and agree with the above interpretation.  Medical Decision Making/Assessment and Plan: [name] is a pleasant 33 y.o. female with a severe left-sided lumbosacral radiculopathy, this is causing left-sided lower extremity weakness especially in plantarflexion.   Approximately a month ago she was 4-4 minus, today she is 2-3 out of 5.  She appears to have continued worsening weakness pain numbness and tingling in her left lower extremity secondary to a herniation at L5-S1 causing compression of her S1 nerve root.  She has failed oral therapy.  She continues to worsen and has increased pain and weakness.  Given these findings we did recommend a L5-S1 discectomy on the left, we did discuss that we may have to convert to open for full access, however we would attempt this with a tubular discectomy.  We also d discussed other risks including CSF leak, nerve injury, need for further surgery, failure to fully improve her symptoms.  Thank you for involving me in the care of this patient.    Carroll Clamp MD/MSCR Neurosurgery

## 2024-04-19 ENCOUNTER — Encounter: Payer: Self-pay | Admitting: Neurosurgery

## 2024-04-19 ENCOUNTER — Ambulatory Visit (INDEPENDENT_AMBULATORY_CARE_PROVIDER_SITE_OTHER): Admitting: Neurosurgery

## 2024-04-19 VITALS — BP 142/90 | Ht 69.0 in | Wt 343.0 lb

## 2024-04-19 DIAGNOSIS — M5106 Intervertebral disc disorders with myelopathy, lumbar region: Secondary | ICD-10-CM | POA: Insufficient documentation

## 2024-04-19 DIAGNOSIS — M5416 Radiculopathy, lumbar region: Secondary | ICD-10-CM | POA: Insufficient documentation

## 2024-04-19 DIAGNOSIS — M5117 Intervertebral disc disorders with radiculopathy, lumbosacral region: Secondary | ICD-10-CM

## 2024-04-19 MED ORDER — METHYLPREDNISOLONE 4 MG PO TBPK: ORAL_TABLET | ORAL | 0 refills | Status: DC

## 2024-04-19 MED ORDER — CYCLOBENZAPRINE HCL 10 MG PO TABS: 10.0000 mg | ORAL_TABLET | Freq: Three times a day (TID) | ORAL | 0 refills | Status: DC | PRN

## 2024-04-19 NOTE — Patient Instructions (Addendum)
 Please see below for information in regards to your upcoming surgery:   Planned surgery: Left  L5-S1 Microdiscectomy    Surgery date: 03/30/2024  at [facility] (Medical Mall: [address], [place], [place] [address]) - you will find out your arrival time the business day before your surgery.   Pre-op appointment at [facility] Pre-admit Testing: you will receive a call with a date/time for this appointment. If you are scheduled for an in person appointment, Pre-admit Testing is located on the first floor of the [facility] building, [address] [facility], [address]. During this appointment, they will advise you which medications you can take the morning of surgery, and which medications you will need to hold for surgery. Labs (such as blood work, EKG) may be done at your pre-op appointment. You are not required to fast for these labs. Should you need to change your pre-op appointment, please call Pre-admit testing at [phone].    Diabetes/weight loss medications: Per anesthesia guidelines (due to the increased risk of aspiration caused by delayed gastric emptying):  Metformin: hold for 2 days prior to surgery.    Surgical clearance: we will send a clearance form to Dr. Fel[facility]). They may wish to see you in their office prior to signing the clearance form. If so, they may call you to schedule an appointment.  Common restrictions after surgery: No bending, lifting, or twisting ("BLT"). Avoid lifting objects heavier than 10 pounds for the first 6 weeks after surgery. Where possible, avoid household activities that involve lifting, bending, reaching, pushing, or pulling such as laundry, vacuuming, grocery shopping, and childcare. Try to arrange for help from friends and family for these activities while you heal. Do not drive while taking prescription pain medication. Weeks 6 through 12 after surgery: avoid lifting more than 25 pounds.     How to contact us :  If you have any questions/concerns before or after surgery, you can reach us  at [phone], or you can send a mychart message. We can be reached by phone or mychart 8am-4pm, Monday-Friday.  *Please note: Calls after 4pm are forwarded to a third party answering service. Mychart messages are not routinely monitored during evenings, weekends, and holidays. Please call our office to contact the answering service for urgent concerns during non-business hours.  If you have FMLA/disability paperwork, please drop it off or fax it to [phone], attention Patty.  Appointments/FMLA & disability paperwork: Gerlean Kocher, & Maryann Smalls Registered Nurses/Surgery schedulers: Kendelyn & Demian Maisel Medical Assistants: Donnajean Fuse Physician Assistants: Ludwig Safer, PA-C, Anastacio Karvonen, PA-C & Lucetta Russel, PA-C Surgeons: Jodeen Munch, MD & Henderson Lock, MD   [facility] PREADMIT TESTING VISIT and SURGERY INFORMATION SHEET   Now that surgery has been scheduled you can anticipate several phone calls from [facility] services. A pharmacy technician will call you to verify your current list of medications taken at home.               The Pre-Service Center will call to verify your insurance information and to give you billing estimates and information.             The Preadmit Testing Office will be calling to schedule a visit to obtain information for the anesthesia team and provide instructions on preparation for surgery.  What can you expect for the Preadmit Testing Visit: Appointments may be scheduled in-person or by telephone.  If a telephone visit is scheduled, you may be asked  to come into the office to have lab tests or other studies performed.   This visit will not be completed any greater than 14 days prior to your surgery.  If your surgery has been scheduled for a future date, please do not be alarmed if we have not contacted you to schedule an  appointment more than a month prior to the surgery date.    Please be prepared to provide the following information during this appointment:            -Personal medical history                                               -Medication and allergy list            -Any history of problems with anesthesia              -Recent lab work or diagnostic studies            -Please notify us  of any needs we should be aware of to provide the best care possible           -You will be provided with instructions on how to prepare for your surgery.    On The Day of Surgery:  You must have a driver to take you home after surgery, you will be asked not to drive for 24 hours following surgery.  Taxi, [employer] and non-medical transport will not be acceptable means of transportation unless you have a responsible individual who will be traveling with you.  Visitors in the surgical area:   2 people will be able to visit you in your room once your preparation for surgery has been completed. During surgery, your visitors will be asked to wait in the Surgery Waiting Area.  It is not a requirement for them to stay, if they prefer to leave and come back.  Your visitor(s) will be given an update once the surgery has been completed.  No visitors are allowed in the initial recovery room to respect patient privacy and safety.  Once you are more awake and transfer to the secondary recovery area, or are transferred to an inpatient room, visitors will again be able to see you.  To respect and protect your privacy: We will ask on the day of surgery who your driver will be and what the contact number for that individual will be. We will ask if it is okay to share information with this individual, or if there is an alternative individual that we, or the surgeon, should contact to provide updates and information. If family or friends come to the surgical information desk requesting information about you, who you have not listed with  us , no information will be given.   It may be helpful to designate someone as the main contact who will be responsible for updating your other friends and family.    PREADMIT TESTING OFFICE: [phone] SAME DAY SURGERY: [phone] We look forward to caring for you before and throughout the process of your surgery.

## 2024-04-23 ENCOUNTER — Encounter
Admission: RE | Admit: 2024-04-23 | Discharge: 2024-04-23 | Disposition: A | Discharge status: Home / Self Care | Source: Ambulatory Visit | Attending: Neurosurgery | Admitting: Neurosurgery

## 2024-04-23 ENCOUNTER — Other Ambulatory Visit: Payer: Self-pay

## 2024-04-23 VITALS — BP 121/85 | HR 70 | Resp 16 | Ht 69.0 in | Wt 325.6 lb

## 2024-04-23 DIAGNOSIS — Z8673 Personal history of transient ischemic attack (TIA), and cerebral infarction without residual deficits: Secondary | ICD-10-CM | POA: Diagnosis not present

## 2024-04-23 DIAGNOSIS — N289 Disorder of kidney and ureter, unspecified: Secondary | ICD-10-CM

## 2024-04-23 DIAGNOSIS — M5106 Intervertebral disc disorders with myelopathy, lumbar region: Secondary | ICD-10-CM | POA: Diagnosis not present

## 2024-04-23 DIAGNOSIS — Z01812 Encounter for preprocedural laboratory examination: Secondary | ICD-10-CM | POA: Diagnosis present

## 2024-04-23 DIAGNOSIS — M5416 Radiculopathy, lumbar region: Secondary | ICD-10-CM | POA: Diagnosis not present

## 2024-04-23 HISTORY — DX: Gastro-esophageal reflux disease without esophagitis: K21.9

## 2024-04-23 HISTORY — DX: Depression, unspecified: F32.A

## 2024-04-23 HISTORY — DX: Unspecified osteoarthritis, unspecified site: M19.90

## 2024-04-23 HISTORY — DX: Fibromyalgia: M79.7

## 2024-04-23 HISTORY — DX: Other intervertebral disc displacement, lumbar region: M51.26

## 2024-04-23 HISTORY — DX: Other complications of anesthesia, initial encounter: T88.59XA

## 2024-04-23 HISTORY — DX: Pneumonia, unspecified organism: J18.9

## 2024-04-23 HISTORY — DX: Radiculopathy, lumbosacral region: M54.17

## 2024-04-23 HISTORY — DX: Post-traumatic stress disorder, unspecified: F43.10

## 2024-04-23 HISTORY — DX: Moderate persistent asthma, uncomplicated: J45.40

## 2024-04-23 LAB — URINALYSIS, COMPLETE (UACMP) WITH MICROSCOPIC
Bilirubin Urine: NEGATIVE
Glucose, UA: NEGATIVE mg/dL
Hgb urine dipstick: NEGATIVE
Ketones, ur: NEGATIVE mg/dL
Leukocytes,Ua: NEGATIVE
Nitrite: NEGATIVE
Protein, ur: NEGATIVE mg/dL
Specific Gravity, Urine: 1.016 (ref 1.005–1.030)
pH: 5 (ref 5.0–8.0)

## 2024-04-23 LAB — TYPE AND SCREEN
ABO/RH(D): A POS
Antibody Screen: NEGATIVE

## 2024-04-23 LAB — SURGICAL PCR SCREEN
MRSA, PCR: NEGATIVE
Staphylococcus aureus: NEGATIVE

## 2024-04-23 NOTE — Patient Instructions (Addendum)
 Your procedure is scheduled on: 04/29/24 - Thursday Report to the Registration Desk on the 1st floor of the [facility]. To find out your arrival time, please call [phone] between 1PM - 3PM on: 04/28/24 - Wednesday If your arrival time is 6:00 am, do not arrive before that time as the [facility] entrance doors do not open until 6:00 am.  REMEMBER: Instructions that are not followed completely may result in serious medical risk, up to and including death; or upon the discretion of your surgeon and anesthesiologist your surgery may need to be rescheduled.  Do not eat food after midnight the night before surgery.  No gum chewing or hard candies.  You may however, drink CLEAR liquids up to 2 hours before you are scheduled to arrive for your surgery. Do not drink anything within 2 hours of your scheduled arrival time.  Clear liquids include: - water  - apple juice without pulp - gatorade (not RED colors) - black coffee or tea (Do NOT add milk or creamers to the coffee or tea) Do NOT drink anything that is not on this list.  You may take as needed,  Anti-inflammatories (NSAIDS) such as Advil , Aleve , Ibuprofen , Motrin , Naproxen , Naprosyn  and Aspirin based products such as Excedrin, Goody's Powder, BC Powder. You may take Tylenol  if needed for pain up until the day of surgery.  Metformin: hold for 2 days prior to surgery.   Stop ANY OVER THE COUNTER supplements until after surgery.  ON THE DAY OF SURGERY ONLY TAKE THESE MEDICATIONS WITH SIPS OF WATER:  escitalopram (LEXAPRO)  famotidine  (PEPCID )  SYMBICORT   Use inhaler VENTOLIN  HFA  on the day of surgery and bring to the hospital.   No Alcohol for 24 hours before or after surgery.  No Smoking including e-cigarettes for 24 hours before surgery.  No chewable tobacco products for at least 6 hours before surgery.  No nicotine patches on the day of surgery.  Do not use any "recreational" drugs for at least a week (preferably 2  weeks) before your surgery.  Please be advised that the combination of cocaine and anesthesia may have negative outcomes, up to and including death. If you test positive for cocaine, your surgery will be cancelled.  On the morning of surgery brush your teeth with toothpaste and water, you may rinse your mouth with mouthwash if you wish. Do not swallow any toothpaste or mouthwash.  Use CHG Soap or wipes as directed on instruction sheet.  Do not wear jewelry, make-up, hairpins, clips or nail polish.  For welded (permanent) jewelry: bracelets, anklets, waist bands, etc.  Please have this removed prior to surgery.  If it is not removed, there is a chance that hospital personnel will need to cut it off on the day of surgery.  Do not wear lotions, powders, or perfumes.   Contact lenses, hearing aids and dentures may not be worn into surgery.  Do not bring valuables to the hospital. [facility] is not responsible for any missing/lost belongings or valuables.   Notify your doctor if there is any change in your medical condition (cold, fever, infection).  Wear comfortable clothing (specific to your surgery type) to the hospital.  After surgery, you can help prevent lung complications by doing breathing exercises.  Take deep breaths and cough every 1-2 hours. Your doctor may order a device called an Incentive Spirometer to help you take deep breaths.  When coughing or sneezing, hold a pillow firmly against your incision with  both hands. This is called "splinting." Doing this helps protect your incision. It also decreases belly discomfort.  If you are being admitted to the hospital overnight, leave your suitcase in the car. After surgery it may be brought to your room.  In case of increased patient census, it may be necessary for you, the patient, to continue your postoperative care in the Same Day Surgery department.  If you are being discharged the day of surgery, you will not be allowed to  drive home. You will need a responsible individual to drive you home and stay with you for 24 hours after surgery.   If you are taking public transportation, you will need to have a responsible individual with you.  Please call the Pre-admissions Testing Dept. at [phone] if you have any questions about these instructions.  Surgery Visitation Policy:  Patients having surgery or a procedure may have two visitors.  Children under the age of 25 must have an adult with them who is not the patient.  Inpatient Visitation:    Visiting hours are 7 a.m. to 8 p.m. Up to four visitors are allowed at one time in a patient room. The visitors may rotate out with other people during the day.  One visitor age 41 or older may stay with the patient overnight and must be in the room by 8 p.m.   Pre-operative 5 CHG Bath Instructions   You can play a key role in reducing the risk of infection after surgery. Your skin needs to be as free of germs as possible. You can reduce the number of germs on your skin by washing with CHG (chlorhexidine  gluconate) soap before surgery. CHG is an antiseptic soap that kills germs and continues to kill germs even after washing.   DO NOT use if you have an allergy to chlorhexidine /CHG or antibacterial soaps. If your skin becomes reddened or irritated, stop using the CHG and notify one of our RNs at [phone].   Please shower with the CHG soap starting 4 days before surgery using the following schedule:   04/27 - 05/01.    Please keep in mind the following:  DO NOT shave, including legs and underarms, starting the day of your first shower.   You may shave your face at any point before/day of surgery.  Place clean sheets on your bed the day you start using CHG soap. Use a clean washcloth (not used since being washed) for each shower. DO NOT sleep with pets once you start using the CHG.   CHG Shower Instructions:  If you choose to wash your hair and private  area, wash first with your normal shampoo/soap.  After you use shampoo/soap, rinse your hair and body thoroughly to remove shampoo/soap residue.  Turn the water OFF and apply about 3 tablespoons (45 ml) of CHG soap to a CLEAN washcloth.  Apply CHG soap ONLY FROM YOUR NECK DOWN TO YOUR TOES (washing for 3-5 minutes)  DO NOT use CHG soap on face, private areas, open wounds, or sores.  Pay special attention to the area where your surgery is being performed.  If you are having back surgery, having someone wash your back for you may be helpful. Wait 2 minutes after CHG soap is applied, then you may rinse off the CHG soap.  Pat dry with a clean towel  Put on clean clothes/pajamas   If you choose to wear lotion, please use ONLY the CHG-compatible lotions on the back of this paper.  Additional instructions for the day of surgery: DO NOT APPLY any lotions, deodorants, cologne, or perfumes.   Put on clean/comfortable clothes.  Brush your teeth.  Ask your nurse before applying any prescription medications to the skin.      CHG Compatible Lotions   Aveeno Moisturizing lotion  Cetaphil Moisturizing Cream  Cetaphil Moisturizing Lotion  Clairol Herbal Essence Moisturizing Lotion, Dry Skin  Clairol Herbal Essence Moisturizing Lotion, Extra Dry Skin  Clairol Herbal Essence Moisturizing Lotion, Normal Skin  Curel Age Defying Therapeutic Moisturizing Lotion with Alpha Hydroxy  Curel Extreme Care Body Lotion  Curel Soothing Hands Moisturizing Hand Lotion  Curel Therapeutic Moisturizing Cream, Fragrance-Free  Curel Therapeutic Moisturizing Lotion, Fragrance-Free  Curel Therapeutic Moisturizing Lotion, Original Formula  Eucerin Daily Replenishing Lotion  Eucerin Dry Skin Therapy Plus Alpha Hydroxy Crme  Eucerin Dry Skin Therapy Plus Alpha Hydroxy Lotion  Eucerin Original Crme  Eucerin Original Lotion  Eucerin Plus Crme Eucerin Plus Lotion  Eucerin TriLipid Replenishing Lotion  Keri  Anti-Bacterial Hand Lotion  Keri Deep Conditioning Original Lotion Dry Skin Formula Softly Scented  Keri Deep Conditioning Original Lotion, Fragrance Free Sensitive Skin Formula  Keri Lotion Fast Absorbing Fragrance Free Sensitive Skin Formula  Keri Lotion Fast Absorbing Softly Scented Dry Skin Formula  Keri Original Lotion  Keri Skin Renewal Lotion Keri Silky Smooth Lotion  Keri Silky Smooth Sensitive Skin Lotion  Nivea Body Creamy Conditioning Oil  Nivea Body Extra Enriched Teacher, adult education Moisturizing Lotion Nivea Crme  Nivea Skin Firming Lotion  NutraDerm 30 Skin Lotion  NutraDerm Skin Lotion  NutraDerm Therapeutic Skin Cream  NutraDerm Therapeutic Skin Lotion  ProShield Protective Hand Cream  Provon moisturizing lotion

## 2024-04-26 ENCOUNTER — Encounter: Payer: Self-pay | Admitting: Neurosurgery

## 2024-04-26 NOTE — Telephone Encounter (Signed)
 Sent message to pre-admit testing to ask about this

## 2024-04-29 ENCOUNTER — Telehealth: Payer: Self-pay | Admitting: Neurosurgery

## 2024-04-29 ENCOUNTER — Ambulatory Visit: Admitting: Certified Registered"

## 2024-04-29 ENCOUNTER — Encounter: Payer: Self-pay | Admitting: Neurosurgery

## 2024-04-29 ENCOUNTER — Ambulatory Visit: Payer: Self-pay | Admitting: Urgent Care

## 2024-04-29 ENCOUNTER — Other Ambulatory Visit: Payer: Self-pay

## 2024-04-29 ENCOUNTER — Other Ambulatory Visit: Payer: Self-pay | Admitting: Neurosurgery

## 2024-04-29 ENCOUNTER — Ambulatory Visit

## 2024-04-29 ENCOUNTER — Ambulatory Visit
Admission: RE | Admit: 2024-04-29 | Discharge: 2024-04-29 | Disposition: A | Discharge status: Home / Self Care | Attending: Neurosurgery | Admitting: Neurosurgery

## 2024-04-29 ENCOUNTER — Encounter
Admission: RE | Disposition: A | Discharge status: Home / Self Care | Payer: Self-pay | Source: Home / Self Care | Attending: Neurosurgery

## 2024-04-29 DIAGNOSIS — M5416 Radiculopathy, lumbar region: Secondary | ICD-10-CM | POA: Diagnosis present

## 2024-04-29 DIAGNOSIS — M545 Low back pain, unspecified: Secondary | ICD-10-CM | POA: Diagnosis present

## 2024-04-29 DIAGNOSIS — I129 Hypertensive chronic kidney disease with stage 1 through stage 4 chronic kidney disease, or unspecified chronic kidney disease: Secondary | ICD-10-CM | POA: Insufficient documentation

## 2024-04-29 DIAGNOSIS — M4726 Other spondylosis with radiculopathy, lumbar region: Secondary | ICD-10-CM | POA: Diagnosis not present

## 2024-04-29 DIAGNOSIS — M797 Fibromyalgia: Secondary | ICD-10-CM | POA: Insufficient documentation

## 2024-04-29 DIAGNOSIS — M48061 Spinal stenosis, lumbar region without neurogenic claudication: Secondary | ICD-10-CM | POA: Insufficient documentation

## 2024-04-29 DIAGNOSIS — F418 Other specified anxiety disorders: Secondary | ICD-10-CM | POA: Insufficient documentation

## 2024-04-29 DIAGNOSIS — N189 Chronic kidney disease, unspecified: Secondary | ICD-10-CM | POA: Diagnosis not present

## 2024-04-29 DIAGNOSIS — M5106 Intervertebral disc disorders with myelopathy, lumbar region: Secondary | ICD-10-CM | POA: Diagnosis present

## 2024-04-29 DIAGNOSIS — K219 Gastro-esophageal reflux disease without esophagitis: Secondary | ICD-10-CM | POA: Insufficient documentation

## 2024-04-29 DIAGNOSIS — Z79899 Other long term (current) drug therapy: Secondary | ICD-10-CM | POA: Insufficient documentation

## 2024-04-29 DIAGNOSIS — Z8673 Personal history of transient ischemic attack (TIA), and cerebral infarction without residual deficits: Secondary | ICD-10-CM | POA: Diagnosis not present

## 2024-04-29 DIAGNOSIS — Z01812 Encounter for preprocedural laboratory examination: Secondary | ICD-10-CM

## 2024-04-29 DIAGNOSIS — M5117 Intervertebral disc disorders with radiculopathy, lumbosacral region: Secondary | ICD-10-CM | POA: Insufficient documentation

## 2024-04-29 DIAGNOSIS — M5116 Intervertebral disc disorders with radiculopathy, lumbar region: Secondary | ICD-10-CM | POA: Insufficient documentation

## 2024-04-29 DIAGNOSIS — E66813 Obesity, class 3: Secondary | ICD-10-CM | POA: Diagnosis not present

## 2024-04-29 DIAGNOSIS — M1990 Unspecified osteoarthritis, unspecified site: Secondary | ICD-10-CM | POA: Insufficient documentation

## 2024-04-29 DIAGNOSIS — M4716 Other spondylosis with myelopathy, lumbar region: Secondary | ICD-10-CM | POA: Insufficient documentation

## 2024-04-29 LAB — POCT PREGNANCY, URINE: Preg Test, Ur: NEGATIVE

## 2024-04-29 LAB — ABO/RH: ABO/RH(D): A POS

## 2024-04-29 SURGERY — LUMBAR LAMINECTOMY/DECOMPRESSION MICRODISCECTOMY 1 LEVEL
Anesthesia: General | Site: Spine Lumbar | Laterality: Left

## 2024-04-29 MED ORDER — OXYCODONE HCL 5 MG PO TABS: 5.0000 mg | ORAL_TABLET | ORAL | 0 refills | Status: AC | PRN

## 2024-04-29 MED ORDER — SENNA 8.6 MG PO TABS: 1.0000 | ORAL_TABLET | Freq: Every day | ORAL | 0 refills | Status: DC

## 2024-04-29 MED ORDER — LACTATED RINGERS IV SOLN: INTRAVENOUS | Status: DC

## 2024-04-29 MED ORDER — OXYCODONE HCL 5 MG PO TABS
5.0000 mg | ORAL_TABLET | Freq: Once | ORAL | Status: AC | PRN
  Administered 2024-04-29: 5 mg via ORAL

## 2024-04-29 MED ORDER — DEXMEDETOMIDINE HCL IN NACL 80 MCG/20ML IV SOLN
INTRAVENOUS | Status: DC | PRN
  Administered 2024-04-29 (×2): 8 ug via INTRAVENOUS

## 2024-04-29 MED ORDER — ONDANSETRON HCL 4 MG/2ML IJ SOLN: 4.0000 mg | Freq: Once | INTRAMUSCULAR | Status: DC | PRN

## 2024-04-29 MED ORDER — PROMETHAZINE HCL 25 MG PO TABS: 25.0000 mg | ORAL_TABLET | Freq: Four times a day (QID) | ORAL | 0 refills | Status: AC | PRN

## 2024-04-29 MED ORDER — FENTANYL CITRATE (PF) 100 MCG/2ML IJ SOLN
25.0000 ug | INTRAMUSCULAR | Status: DC | PRN
  Administered 2024-04-29 (×3): 25 ug via INTRAVENOUS

## 2024-04-29 MED ORDER — METHYLPREDNISOLONE ACETATE 40 MG/ML IJ SUSP
INTRAMUSCULAR | Status: DC | PRN
  Administered 2024-04-29: 40 mg

## 2024-04-29 MED ORDER — CEFAZOLIN SODIUM-DEXTROSE 3-4 GM/150ML-% IV SOLN
3.0000 g | Freq: Once | INTRAVENOUS | Status: AC
  Administered 2024-04-29: 3 g via INTRAVENOUS
  Filled 2024-04-29: qty 150

## 2024-04-29 MED ORDER — DEXMEDETOMIDINE HCL IN NACL 80 MCG/20ML IV SOLN
INTRAVENOUS | Status: AC
  Filled 2024-04-29: qty 20

## 2024-04-29 MED ORDER — SODIUM CHLORIDE (PF) 0.9 % IJ SOLN
INTRAMUSCULAR | Status: AC
Start: 2024-04-29 — End: ?
  Filled 2024-04-29: qty 40

## 2024-04-29 MED ORDER — MIDAZOLAM HCL 2 MG/2ML IJ SOLN
INTRAMUSCULAR | Status: AC
  Filled 2024-04-29: qty 2

## 2024-04-29 MED ORDER — ONDANSETRON HCL 4 MG/2ML IJ SOLN
INTRAMUSCULAR | Status: AC
  Filled 2024-04-29: qty 2

## 2024-04-29 MED ORDER — SODIUM CHLORIDE (PF) 0.9 % IJ SOLN
INTRAMUSCULAR | Status: DC | PRN
  Administered 2024-04-29: 60 mL

## 2024-04-29 MED ORDER — PROPOFOL 10 MG/ML IV BOLUS
INTRAVENOUS | Status: AC
  Filled 2024-04-29: qty 20

## 2024-04-29 MED ORDER — OXYCODONE HCL 5 MG/5ML PO SOLN: 5.0000 mg | Freq: Once | ORAL | Status: AC | PRN

## 2024-04-29 MED ORDER — CYCLOBENZAPRINE HCL 10 MG PO TABS: 10.0000 mg | ORAL_TABLET | Freq: Three times a day (TID) | ORAL | 0 refills | Status: DC | PRN

## 2024-04-29 MED ORDER — LIDOCAINE HCL (CARDIAC) PF 100 MG/5ML IV SOSY
PREFILLED_SYRINGE | INTRAVENOUS | Status: DC | PRN
  Administered 2024-04-29: 100 mg via INTRAVENOUS

## 2024-04-29 MED ORDER — SODIUM CHLORIDE (PF) 0.9 % IJ SOLN
INTRAMUSCULAR | Status: AC
  Filled 2024-04-29: qty 10

## 2024-04-29 MED ORDER — KETAMINE HCL 50 MG/5ML IJ SOSY
PREFILLED_SYRINGE | INTRAMUSCULAR | Status: AC
  Filled 2024-04-29: qty 5

## 2024-04-29 MED ORDER — ROCURONIUM BROMIDE 100 MG/10ML IV SOLN
INTRAVENOUS | Status: DC | PRN
Start: 2024-04-29 — End: 2024-04-29
  Administered 2024-04-29: 40 mg via INTRAVENOUS

## 2024-04-29 MED ORDER — METHYLPREDNISOLONE ACETATE 40 MG/ML IJ SUSP
INTRAMUSCULAR | Status: AC
  Filled 2024-04-29: qty 1

## 2024-04-29 MED ORDER — ROCURONIUM BROMIDE 10 MG/ML (PF) SYRINGE
PREFILLED_SYRINGE | INTRAVENOUS | Status: AC
  Filled 2024-04-29: qty 10

## 2024-04-29 MED ORDER — BUPIVACAINE LIPOSOME 1.3 % IJ SUSP
INTRAMUSCULAR | Status: AC
  Filled 2024-04-29: qty 40

## 2024-04-29 MED ORDER — ONDANSETRON HCL 4 MG/2ML IJ SOLN
INTRAMUSCULAR | Status: DC | PRN
  Administered 2024-04-29: 4 mg via INTRAVENOUS

## 2024-04-29 MED ORDER — BUPIVACAINE HCL (PF) 0.5 % IJ SOLN
INTRAMUSCULAR | Status: AC
  Filled 2024-04-29: qty 60

## 2024-04-29 MED ORDER — ACETAMINOPHEN 10 MG/ML IV SOLN
INTRAVENOUS | Status: DC | PRN
  Administered 2024-04-29: 1000 mg via INTRAVENOUS

## 2024-04-29 MED ORDER — SUCCINYLCHOLINE CHLORIDE 200 MG/10ML IV SOSY
PREFILLED_SYRINGE | INTRAVENOUS | Status: AC
  Filled 2024-04-29: qty 10

## 2024-04-29 MED ORDER — CEFAZOLIN SODIUM-DEXTROSE 2-4 GM/100ML-% IV SOLN
INTRAVENOUS | Status: AC
  Filled 2024-04-29: qty 100

## 2024-04-29 MED ORDER — CHLORHEXIDINE GLUCONATE 0.12 % MT SOLN
15.0000 mL | Freq: Once | OROMUCOSAL | Status: AC
  Administered 2024-04-29: 15 mL via OROMUCOSAL

## 2024-04-29 MED ORDER — ACETAMINOPHEN 10 MG/ML IV SOLN: 1000.0000 mg | Freq: Once | INTRAVENOUS | Status: DC | PRN

## 2024-04-29 MED ORDER — SUCCINYLCHOLINE CHLORIDE 200 MG/10ML IV SOSY
PREFILLED_SYRINGE | INTRAVENOUS | Status: DC | PRN
  Administered 2024-04-29: 200 mg via INTRAVENOUS

## 2024-04-29 MED ORDER — FENTANYL CITRATE (PF) 100 MCG/2ML IJ SOLN
INTRAMUSCULAR | Status: AC
  Filled 2024-04-29: qty 2

## 2024-04-29 MED ORDER — BUPIVACAINE-EPINEPHRINE (PF) 0.5% -1:200000 IJ SOLN
INTRAMUSCULAR | Status: AC
  Filled 2024-04-29: qty 30

## 2024-04-29 MED ORDER — KETAMINE HCL 50 MG/5ML IJ SOSY
PREFILLED_SYRINGE | INTRAMUSCULAR | Status: DC | PRN
  Administered 2024-04-29: 20 mg via INTRAVENOUS
  Administered 2024-04-29: 10 mg via INTRAVENOUS

## 2024-04-29 MED ORDER — GLYCOPYRROLATE 0.2 MG/ML IJ SOLN
INTRAMUSCULAR | Status: DC | PRN
  Administered 2024-04-29: .1 mg via INTRAVENOUS

## 2024-04-29 MED ORDER — 0.9 % SODIUM CHLORIDE (POUR BTL) OPTIME
TOPICAL | Status: DC | PRN
  Administered 2024-04-29: 500 mL

## 2024-04-29 MED ORDER — CHLORHEXIDINE GLUCONATE 0.12 % MT SOLN
OROMUCOSAL | Status: AC
Start: 2024-04-29 — End: ?
  Filled 2024-04-29: qty 15

## 2024-04-29 MED ORDER — BUPIVACAINE HCL (PF) 0.5 % IJ SOLN
INTRAMUSCULAR | Status: DC | PRN
Start: 2024-04-29 — End: 2024-04-29
  Administered 2024-04-29: 6 mL

## 2024-04-29 MED ORDER — LIDOCAINE HCL (PF) 2 % IJ SOLN
INTRAMUSCULAR | Status: AC
  Filled 2024-04-29: qty 5

## 2024-04-29 MED ORDER — GLYCOPYRROLATE 0.2 MG/ML IJ SOLN
INTRAMUSCULAR | Status: AC
  Filled 2024-04-29: qty 1

## 2024-04-29 MED ORDER — CEFAZOLIN SODIUM-DEXTROSE 2-4 GM/100ML-% IV SOLN: 3.0000 g | Freq: Once | INTRAVENOUS | Status: DC

## 2024-04-29 MED ORDER — MIDAZOLAM HCL 2 MG/2ML IJ SOLN
INTRAMUSCULAR | Status: DC | PRN
  Administered 2024-04-29: 2 mg via INTRAVENOUS

## 2024-04-29 MED ORDER — PHENYLEPHRINE HCL-NACL 20-0.9 MG/250ML-% IV SOLN
INTRAVENOUS | Status: AC
  Filled 2024-04-29: qty 250

## 2024-04-29 MED ORDER — ACETAMINOPHEN 10 MG/ML IV SOLN
INTRAVENOUS | Status: AC
  Filled 2024-04-29: qty 100

## 2024-04-29 MED ORDER — SUGAMMADEX SODIUM 200 MG/2ML IV SOLN
INTRAVENOUS | Status: DC | PRN
  Administered 2024-04-29: 300 mg via INTRAVENOUS

## 2024-04-29 MED ORDER — FENTANYL CITRATE (PF) 100 MCG/2ML IJ SOLN
INTRAMUSCULAR | Status: DC | PRN
  Administered 2024-04-29: 100 ug via INTRAVENOUS

## 2024-04-29 MED ORDER — ORAL CARE MOUTH RINSE: 15.0000 mL | Freq: Once | OROMUCOSAL | Status: AC

## 2024-04-29 MED ORDER — PROPOFOL 10 MG/ML IV BOLUS
INTRAVENOUS | Status: DC | PRN
  Administered 2024-04-29: 200 mg via INTRAVENOUS

## 2024-04-29 MED ORDER — DEXAMETHASONE SODIUM PHOSPHATE 10 MG/ML IJ SOLN
INTRAMUSCULAR | Status: DC | PRN
  Administered 2024-04-29: 10 mg via INTRAVENOUS

## 2024-04-29 MED ORDER — DOCUSATE SODIUM 100 MG PO CAPS: 100.0000 mg | ORAL_CAPSULE | Freq: Two times a day (BID) | ORAL | 0 refills | Status: DC

## 2024-04-29 MED ORDER — OXYCODONE HCL 5 MG PO TABS
ORAL_TABLET | ORAL | Status: AC
  Filled 2024-04-29: qty 1

## 2024-04-29 MED ORDER — DEXAMETHASONE SODIUM PHOSPHATE 10 MG/ML IJ SOLN
INTRAMUSCULAR | Status: AC
  Filled 2024-04-29: qty 1

## 2024-04-29 MED ORDER — SURGIFLO WITH THROMBIN (HEMOSTATIC MATRIX KIT) OPTIME
TOPICAL | Status: DC | PRN
  Administered 2024-04-29: 1 via TOPICAL

## 2024-04-29 SURGICAL SUPPLY — 32 items
BASIN KIT SINGLE STR (MISCELLANEOUS) ×1 IMPLANT
BRUSH SCRUB EZ 4% CHG (MISCELLANEOUS) ×1 IMPLANT
BUR NEURO DRILL SOFT 3.0X3.8M (BURR) ×1 IMPLANT
DERMABOND ADVANCED .7 DNX12 (GAUZE/BANDAGES/DRESSINGS) ×1 IMPLANT
DRAPE C-ARM XRAY 36X54 (DRAPES) ×2 IMPLANT
DRAPE LAPAROTOMY 100X77 ABD (DRAPES) ×1 IMPLANT
DRAPE MICROSCOPE SPINE 48X150 (DRAPES) ×1 IMPLANT
DRSG OPSITE POSTOP 3X4 (GAUZE/BANDAGES/DRESSINGS) ×1 IMPLANT
DRSG TEGADERM 4X4.75 (GAUZE/BANDAGES/DRESSINGS) ×1 IMPLANT
ELECTRODE EZSTD 165MM 6.5IN (MISCELLANEOUS) ×1 IMPLANT
ELECTRODE REM PT RTRN 9FT ADLT (ELECTROSURGICAL) ×1 IMPLANT
GLOVE BIOGEL PI IND STRL 7.0 (GLOVE) ×1 IMPLANT
GLOVE BIOGEL PI IND STRL 8 (GLOVE) ×2 IMPLANT
GLOVE SURG SYN 7.0 (GLOVE) ×1 IMPLANT
GLOVE SURG SYN 7.0 PF PI (GLOVE) ×1 IMPLANT
GLOVE SURG SYN 7.5 E (GLOVE) ×1 IMPLANT
GLOVE SURG SYN 7.5 PF PI (GLOVE) ×1 IMPLANT
GOWN SRG XL LVL 3 NONREINFORCE (GOWNS) ×1 IMPLANT
GOWN STRL REUS W/ TWL LRG LVL3 (GOWN DISPOSABLE) ×1 IMPLANT
KIT WILSON FRAME (KITS) ×1 IMPLANT
KNIFE BAYONET SHORT DISCETOMY (MISCELLANEOUS) IMPLANT
NDL SAFETY ECLIPSE 18X1.5 (NEEDLE) ×1 IMPLANT
NS IRRIG 500ML POUR BTL (IV SOLUTION) ×1 IMPLANT
PACK LAMINECTOMY ARMC (PACKS) ×1 IMPLANT
PAD ARMBOARD POSITIONER FOAM (MISCELLANEOUS) ×2 IMPLANT
SURGIFLO W/THROMBIN 8M KIT (HEMOSTASIS) ×1 IMPLANT
SUT STRATA 3-0 15 PS-2 (SUTURE) ×1 IMPLANT
SUT VIC AB 0 CT1 27XCR 8 STRN (SUTURE) ×1 IMPLANT
SUT VIC AB 2-0 CT1 18 (SUTURE) ×1 IMPLANT
SYR 30ML LL (SYRINGE) ×2 IMPLANT
SYR 3ML LL SCALE MARK (SYRINGE) ×1 IMPLANT
TRAP FLUID SMOKE EVACUATOR (MISCELLANEOUS) ×1 IMPLANT

## 2024-04-29 NOTE — Op Note (Signed)
 Indications: Ms. Shiloe Blancett is suffering from lumbar radiculopathy with progressive left lower extremity weakness  Findings: Large herniated disk fragment, causing severe compression of the traversing nerve root    Preoperative Diagnosis:Intervertebral lumbar disc disorder with myelopathy, lumbar region Lumbar radiculopathy  Postoperative Diagnosis: Intervertebral lumbar disc disorder with myelopathy, lumbar region Lumbar radiculopathy   EBL: minimal IVF: see anesthesia record Drains: none Disposition: Extubated and Stable to PACU Complications: none  No foley catheter was placed.   Preoperative Note:   Risks of surgery discussed include: infection, bleeding, stroke, coma, death, paralysis, CSF leak, nerve/spinal cord injury, numbness, tingling, weakness, complex regional pain syndrome, recurrent stenosis and/or disc herniation, vascular injury, development of instability, neck/back pain, need for further surgery, persistent symptoms, development of deformity, and the risks of anesthesia. The patient understood these risks and agreed to proceed.  Operative Note:   1) Left L5/S1 microdiscectomy  The patient was then brought from the preoperative center with intravenous access established.  The patient underwent general anesthesia and endotracheal tube intubation, and was then rotated on the [place] rail top where all pressure points were appropriately padded.  The skin was then thoroughly cleansed.  Perioperative antibiotic prophylaxis was administered.  Sterile prep and drapes were then applied and a timeout was then observed.  C-arm was brought into the field under sterile conditions, and the L5-S1 disc space identified and marked with an incision on the left 1cm lateral to midline.  Once this was complete a 2 cm incision was opened with the use of a #10 blade knife.  The Metrx tubes were sequentially advanced under lateral fluoroscopy until a 18 x 90 mm Metrx tube was placed over  the facet and lamina and secured to the bed.    The microscope was then sterilely brought into the field and muscle creep was hemostased with a bipolar and resected with a pituitary rongeur.  A Bovie extender was then used to expose the spinous process and lamina.  Careful attention was placed to not violate the facet capsule. A 3 mm matchstick drill bit was then used to make a hemi-laminotomy trough until the ligamentum flavum was exposed.  This was extended to the base of the spinous process.  Once this was complete and the underlying ligamentum flavum was visualized, the ligamentum was dissected with an up angle curette and resected with a #2 and #3 mm biting Kerrison.  The laminotomy opening was also expanded in similar fashion and hemostasis was obtained with Surgifoam and a patty as well as bone wax.  The rostral aspect of the caudal level of the lamina was also resected with a #2 biting Kerrison effort to further enhance exposure.  Once the underlying dura was visualized a Penfield 4 was then used to dissect and expose the traversing nerve root.  Once this was identified a nerve root retractor suction was used to mobilize this medially.  The venous plexus was hemostased with Surgifoam and light bipolar use.  A small penfied was then used to make a small annulotomy within the disc space and disc space contents were noted to come through the annulus.    The disc herniation was identified and dissected free using a balltip probe. The pituitary rongeur was used to remove the extruded disc fragments. Once the thecal sac and nerve root were noted to be relaxed and under less tension the ball-tipped feeler was passed along the foramen distally to ensure no residual compression was noted.    Depo-Medrol  was placed along  the nerve root.  The area was irrigated. The tube system was then removed under microscopic visualization and hemostasis was obtained with a bipolar.    The fascial layer was reapproximated  with the use of a 0- Vicryl suture.  Subcutaneous tissue layer was reapproximated using 2-0 Vicryl suture.  3-0 monocryl was used on the skin. The skin was then cleansed and Dermabond was used to close the skin opening.  Patient was then rotated back to the preoperative bed awakened from anesthesia and taken to recovery all counts are correct in this case.  Procedure without assistant surgeon   Carroll Clamp, MD [facility]

## 2024-04-29 NOTE — Interval H&P Note (Signed)
 History and Physical Interval Note:  04/29/2024 7:05 AM  Karen Hunt  has presented today for surgery, with the diagnosis of Intervertebral lumbar disc disorder with myelopathy, lumbar region Lumbar radiculopathy.  The various methods of treatment have been discussed with the patient and family. After consideration of risks, benefits and other options for treatment, the patient has consented to  Procedure(s) with comments: LUMBAR LAMINECTOMY/DECOMPRESSION MICRODISCECTOMY 1 LEVEL (Left) - LEFT L5-S1 MICRODISCECTOMY WITH POSSIBLE CONVERTION TO OPEN as a surgical intervention.  The patient's history has been reviewed, patient examined, no change in status, stable for surgery.  I have reviewed the patient's chart and labs.  Questions were answered to the patient's satisfaction.    Heart and lungs clear   Carroll Clamp

## 2024-04-29 NOTE — Discharge Instructions (Addendum)
 Your surgeon has performed an operation on your lumbar spine (low back) to relieve pressure on one or more nerves. Many times, patients feel better immediately after surgery and can "overdo it." Even if you feel well, it is important that you follow these activity guidelines. If you do not let your back heal properly from the surgery, you can increase the chance of hardware complications and/or return of your symptoms. The following are instructions to help in your recovery once you have been discharged from the hospital.    Activity    No bending, lifting, or twisting ("BLT"). Avoid lifting objects heavier than 10 pounds (gallon milk jug).  Where possible, avoid household activities that involve lifting, bending, pushing, or pulling such as laundry, vacuuming, grocery shopping, and childcare. Try to arrange for help from friends and family for these activities while your back heals.  Increase physical activity slowly as tolerated.  Taking short walks is encouraged, but avoid strenuous exercise. Do not jog, run, bicycle, lift weights, or participate in any other exercises unless specifically allowed by your doctor. Avoid prolonged sitting, including car rides.  Talk to your doctor before resuming sexual activity.  You should not drive until cleared by your doctor.  Until released by your doctor, you should not return to work or school.  You should rest at home and let your body heal.   You may shower three days after your surgery.  After showering, lightly dab your incision dry. Do not take a tub bath or go swimming for 3 weeks, or until approved by your doctor at your follow-up appointment.  If you smoke, we strongly recommend that you quit.  Smoking has been proven to interfere with normal healing in your back and will dramatically reduce the success rate of your surgery. Please contact [facility] (800-QUIT-NOW) and use the resources at www.[facility].com for assistance in stopping  smoking.  Surgical Incision   If you have a dressing on your incision, you may remove it three days after your surgery. Keep your incision area clean and dry.  Your incision was closed with Dermabond glue. The glue should begin to peel away within about a week.  Diet           You may return to your usual diet. Be sure to stay hydrated.  You have been prescribed narcotic pain medications.  This often will cause constipation along with the anesthesia that she underwent.  Please obtain Colace and senna over-the-counter, at times it is not covered by insurance.  You should take a stool softener and laxative for the duration of you taking the narcotic pain medications.  When to Contact Us   Although your surgery and recovery will likely be uneventful, you may have some residual numbness, aches, and pains in your back and/or legs. This is normal and should improve in the next few weeks.  However, should you experience any of the following, contact us  immediately: New numbness or weakness Pain that is progressively getting worse, and is not relieved by your pain medications or rest Bleeding, redness, swelling, pain, or drainage from surgical incision Chills or flu-like symptoms Fever greater than 101.0 F (38.3 C) Problems with bowel or bladder functions Difficulty breathing or shortness of breath Warmth, tenderness, or swelling in your calf  Contact Information How to contact us :  If you have any questions/concerns before or after surgery, you can reach us  at [phone], or you can send a mychart message. We can be reached by phone or mychart 8am-4pm,  Monday-Friday.  *Please note: Calls after 4pm are forwarded to a third party answering service. Mychart messages are not routinely monitored during evenings, weekends, and holidays. Please call our office to contact the answering service for urgent concerns during non-business hours.

## 2024-04-29 NOTE — Anesthesia Preprocedure Evaluation (Signed)
 Anesthesia Evaluation  Patient identified by MRN, date of birth, ID band Patient awake    Reviewed: Allergy & Precautions, NPO status , Patient's Chart, lab work & pertinent test results  History of Anesthesia Complications Negative for: history of anesthetic complications  Airway Mallampati: II  TM Distance: >3 FB Neck ROM: Full    Dental no notable dental hx. (+) Teeth Intact   Pulmonary asthma , neg sleep apnea, neg COPD, Current SmokerPatient did not abstain from smoking. Takes PRN inhalers, took this morning out of precaution   breath sounds clear to auscultation       Cardiovascular Exercise Tolerance: Good METShypertension, Pt. on medications (-) CAD and (-) Past MI (-) dysrhythmias  Rhythm:Regular Rate:Normal - Systolic murmurs    Neuro/Psych  Headaches, Seizures -, Well Controlled,  PSYCHIATRIC DISORDERS Anxiety Depression    TIA   GI/Hepatic ,GERD  Medicated,,(+)     (-) substance abuse    Endo/Other  neg diabetes  Class 3 obesity  Renal/GU Renal diseasenegative Renal ROS     Musculoskeletal  (+) Arthritis ,  Fibromyalgia -Not on chronic opioids   Abdominal  (+) + obese  Peds  Hematology   Anesthesia Other Findings Past Medical History: No date: Anxiety No date: Arthritis     Comment:  osteoarthritis No date: Chronic kidney disease     Comment:  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  For about 2 months w/               on/off pain associated. Check U/A, A1c, and  BMP. No date: Lumbar disc herniation     Comment:  at L5-S1 No date: Lumbosacral radiculopathy No date: Migraines No date: Moderate persistent asthma without complication No date: Pneumonia No date: PTSD (post-traumatic stress disorder) No date: Seizures (HCC) No date: Stroke [facility])     Comment:  had 3 mini strokes No date: TIA (transient ischemic attack)  Reproductive/Obstetrics                             Anesthesia Physical Anesthesia Plan  ASA: 3  Anesthesia Plan: General   Post-op Pain Management: Ofirmev  IV (intra-op)*   Induction: Intravenous  PONV Risk Score and Plan: 3 and Ondansetron , Dexamethasone , Midazolam  and Treatment may vary due to age or medical condition  Airway Management Planned: Oral ETT and Video Laryngoscope Planned  Additional Equipment: None  Intra-op Plan:   Post-operative Plan: Extubation in OR  Informed Consent: I have reviewed the patients History and Physical, chart, labs and discussed the procedure including the risks, benefits and alternatives for the proposed anesthesia with the patient or authorized representative who has indicated his/her understanding and acceptance.     Dental advisory given  Plan Discussed with: CRNA and Surgeon  Anesthesia Plan Comments: (  Discussed risks of anesthesia with patient, including PONV, sore throat, lip/dental/eye damage. Rare risks discussed as well, such as cardiorespiratory and neurological sequelae, and allergic reactions. Discussed the role of CRNA in patient's perioperative care. Patient understands. Patient informed about increased incidence of above perioperative risk due to high BMI. Patient understands.  Patient counseled on benefits of smoking cessation, and increased perioperative risks associated with continued smoking. )       Anesthesia Quick Evaluation

## 2024-04-29 NOTE — Telephone Encounter (Signed)
 Patient left a message with the answering service. Please see below and advise.     Media Information   Document Information  AMB HH/NH/Hospice  NEUROSURGERY ANSWERING SERVICE  04/29/2024 16:33  Attached To:  Karen Hunt  Source Information  Default, Provider, MD  Document History

## 2024-04-29 NOTE — Anesthesia Postprocedure Evaluation (Signed)
 Anesthesia Post Note  Patient: Karen Hunt  Procedure(s) Performed: LUMBAR LAMINECTOMY/DECOMPRESSION MICRODISCECTOMY 1 LEVEL (Left: Spine Lumbar)  Patient location during evaluation: PACU Anesthesia Type: General Level of consciousness: awake and alert Pain management: pain level controlled Vital Signs Assessment: post-procedure vital signs reviewed and stable Respiratory status: spontaneous breathing, nonlabored ventilation, respiratory function stable and patient connected to nasal cannula oxygen Cardiovascular status: blood pressure returned to baseline and stable Postop Assessment: no apparent nausea or vomiting Anesthetic complications: no   No notable events documented.   Last Vitals:  Vitals:   04/29/24 0921 04/29/24 0932  BP:  (!) 143/94  Pulse: 85 73  Resp: 15 16  Temp:    SpO2: 99% 99%    Last Pain:  Vitals:   04/29/24 0932  TempSrc:   PainSc: 4                  Lattie Poli

## 2024-04-29 NOTE — Anesthesia Procedure Notes (Signed)
 Procedure Name: Intubation Date/Time: 04/29/2024 7:26 AM  Performed by: Ellwood Haber, CRNAPre-anesthesia Checklist: Patient identified, Patient being monitored, Timeout performed, Emergency Drugs available and Suction available Patient Re-evaluated:Patient Re-evaluated prior to induction Oxygen Delivery Method: Circle system utilized Preoxygenation: Pre-oxygenation with 100% oxygen Induction Type: IV induction Ventilation: Mask ventilation without difficulty Laryngoscope Size: 3 and McGrath Grade View: Grade I Tube type: Oral Tube size: 7.0 mm Number of attempts: 1 Airway Equipment and Method: Stylet Placement Confirmation: ETT inserted through vocal cords under direct vision, positive ETCO2 and breath sounds checked- equal and bilateral Secured at: 22 cm Tube secured with: Tape Dental Injury: Teeth and Oropharynx as per pre-operative assessment  Comments: Smooth atraumatic intubation, no complications noted.

## 2024-04-29 NOTE — Telephone Encounter (Signed)
 Patient had questions about her discharge paperwork. She did not see her muscle relaxer listed at the top. I explained that this was because she was taking it prior to surgery and it was not a new medication. Patient understood. She is waiting for her medications to be delivered.

## 2024-04-29 NOTE — Transfer of Care (Signed)
 Immediate Anesthesia Transfer of Care Note  Patient: Karen Hunt  Procedure(s) Performed: LUMBAR LAMINECTOMY/DECOMPRESSION MICRODISCECTOMY 1 LEVEL (Left: Spine Lumbar)  Patient Location: PACU  Anesthesia Type:General  Level of Consciousness: awake, alert , and drowsy  Airway & Oxygen Therapy: Patient Spontanous Breathing and Patient connected to face mask oxygen  Post-op Assessment: Report given to RN and Post -op Vital signs reviewed and stable  Post vital signs: Reviewed and stable  Last Vitals:  Vitals Value Taken Time  BP 145/82 04/29/24 0856  Temp 35.8 0856  Pulse 86 04/29/24 0900  Resp 21 04/29/24 0900  SpO2 99 % 04/29/24 0900  Vitals shown include unfiled device data.  Last Pain:  Vitals:   04/29/24 0630  TempSrc: Temporal  PainSc: 8          Complications: No notable events documented.

## 2024-04-30 ENCOUNTER — Encounter: Payer: Self-pay | Admitting: Neurosurgery

## 2024-05-14 ENCOUNTER — Ambulatory Visit: Admitting: Physician Assistant

## 2024-05-14 ENCOUNTER — Encounter: Payer: Self-pay | Admitting: Physician Assistant

## 2024-05-14 VITALS — BP 128/82 | Temp 99.2°F | Ht 69.0 in | Wt 325.0 lb

## 2024-05-14 DIAGNOSIS — M5416 Radiculopathy, lumbar region: Secondary | ICD-10-CM

## 2024-05-14 DIAGNOSIS — Z09 Encounter for follow-up examination after completed treatment for conditions other than malignant neoplasm: Secondary | ICD-10-CM

## 2024-05-14 DIAGNOSIS — M5106 Intervertebral disc disorders with myelopathy, lumbar region: Secondary | ICD-10-CM

## 2024-05-14 NOTE — Progress Notes (Signed)
   REFERRING PHYSICIAN:  [facility] [address] [place],  [place] [address]  DOS: 04/29/2024, left L5-S1 microdiscectomy  HISTORY OF PRESENT ILLNESS: Karen Hunt is approximately 2 weeks status post left L5-S1 microdiscectomy. she is doing well.  She did have 1 instance where she had to help her grandpa who had fallen and since then has had some tingling in her left leg however she denies any pain at this time.  She does not have any sharp shooting pain down her leg since surgery.  She is taking Tylenol , but not using any other medications at this time and has been pleased with her progress.    PHYSICAL EXAMINATION:  General: Patient is well developed, well nourished, calm, collected, and in no apparent distress.   NEUROLOGICAL:  General: In no acute distress.   Awake, alert, oriented to person, place, and time.  Pupils equal round and reactive to light.  Facial tone is symmetric.     Strength:            Side Iliopsoas Quads Hamstring PF DF EHL  R 5 5 5 5 5 5   L 5 5 5 5 5 5    Incision c/d/i   ROS (Neurologic):  Negative except as noted above  IMAGING: No new imaging prior to this appointment.  ASSESSMENT/PLAN:  Karen Hunt is doing well approximately 2 weeks after left L5-S1 microdiscectomy.  She is only taking Tylenol  at this point.  She has some new left leg tingling after helping her grandfather after a fall.  We  discussed limitations and the importance of limiting bending, lifting, and twisting.I have advised the patient to lift up to 10 pounds until 6 weeks after surgery, then increase up to 25 pounds until 12 weeks after surgery.  After 12 weeks post-op, the patient advised to increase activity as tolerated.  Plan to see back in approximately 1 month for 6-week follow-up.  Advised to contact the office if any questions or concerns arise.  Ludwig Safer PA-C Department of neurosurgery

## 2024-05-17 ENCOUNTER — Encounter: Payer: Self-pay | Admitting: Neurosurgery

## 2024-05-24 ENCOUNTER — Encounter: Payer: Self-pay | Admitting: Neurosurgery

## 2024-05-30 DIAGNOSIS — M5126 Other intervertebral disc displacement, lumbar region: Secondary | ICD-10-CM

## 2024-05-30 HISTORY — DX: Other intervertebral disc displacement, lumbar region: M51.26

## 2024-06-04 ENCOUNTER — Ambulatory Visit: Admitting: Neurosurgery

## 2024-06-04 ENCOUNTER — Encounter: Payer: Self-pay | Admitting: Neurosurgery

## 2024-06-04 VITALS — BP 142/96 | Temp 98.1°F | Ht 69.0 in | Wt 325.0 lb

## 2024-06-04 DIAGNOSIS — Z09 Encounter for follow-up examination after completed treatment for conditions other than malignant neoplasm: Secondary | ICD-10-CM

## 2024-06-04 DIAGNOSIS — M5106 Intervertebral disc disorders with myelopathy, lumbar region: Secondary | ICD-10-CM

## 2024-06-04 DIAGNOSIS — M5416 Radiculopathy, lumbar region: Secondary | ICD-10-CM

## 2024-06-04 MED ORDER — METHYLPREDNISOLONE 4 MG PO TBPK: ORAL_TABLET | ORAL | 0 refills | Status: DC

## 2024-06-04 NOTE — Progress Notes (Signed)
 Had a follow-up clinic visit today with Karen Hunt.  She was doing very well until unfortunately tried to grab her grandfather while he was falling and "threw out her back".  She does have some radicular pain, it is not as severe as it was preoperatively, and she is not having any weakness as she was preoperatively thankfully.  She is here today in follow-up and has no new deficits.  She does have significant discomfort however, likely has a radiculitis and irritation to her facet joints.  Will plan on a dose of Solu-Medrol  and to continue to follow-up with her.  She does have a appointment for SI joint injections which she can continue to go forward with.  Will continue to keep a close eye on her and follow in clinic.   Carroll Clamp, MD

## 2024-06-05 ENCOUNTER — Encounter: Payer: Self-pay | Admitting: Neurosurgery

## 2024-06-09 ENCOUNTER — Encounter: Admitting: Neurosurgery

## 2024-06-16 ENCOUNTER — Encounter: Payer: Self-pay | Admitting: Neurosurgery

## 2024-06-16 DIAGNOSIS — M5416 Radiculopathy, lumbar region: Secondary | ICD-10-CM

## 2024-06-16 DIAGNOSIS — M5106 Intervertebral disc disorders with myelopathy, lumbar region: Secondary | ICD-10-CM

## 2024-06-18 ENCOUNTER — Other Ambulatory Visit: Payer: Self-pay | Admitting: Physician Assistant

## 2024-06-18 DIAGNOSIS — M5416 Radiculopathy, lumbar region: Secondary | ICD-10-CM

## 2024-06-18 DIAGNOSIS — M5106 Intervertebral disc disorders with myelopathy, lumbar region: Secondary | ICD-10-CM

## 2024-06-18 MED ORDER — CYCLOBENZAPRINE HCL 10 MG PO TABS: 10.0000 mg | ORAL_TABLET | Freq: Three times a day (TID) | ORAL | 0 refills | Status: DC | PRN

## 2024-06-18 MED ORDER — DIAZEPAM 2 MG PO TABS: 2.0000 mg | ORAL_TABLET | Freq: Once | ORAL | 0 refills | Status: AC

## 2024-06-18 NOTE — Telephone Encounter (Signed)
 Can you send in valium  for her MRI. Ordered to be done next week.

## 2024-06-27 ENCOUNTER — Ambulatory Visit
Admission: RE | Admit: 2024-06-27 | Discharge: 2024-06-27 | Disposition: A | Discharge status: Home / Self Care | Source: Ambulatory Visit | Attending: Neurosurgery | Admitting: Neurosurgery

## 2024-06-27 DIAGNOSIS — M5106 Intervertebral disc disorders with myelopathy, lumbar region: Secondary | ICD-10-CM | POA: Insufficient documentation

## 2024-06-27 DIAGNOSIS — M5416 Radiculopathy, lumbar region: Secondary | ICD-10-CM | POA: Insufficient documentation

## 2024-06-29 NOTE — Telephone Encounter (Signed)
 Please add patient to the waiting list.

## 2024-06-30 ENCOUNTER — Ambulatory Visit: Payer: Self-pay | Admitting: Neurosurgery

## 2024-07-01 ENCOUNTER — Other Ambulatory Visit: Payer: Self-pay

## 2024-07-01 ENCOUNTER — Encounter: Payer: Self-pay | Admitting: Neurosurgery

## 2024-07-01 ENCOUNTER — Ambulatory Visit (INDEPENDENT_AMBULATORY_CARE_PROVIDER_SITE_OTHER): Admitting: Neurosurgery

## 2024-07-01 VITALS — BP 122/80 | Ht 69.0 in | Wt 325.0 lb

## 2024-07-01 DIAGNOSIS — M5416 Radiculopathy, lumbar region: Secondary | ICD-10-CM

## 2024-07-01 DIAGNOSIS — Z09 Encounter for follow-up examination after completed treatment for conditions other than malignant neoplasm: Secondary | ICD-10-CM

## 2024-07-01 DIAGNOSIS — Z01818 Encounter for other preprocedural examination: Secondary | ICD-10-CM

## 2024-07-01 DIAGNOSIS — M5126 Other intervertebral disc displacement, lumbar region: Secondary | ICD-10-CM | POA: Insufficient documentation

## 2024-07-01 DIAGNOSIS — M5106 Intervertebral disc disorders with myelopathy, lumbar region: Secondary | ICD-10-CM

## 2024-07-01 NOTE — Progress Notes (Signed)
 REFERRING PHYSICIAN:  [facility] [address] [place],  [place] [address]  DOS: 04/29/2024, left L5-S1 microdiscectomy  HISTORY OF PRESENT ILLNESS: Karen Hunt is approximately 8 weeks status post left L5-S1 microdiscectomy.  She was doing very well with complete resolution of her pain and weakness until unfortunately she went to grab for her grandfather as he was falling and had an acute exacerbation of her back pain.  She felt acute worsening of her lower extremity pain numbness tingling.  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  She has not had any improvement.  She has tried a Medrol  Dosepak.  She continues to feel worsening numbness tingling and weakness in her lower extremity.  She has noticed tripping and falling over her left leg.  Cannot go up stairs as she does not have any strength in her calf.   PHYSICAL EXAMINATION:  General: Patient is well developed, well nourished, calm, collected, and in no apparent distress.   NEUROLOGICAL:  General: In no acute distress.   Awake, alert, oriented to person, place, and time.  Pupils equal round and reactive to light.  Facial tone is symmetric.     Strength:            Side Iliopsoas Quads Hamstring PF DF EHL  R 5 5 5 5 5 5   L 5 5 5  2* 4 4  Patient unable to lift her body weight with her left foot plantarflexion.  Decree sensation in the left posterior thigh down to the foot  ROS (Neurologic):  Negative except as noted above  IMAGING: Narrative & Impression  EXAM DESCRIPTION: MR LUMBAR SPINE WO CONTRAST   CLINICAL HISTORY: Lumbar radiculopathy, trauma   COMPARISON: None Available.   TECHNIQUE: MRI of the lumbar spine is performed according to our usual protocol with axial and sagittal multi sequence imaging.   FINDINGS: The alignment is unremarkable. Mild degenerative disc disease L5-S1. No endplate marrow edema. The marrow signal  is unremarkable as well as the conus and cauda equina.   T12-L1 has a broad-based disc bulge without stenosis.   L5-S1 has a probable prominent central/left paracentral disc extrusion. A component of this could reflect postoperative scar tissue. There is likely some new extruded disc material. This migrates cranially. This causes moderate to severe bilateral lateral recess narrowing and at least moderate central stenosis. Contact and suspected impingement of at least the bilateral S1 nerves greater on the left. Prior left-sided laminotomy defect.   The image levels are otherwise unremarkable.   The imaged retroperitoneal structures are unremarkable. Absent right kidney.     IMPRESSION: L5-S1 has a moderate area isointense to disc which is probably largely a recurrent disc extrusion. A component of this could reflect scar/granulation tissue given the recent surgery. There is concern for impingement of at least the bilateral S1 nerves greater on the left. There is a left-sided laminotomy defect. Further imaging to include contrasted sequences suggested to more definitively assess.   Electronically signed by: Reyes Frees MD 06/27/2024 07:28 PM EDT RP Workstation: [id number]    ASSESSMENT/PLAN:  Karen Hunt is doing well approximately 8 weeks after left L5-S1 microdiscectomy.  She was initially doing very well with complete resolution of her pain numbness and weakness.  She was back to increasing her activity.  Unfortunately her grandfather was about to have a fall and she reflexively went to catch him, as she caught her grandfather she  felt excruciating pain in her back and down the back of her leg.  This was very similar to her previous exacerbation.  Over the course of time she developed worsening numbness and weakness.  We obtained an MRI which demonstrated a recurrent disc herniation causing repeat compression of her left S1 nerve root.  On physical examination she has  decreased Achilles reflex, significant weakness in plantarflexion, unable to lift her own body weight.  She is able to easily do this on the contralateral side.  She has loss of sensation in her S1 distribution.  She also has positive straight leg raise.  Given the imaging findings and weakness now recurrent she does have indication for redo microdiscectomy.  We did discuss that given her severity of her weakness would recommend surgery.  Her pain is intractable.  Will plan for a redo exploration with disc decompression.  We did discuss that she is at higher risk this time for a CSF leak.  I also let her know that I generally will let patients have 1 recurrence before discussing a spinal fusion but did tell her that some spine surgeons will do fusion procedures after the first recurrence, I do not favor this practice especially given her age.  Karen MICAEL Sharps, MD Department of neurosurgery

## 2024-07-01 NOTE — H&P (View-Only) (Signed)
 REFERRING PHYSICIAN:  [facility] [address] [place],  [place] [address]  DOS: 04/29/2024, left L5-S1 microdiscectomy  HISTORY OF PRESENT ILLNESS: JOVONDA SELNER is approximately 8 weeks status post left L5-S1 microdiscectomy.  She was doing very well with complete resolution of her pain and weakness until unfortunately she went to grab for her grandfather as he was falling and had an acute exacerbation of her back pain.  She felt acute worsening of her lower extremity pain numbness tingling.  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  She has not had any improvement.  She has tried a Medrol  Dosepak.  She continues to feel worsening numbness tingling and weakness in her lower extremity.  She has noticed tripping and falling over her left leg.  Cannot go up stairs as she does not have any strength in her calf.   PHYSICAL EXAMINATION:  General: Patient is well developed, well nourished, calm, collected, and in no apparent distress.   NEUROLOGICAL:  General: In no acute distress.   Awake, alert, oriented to person, place, and time.  Pupils equal round and reactive to light.  Facial tone is symmetric.     Strength:            Side Iliopsoas Quads Hamstring PF DF EHL  R 5 5 5 5 5 5   L 5 5 5  2* 4 4  Patient unable to lift her body weight with her left foot plantarflexion.  Decree sensation in the left posterior thigh down to the foot  ROS (Neurologic):  Negative except as noted above  IMAGING: Narrative & Impression  EXAM DESCRIPTION: MR LUMBAR SPINE WO CONTRAST   CLINICAL HISTORY: Lumbar radiculopathy, trauma   COMPARISON: None Available.   TECHNIQUE: MRI of the lumbar spine is performed according to our usual protocol with axial and sagittal multi sequence imaging.   FINDINGS: The alignment is unremarkable. Mild degenerative disc disease L5-S1. No endplate marrow edema. The marrow signal  is unremarkable as well as the conus and cauda equina.   T12-L1 has a broad-based disc bulge without stenosis.   L5-S1 has a probable prominent central/left paracentral disc extrusion. A component of this could reflect postoperative scar tissue. There is likely some new extruded disc material. This migrates cranially. This causes moderate to severe bilateral lateral recess narrowing and at least moderate central stenosis. Contact and suspected impingement of at least the bilateral S1 nerves greater on the left. Prior left-sided laminotomy defect.   The image levels are otherwise unremarkable.   The imaged retroperitoneal structures are unremarkable. Absent right kidney.     IMPRESSION: L5-S1 has a moderate area isointense to disc which is probably largely a recurrent disc extrusion. A component of this could reflect scar/granulation tissue given the recent surgery. There is concern for impingement of at least the bilateral S1 nerves greater on the left. There is a left-sided laminotomy defect. Further imaging to include contrasted sequences suggested to more definitively assess.   Electronically signed by: Reyes Frees MD 06/27/2024 07:28 PM EDT RP Workstation: [id number]    ASSESSMENT/PLAN:  Charmaine CHRISTELLA Ned is doing well approximately 8 weeks after left L5-S1 microdiscectomy.  She was initially doing very well with complete resolution of her pain numbness and weakness.  She was back to increasing her activity.  Unfortunately her grandfather was about to have a fall and she reflexively went to catch him, as she caught her grandfather she  felt excruciating pain in her back and down the back of her leg.  This was very similar to her previous exacerbation.  Over the course of time she developed worsening numbness and weakness.  We obtained an MRI which demonstrated a recurrent disc herniation causing repeat compression of her left S1 nerve root.  On physical examination she has  decreased Achilles reflex, significant weakness in plantarflexion, unable to lift her own body weight.  She is able to easily do this on the contralateral side.  She has loss of sensation in her S1 distribution.  She also has positive straight leg raise.  Given the imaging findings and weakness now recurrent she does have indication for redo microdiscectomy.  We did discuss that given her severity of her weakness would recommend surgery.  Her pain is intractable.  Will plan for a redo exploration with disc decompression.  We did discuss that she is at higher risk this time for a CSF leak.  I also let her know that I generally will let patients have 1 recurrence before discussing a spinal fusion but did tell her that some spine surgeons will do fusion procedures after the first recurrence, I do not favor this practice especially given her age.  Penne MICAEL Sharps, MD Department of neurosurgery

## 2024-07-01 NOTE — Patient Instructions (Addendum)
 Please see below for information in regards to your upcoming surgery:   Planned surgery: Redo left L5-S1 discectomy   Surgery date: 07/08/24 at [facility]: [address], [place], [place] [address]) - you will find out your arrival time the business day before your surgery.   Pre-op appointment at [facility] Pre-admit Testing: you will receive a call with a date/time for this appointment. If you are scheduled for an in person appointment, Pre-admit Testing is located on the first floor of the [facility] building, [address], [address]. During this appointment, they will advise you which medications you can take the morning of surgery, and which medications you will need to hold for surgery. Labs (such as blood work, EKG) may be done at your pre-op appointment. You are not required to fast for these labs. Should you need to change your pre-op appointment, please call Pre-admit testing at [phone].      Diabetes/weight loss medications: Per anesthesia guidelines (due to the increased risk of aspiration caused by delayed gastric emptying):   Metformin: hold for 2 days prior to surgery    Common restrictions after surgery: No bending, lifting, or twisting ("BLT"). Avoid lifting objects heavier than 10 pounds for the first 6 weeks after surgery. Where possible, avoid household activities that involve lifting, bending, reaching, pushing, or pulling such as laundry, vacuuming, grocery shopping, and childcare. Try to arrange for help from friends and family for these activities while you heal. Do not drive while taking prescription pain medication. Weeks 6 through 12 after surgery: avoid lifting more than 25 pounds.     How to contact us :  If you have any questions/concerns before or after surgery, you can reach us  at [phone], or you can send a mychart message. We can be reached by phone or mychart 8am-4pm, Monday-Friday.   *Please note: Calls after 4pm are forwarded to a third party answering service. Mychart messages are not routinely monitored during evenings, weekends, and holidays. Please call our office to contact the answering service for urgent concerns during non-business hours.   If you have FMLA/disability paperwork, please drop it off or fax it to [phone], attention Patty.   Appointments/FMLA & disability paperwork: Odetta Mora, & Ritta Registered Nurses/Surgery schedulers: Roselia Snipe & Lauren Medical Assistants: Damien ODESSIA Sailors Physician Assistants: Lyle Decamp, PA-C, Edsel Goods, PA-C & Glade Boys, PA-C Surgeons: Reeves Daisy, MD & Penne Sharps, MD   [facility] PREADMIT TESTING VISIT and SURGERY INFORMATION SHEET   Now that surgery has been scheduled you can anticipate several phone calls from [facility] services. A pharmacy technician will call you to verify your current list of medications taken at home.               The Pre-Service Center will call to verify your insurance information and to give you billing estimates and information.             The Preadmit Testing Office will be calling to schedule a visit to obtain information for the anesthesia team and provide instructions on preparation for surgery.  What can you expect for the Preadmit Testing Visit: Appointments may be scheduled in-person or by telephone.  If a telephone visit is scheduled, you may be asked to come into the office to have lab tests or other studies performed.   This visit will not be completed any greater than 14 days prior to your surgery.  If your surgery has been  scheduled for a future date, please do not be alarmed if we have not contacted you to schedule an appointment more than a month prior to the surgery date.    Please be prepared to provide the following information during this appointment:            -Personal medical history                                                -Medication and allergy list            -Any history of problems with anesthesia              -Recent lab work or diagnostic studies            -Please notify us  of any needs we should be aware of to provide the best care possible           -You will be provided with instructions on how to prepare for your surgery.    On The Day of Surgery:  You must have a driver to take you home after surgery, you will be asked not to drive for 24 hours following surgery.  Taxi, Gisele and non-medical transport will not be acceptable means of transportation unless you have a responsible individual who will be traveling with you.  Visitors in the surgical area:   2 people will be able to visit you in your room once your preparation for surgery has been completed. During surgery, your visitors will be asked to wait in the Surgery Waiting Area.  It is not a requirement for them to stay, if they prefer to leave and come back.  Your visitor(s) will be given an update once the surgery has been completed.  No visitors are allowed in the initial recovery room to respect patient privacy and safety.  Once you are more awake and transfer to the secondary recovery area, or are transferred to an inpatient room, visitors will again be able to see you.  To respect and protect your privacy: We will ask on the day of surgery who your driver will be and what the contact number for that individual will be. We will ask if it is okay to share information with this individual, or if there is an alternative individual that we, or the surgeon, should contact to provide updates and information. If family or friends come to the surgical information desk requesting information about you, who you have not listed with us , no information will be given.   It may be helpful to designate someone as the main contact who will be responsible for updating your other friends and family.    PREADMIT TESTING OFFICE: [phone] SAME DAY  SURGERY: [phone] We look forward to caring for you before and throughout the process of your surgery.

## 2024-07-07 ENCOUNTER — Encounter: Admitting: Physician Assistant

## 2024-07-07 ENCOUNTER — Other Ambulatory Visit: Payer: Self-pay

## 2024-07-07 ENCOUNTER — Encounter
Admission: RE | Admit: 2024-07-07 | Discharge: 2024-07-07 | Disposition: A | Discharge status: Home / Self Care | Source: Ambulatory Visit | Attending: Neurosurgery | Admitting: Neurosurgery

## 2024-07-07 DIAGNOSIS — R9431 Abnormal electrocardiogram [ECG] [EKG]: Secondary | ICD-10-CM | POA: Diagnosis not present

## 2024-07-07 DIAGNOSIS — Z6841 Body Mass Index (BMI) 40.0 and over, adult: Secondary | ICD-10-CM | POA: Diagnosis not present

## 2024-07-07 DIAGNOSIS — I69398 Other sequelae of cerebral infarction: Secondary | ICD-10-CM | POA: Diagnosis not present

## 2024-07-07 DIAGNOSIS — E66813 Obesity, class 3: Secondary | ICD-10-CM | POA: Diagnosis not present

## 2024-07-07 DIAGNOSIS — G40909 Epilepsy, unspecified, not intractable, without status epilepticus: Secondary | ICD-10-CM | POA: Diagnosis not present

## 2024-07-07 DIAGNOSIS — Z01812 Encounter for preprocedural laboratory examination: Secondary | ICD-10-CM

## 2024-07-07 DIAGNOSIS — Z01818 Encounter for other preprocedural examination: Secondary | ICD-10-CM | POA: Insufficient documentation

## 2024-07-07 DIAGNOSIS — I10 Essential (primary) hypertension: Secondary | ICD-10-CM | POA: Insufficient documentation

## 2024-07-07 DIAGNOSIS — Z8673 Personal history of transient ischemic attack (TIA), and cerebral infarction without residual deficits: Secondary | ICD-10-CM

## 2024-07-07 HISTORY — DX: Sleep apnea, unspecified: G47.30

## 2024-07-07 LAB — URINALYSIS, COMPLETE (UACMP) WITH MICROSCOPIC
Bilirubin Urine: NEGATIVE
Glucose, UA: NEGATIVE mg/dL
Hgb urine dipstick: NEGATIVE
Ketones, ur: NEGATIVE mg/dL
Leukocytes,Ua: NEGATIVE
Nitrite: NEGATIVE
Protein, ur: NEGATIVE mg/dL
Specific Gravity, Urine: 1.018 (ref 1.005–1.030)
pH: 5 (ref 5.0–8.0)

## 2024-07-07 LAB — BASIC METABOLIC PANEL WITH GFR
Anion gap: 7 (ref 5–15)
BUN: 10 mg/dL (ref 6–20)
CO2: 24 mmol/L (ref 22–32)
Calcium: 9.1 mg/dL (ref 8.9–10.3)
Chloride: 109 mmol/L (ref 98–111)
Creatinine, Ser: 0.91 mg/dL (ref 0.44–1.00)
GFR, Estimated: >60 mL/min (ref >60)
Glucose, Bld: 89 mg/dL (ref 70–99)
Potassium: 3.6 mmol/L (ref 3.5–5.1)
Sodium: 140 mmol/L (ref 135–145)

## 2024-07-07 LAB — CBC
HCT: 42.8 % (ref 36.0–46.0)
Hemoglobin: 14.3 g/dL (ref 12.0–15.0)
MCH: 30.1 pg (ref 26.0–34.0)
MCHC: 33.4 g/dL (ref 30.0–36.0)
MCV: 90.1 fL (ref 80.0–100.0)
Platelets: 254 K/uL (ref 150–400)
RBC: 4.75 MIL/uL (ref 3.87–5.11)
RDW: 13.2 % (ref 11.5–15.5)
WBC: 6.5 K/uL (ref 4.0–10.5)
nRBC: 0 % (ref 0.0–0.2)

## 2024-07-07 LAB — TYPE AND SCREEN
ABO/RH(D): A POS
Antibody Screen: NEGATIVE

## 2024-07-07 LAB — SURGICAL PCR SCREEN
MRSA, PCR: NEGATIVE
Staphylococcus aureus: NEGATIVE

## 2024-07-07 NOTE — Patient Instructions (Addendum)
 Your procedure is scheduled on: 07/08/24 - Thursday Report to the Registration Desk on the 1st floor of the [facility]. To find out your arrival time, please call [phone] between 1PM - 3PM on: 07/07/24 - Wednesday If your arrival time is 6:00 am, do not arrive before that time as the [facility] entrance doors do not open until 6:00 am.  REMEMBER: Instructions that are not followed completely may result in serious medical risk, up to and including death; or upon the discretion of your surgeon and anesthesiologist your surgery may need to be rescheduled.  Do not eat food after midnight the night before surgery.  No gum chewing or hard candies.  You may however, drink CLEAR liquids up to 2 hours before you are scheduled to arrive for your surgery. Do not drink anything within 2 hours of your scheduled arrival time.  Clear liquids include: - water  - apple juice without pulp - gatorade (not RED colors) - black coffee or tea (Do NOT add milk or creamers to the coffee or tea) Do NOT drink anything that is not on this list.   You may continue these medications as needed, Anti-inflammatories (NSAIDS) such as Advil , Aleve , Ibuprofen , Motrin , Naproxen , Naprosyn  and Aspirin based products such as Excedrin, Goody's Powder, BC Powder. You may take Tylenol  if needed for pain up until the day of surgery.  Stop ANY OVER THE COUNTER supplements until after surgery.  Metformin: hold for 2 days prior to surgery   ON THE DAY OF SURGERY ONLY TAKE THESE MEDICATIONS WITH SIPS OF WATER:  escitalopram (LEXAPRO)  famotidine  (PEPCID )   Use inhalers on the day of surgery and bring to the hospital.   No Alcohol for 24 hours before or after surgery.  No Smoking including e-cigarettes for 24 hours before surgery.  No chewable tobacco products for at least 6 hours before surgery.  No nicotine patches on the day of surgery.  Do not use any recreational drugs for at least a week (preferably 2  weeks) before your surgery.  Please be advised that the combination of cocaine and anesthesia may have negative outcomes, up to and including death. If you test positive for cocaine, your surgery will be cancelled.  On the morning of surgery brush your teeth with toothpaste and water, you may rinse your mouth with mouthwash if you wish. Do not swallow any toothpaste or mouthwash.  Use CHG Soap or wipes as directed on instruction sheet.  Do not wear jewelry, make-up, hairpins, clips or nail polish.  For welded (permanent) jewelry: bracelets, anklets, waist bands, etc.  Please have this removed prior to surgery.  If it is not removed, there is a chance that hospital personnel will need to cut it off on the day of surgery.  Do not wear lotions, powders, or perfumes.   Do not shave body hair from the neck down 48 hours before surgery.  Contact lenses, hearing aids and dentures may not be worn into surgery.  Do not bring valuables to the hospital. [facility] is not responsible for any missing/lost belongings or valuables.   Notify your doctor if there is any change in your medical condition (cold, fever, infection).  Wear comfortable clothing (specific to your surgery type) to the hospital.  After surgery, you can help prevent lung complications by doing breathing exercises.  Take deep breaths and cough every 1-2 hours. Your doctor may order a device called an Incentive Spirometer to help you take deep breaths.  When  coughing or sneezing, hold a pillow firmly against your incision with both hands. This is called "splinting." Doing this helps protect your incision. It also decreases belly discomfort.  If you are being admitted to the hospital overnight, leave your suitcase in the car. After surgery it may be brought to your room.  In case of increased patient census, it may be necessary for you, the patient, to continue your postoperative care in the Same Day Surgery department.  If you  are being discharged the day of surgery, you will not be allowed to drive home. You will need a responsible individual to drive you home and stay with you for 24 hours after surgery.   If you are taking public transportation, you will need to have a responsible individual with you.  Please call the Pre-admissions Testing Dept. at [phone] if you have any questions about these instructions.  Surgery Visitation Policy:  Patients having surgery or a procedure may have two visitors.  Children under the age of 105 must have an adult with them who is not the patient.  Inpatient Visitation:    Visiting hours are 7 a.m. to 8 p.m. Up to four visitors are allowed at one time in a patient room. The visitors may rotate out with other people during the day.  One visitor age 33 or older may stay with the patient overnight and must be in the room by 8 p.m.   Merchandiser, retail to address health-related social needs:  [email or web address]     Preparing for Surgery with CHLORHEXIDINE  GLUCONATE (CHG) Soap  Chlorhexidine  Gluconate (CHG) Soap  o An antiseptic cleaner that kills germs and bonds with the skin to continue killing germs even after washing  o Used for showering the night before surgery and morning of surgery  Before surgery, you can play an important role by reducing the number of germs on your skin.  CHG (Chlorhexidine  gluconate) soap is an antiseptic cleanser which kills germs and bonds with the skin to continue killing germs even after washing.  Please do not use if you have an allergy to CHG or antibacterial soaps. If your skin becomes reddened/irritated stop using the CHG.  1. Shower the NIGHT BEFORE SURGERY and the MORNING OF SURGERY with CHG soap.  2. If you choose to wash your hair, wash your hair first as usual with your normal shampoo.  3. After shampooing, rinse your hair and body thoroughly to remove the shampoo.  4. Use CHG as you would any  other liquid soap. You can apply CHG directly to the skin and wash gently with a scrungie or a clean washcloth.  5. Apply the CHG soap to your body only from the neck down. Do not use on open wounds or open sores. Avoid contact with your eyes, ears, mouth, and genitals (private parts). Wash face and genitals (private parts) with your normal soap.  6. Wash thoroughly, paying special attention to the area where your surgery will be performed.  7. Thoroughly rinse your body with warm water.  8. Do not shower/wash with your normal soap after using and rinsing off the CHG soap.  9. Pat yourself dry with a clean towel.  10. Wear clean pajamas to bed the night before surgery.  12. Place clean sheets on your bed the night of your first shower and do not sleep with pets.  13. Shower again with the CHG soap on the day of surgery prior to arriving at the hospital.  14. Do  not apply any deodorants/lotions/powders.  15. Please wear clean clothes to the hospital.

## 2024-07-08 ENCOUNTER — Other Ambulatory Visit: Payer: Self-pay

## 2024-07-08 ENCOUNTER — Ambulatory Visit
Admission: RE | Admit: 2024-07-08 | Discharge: 2024-07-08 | Disposition: A | Discharge status: Home / Self Care | Attending: Neurosurgery | Admitting: Neurosurgery

## 2024-07-08 ENCOUNTER — Ambulatory Visit

## 2024-07-08 ENCOUNTER — Ambulatory Visit: Payer: Self-pay | Admitting: Urgent Care

## 2024-07-08 ENCOUNTER — Encounter
Admission: RE | Disposition: A | Discharge status: Home / Self Care | Payer: Self-pay | Source: Home / Self Care | Attending: Neurosurgery

## 2024-07-08 DIAGNOSIS — Z01812 Encounter for preprocedural laboratory examination: Secondary | ICD-10-CM

## 2024-07-08 DIAGNOSIS — F419 Anxiety disorder, unspecified: Secondary | ICD-10-CM | POA: Diagnosis not present

## 2024-07-08 DIAGNOSIS — K219 Gastro-esophageal reflux disease without esophagitis: Secondary | ICD-10-CM | POA: Diagnosis not present

## 2024-07-08 DIAGNOSIS — M5106 Intervertebral disc disorders with myelopathy, lumbar region: Secondary | ICD-10-CM | POA: Insufficient documentation

## 2024-07-08 DIAGNOSIS — F32A Depression, unspecified: Secondary | ICD-10-CM | POA: Diagnosis not present

## 2024-07-08 DIAGNOSIS — R519 Headache, unspecified: Secondary | ICD-10-CM | POA: Insufficient documentation

## 2024-07-08 DIAGNOSIS — M5117 Intervertebral disc disorders with radiculopathy, lumbosacral region: Secondary | ICD-10-CM | POA: Insufficient documentation

## 2024-07-08 DIAGNOSIS — M5126 Other intervertebral disc displacement, lumbar region: Secondary | ICD-10-CM

## 2024-07-08 DIAGNOSIS — X500XXA Overexertion from strenuous movement or load, initial encounter: Secondary | ICD-10-CM | POA: Diagnosis not present

## 2024-07-08 DIAGNOSIS — R569 Unspecified convulsions: Secondary | ICD-10-CM | POA: Insufficient documentation

## 2024-07-08 DIAGNOSIS — I10 Essential (primary) hypertension: Secondary | ICD-10-CM | POA: Diagnosis not present

## 2024-07-08 DIAGNOSIS — Z9889 Other specified postprocedural states: Secondary | ICD-10-CM | POA: Insufficient documentation

## 2024-07-08 DIAGNOSIS — E66813 Obesity, class 3: Secondary | ICD-10-CM | POA: Diagnosis not present

## 2024-07-08 DIAGNOSIS — M5416 Radiculopathy, lumbar region: Secondary | ICD-10-CM

## 2024-07-08 DIAGNOSIS — Z6841 Body Mass Index (BMI) 40.0 and over, adult: Secondary | ICD-10-CM | POA: Insufficient documentation

## 2024-07-08 DIAGNOSIS — J45909 Unspecified asthma, uncomplicated: Secondary | ICD-10-CM | POA: Insufficient documentation

## 2024-07-08 DIAGNOSIS — Z01818 Encounter for other preprocedural examination: Secondary | ICD-10-CM

## 2024-07-08 DIAGNOSIS — F17200 Nicotine dependence, unspecified, uncomplicated: Secondary | ICD-10-CM | POA: Diagnosis not present

## 2024-07-08 SURGERY — LUMBAR LAMINECTOMY/DECOMPRESSION MICRODISCECTOMY 1 LEVEL
Anesthesia: General | Laterality: Left

## 2024-07-08 MED ORDER — OXYCODONE HCL 5 MG/5ML PO SOLN: 5.0000 mg | Freq: Once | ORAL | Status: AC | PRN

## 2024-07-08 MED ORDER — SODIUM CHLORIDE (PF) 0.9 % IJ SOLN
INTRAMUSCULAR | Status: DC | PRN
  Administered 2024-07-08: 30 mL

## 2024-07-08 MED ORDER — OXYCODONE HCL 5 MG PO TABS: 5.0000 mg | ORAL_TABLET | ORAL | 0 refills | Status: DC | PRN

## 2024-07-08 MED ORDER — FENTANYL CITRATE (PF) 100 MCG/2ML IJ SOLN
25.0000 ug | INTRAMUSCULAR | Status: DC | PRN
  Administered 2024-07-08 (×2): 50 ug via INTRAVENOUS

## 2024-07-08 MED ORDER — MIDAZOLAM HCL 2 MG/2ML IJ SOLN
INTRAMUSCULAR | Status: AC
  Filled 2024-07-08: qty 2

## 2024-07-08 MED ORDER — CEFAZOLIN SODIUM-DEXTROSE 2-4 GM/100ML-% IV SOLN
3.0000 g | Freq: Once | INTRAVENOUS | Status: AC
  Administered 2024-07-08: 3 g via INTRAVENOUS

## 2024-07-08 MED ORDER — PROPOFOL 10 MG/ML IV BOLUS
INTRAVENOUS | Status: DC | PRN
  Administered 2024-07-08: 200 mg via INTRAVENOUS

## 2024-07-08 MED ORDER — GLYCOPYRROLATE 0.2 MG/ML IJ SOLN
INTRAMUSCULAR | Status: DC | PRN
  Administered 2024-07-08: .2 mg via INTRAVENOUS

## 2024-07-08 MED ORDER — ACETAMINOPHEN 500 MG PO TABS: 1000.0000 mg | ORAL_TABLET | Freq: Four times a day (QID) | ORAL | 0 refills | Status: AC | PRN

## 2024-07-08 MED ORDER — SENNA 8.6 MG PO TABS: 1.0000 | ORAL_TABLET | Freq: Every day | ORAL | 0 refills | Status: DC

## 2024-07-08 MED ORDER — BUPIVACAINE-EPINEPHRINE (PF) 0.5% -1:200000 IJ SOLN
INTRAMUSCULAR | Status: DC | PRN
  Administered 2024-07-08: 10 mL

## 2024-07-08 MED ORDER — SUGAMMADEX SODIUM 500 MG/5ML IV SOLN
INTRAVENOUS | Status: DC | PRN
  Administered 2024-07-08: 400 mg via INTRAVENOUS

## 2024-07-08 MED ORDER — FENTANYL CITRATE (PF) 100 MCG/2ML IJ SOLN
INTRAMUSCULAR | Status: AC
Start: 2024-07-08 — End: 2024-07-08
  Filled 2024-07-08: qty 2

## 2024-07-08 MED ORDER — ORAL CARE MOUTH RINSE: 15.0000 mL | Freq: Once | OROMUCOSAL | Status: AC

## 2024-07-08 MED ORDER — ONDANSETRON HCL 4 MG/2ML IJ SOLN
INTRAMUSCULAR | Status: DC | PRN
  Administered 2024-07-08: 4 mg via INTRAVENOUS

## 2024-07-08 MED ORDER — METHYLPREDNISOLONE ACETATE 40 MG/ML IJ SUSP
INTRAMUSCULAR | Status: DC | PRN
  Administered 2024-07-08: 40 mg

## 2024-07-08 MED ORDER — PROPOFOL 10 MG/ML IV BOLUS
INTRAVENOUS | Status: AC
  Filled 2024-07-08: qty 20

## 2024-07-08 MED ORDER — LIDOCAINE HCL (CARDIAC) PF 100 MG/5ML IV SOSY
PREFILLED_SYRINGE | INTRAVENOUS | Status: DC | PRN
  Administered 2024-07-08: 100 mg via INTRAVENOUS

## 2024-07-08 MED ORDER — FENTANYL CITRATE (PF) 100 MCG/2ML IJ SOLN
INTRAMUSCULAR | Status: AC
  Filled 2024-07-08: qty 2

## 2024-07-08 MED ORDER — ROCURONIUM BROMIDE 100 MG/10ML IV SOLN
INTRAVENOUS | Status: DC | PRN
  Administered 2024-07-08: 10 mg via INTRAVENOUS
  Administered 2024-07-08: 80 mg via INTRAVENOUS
  Administered 2024-07-08: 10 mg via INTRAVENOUS

## 2024-07-08 MED ORDER — CHLORHEXIDINE GLUCONATE 0.12 % MT SOLN
15.0000 mL | Freq: Once | OROMUCOSAL | Status: AC
  Administered 2024-07-08: 15 mL via OROMUCOSAL

## 2024-07-08 MED ORDER — HYDROMORPHONE HCL 1 MG/ML IJ SOLN
INTRAMUSCULAR | Status: DC | PRN
  Administered 2024-07-08: .4 mg via INTRAVENOUS
  Administered 2024-07-08: .2 mg via INTRAVENOUS
  Administered 2024-07-08: .4 mg via INTRAVENOUS

## 2024-07-08 MED ORDER — HYDROMORPHONE HCL 1 MG/ML IJ SOLN
INTRAMUSCULAR | Status: AC
  Filled 2024-07-08: qty 1

## 2024-07-08 MED ORDER — CEFAZOLIN SODIUM-DEXTROSE 2-4 GM/100ML-% IV SOLN
INTRAVENOUS | Status: AC
  Filled 2024-07-08: qty 100

## 2024-07-08 MED ORDER — MIDAZOLAM HCL 2 MG/2ML IJ SOLN
INTRAMUSCULAR | Status: DC | PRN
  Administered 2024-07-08: 2 mg via INTRAVENOUS

## 2024-07-08 MED ORDER — SURGIFLO WITH THROMBIN (HEMOSTATIC MATRIX KIT) OPTIME
TOPICAL | Status: DC | PRN
  Administered 2024-07-08: 1 via TOPICAL

## 2024-07-08 MED ORDER — CHLORHEXIDINE GLUCONATE 0.12 % MT SOLN
OROMUCOSAL | Status: AC
  Filled 2024-07-08: qty 15

## 2024-07-08 MED ORDER — DEXAMETHASONE SODIUM PHOSPHATE 10 MG/ML IJ SOLN
INTRAMUSCULAR | Status: DC | PRN
  Administered 2024-07-08 (×2): 10 mg via INTRAVENOUS

## 2024-07-08 MED ORDER — LACTATED RINGERS IV SOLN: INTRAVENOUS | Status: DC

## 2024-07-08 MED ORDER — FENTANYL CITRATE (PF) 100 MCG/2ML IJ SOLN
INTRAMUSCULAR | Status: DC | PRN
  Administered 2024-07-08 (×2): 50 ug via INTRAVENOUS

## 2024-07-08 MED ORDER — DEXMEDETOMIDINE HCL IN NACL 200 MCG/50ML IV SOLN
INTRAVENOUS | Status: DC | PRN
  Administered 2024-07-08: 8 ug via INTRAVENOUS
  Administered 2024-07-08: 4 ug via INTRAVENOUS

## 2024-07-08 MED ORDER — OXYCODONE HCL 5 MG PO TABS
ORAL_TABLET | ORAL | Status: AC
  Filled 2024-07-08: qty 1

## 2024-07-08 MED ORDER — 0.9 % SODIUM CHLORIDE (POUR BTL) OPTIME
TOPICAL | Status: DC | PRN
  Administered 2024-07-08: 425 mL

## 2024-07-08 MED ORDER — DOCUSATE SODIUM 100 MG PO CAPS: 100.0000 mg | ORAL_CAPSULE | Freq: Two times a day (BID) | ORAL | 0 refills | Status: DC

## 2024-07-08 MED ORDER — OXYCODONE HCL 5 MG PO TABS
5.0000 mg | ORAL_TABLET | Freq: Once | ORAL | Status: AC | PRN
  Administered 2024-07-08: 5 mg via ORAL

## 2024-07-08 SURGICAL SUPPLY — 31 items
BASIN KIT SINGLE STR (MISCELLANEOUS) ×1 IMPLANT
BRUSH SCRUB EZ 4% CHG (MISCELLANEOUS) ×1 IMPLANT
BUR NEURO DRILL SOFT 3.0X3.8M (BURR) ×1 IMPLANT
DERMABOND ADVANCED .7 DNX12 (GAUZE/BANDAGES/DRESSINGS) ×1 IMPLANT
DRAPE C-ARM XRAY 36X54 (DRAPES) ×2 IMPLANT
DRAPE LAPAROTOMY 100X77 ABD (DRAPES) ×1 IMPLANT
DRAPE MICROSCOPE SPINE 48X150 (DRAPES) ×1 IMPLANT
DRSG OPSITE POSTOP 3X4 (GAUZE/BANDAGES/DRESSINGS) ×1 IMPLANT
DRSG TEGADERM 4X4.75 (GAUZE/BANDAGES/DRESSINGS) IMPLANT
ELECTRODE EZSTD 165MM 6.5IN (MISCELLANEOUS) ×1 IMPLANT
ELECTRODE REM PT RTRN 9FT ADLT (ELECTROSURGICAL) ×1 IMPLANT
GLOVE BIOGEL PI IND STRL 7.0 (GLOVE) ×1 IMPLANT
GLOVE BIOGEL PI IND STRL 8 (GLOVE) ×2 IMPLANT
GLOVE SURG SYN 7.0 (GLOVE) ×1 IMPLANT
GLOVE SURG SYN 7.0 PF PI (GLOVE) ×1 IMPLANT
GLOVE SURG SYN 7.5 E (GLOVE) ×1 IMPLANT
GLOVE SURG SYN 7.5 PF PI (GLOVE) ×1 IMPLANT
GOWN SRG XL LVL 3 NONREINFORCE (GOWNS) ×1 IMPLANT
GOWN STRL REUS W/ TWL LRG LVL3 (GOWN DISPOSABLE) ×1 IMPLANT
KIT WILSON FRAME (KITS) ×1 IMPLANT
NDL SAFETY ECLIPSE 18X1.5 (NEEDLE) ×1 IMPLANT
NS IRRIG 500ML POUR BTL (IV SOLUTION) ×1 IMPLANT
PACK LAMINECTOMY ARMC (PACKS) ×1 IMPLANT
PAD ARMBOARD POSITIONER FOAM (MISCELLANEOUS) ×2 IMPLANT
SURGIFLO W/THROMBIN 8M KIT (HEMOSTASIS) ×1 IMPLANT
SUT STRATA 3-0 15 PS-2 (SUTURE) ×1 IMPLANT
SUT VIC AB 0 CT1 27XCR 8 STRN (SUTURE) ×1 IMPLANT
SUT VIC AB 2-0 CT1 18 (SUTURE) ×1 IMPLANT
SYR 30ML LL (SYRINGE) ×2 IMPLANT
SYR 3ML LL SCALE MARK (SYRINGE) ×1 IMPLANT
TRAP FLUID SMOKE EVACUATOR (MISCELLANEOUS) ×1 IMPLANT

## 2024-07-08 NOTE — Anesthesia Postprocedure Evaluation (Signed)
 Anesthesia Post Note  Patient: Karen Hunt  Procedure(s) Performed: LUMBAR LAMINECTOMY/DECOMPRESSION MICRODISCECTOMY 1 LEVEL (Left)  Patient location during evaluation: PACU Anesthesia Type: General Level of consciousness: awake and alert Pain management: pain level controlled Vital Signs Assessment: post-procedure vital signs reviewed and stable Respiratory status: spontaneous breathing, nonlabored ventilation, respiratory function stable and patient connected to nasal cannula oxygen Cardiovascular status: blood pressure returned to baseline and stable Postop Assessment: no apparent nausea or vomiting Anesthetic complications: no   No notable events documented.   Last Vitals:  Vitals:   07/08/24 1330 07/08/24 1347  BP: 128/86 125/69  Pulse: 89 82  Resp: 12 18  Temp: (!) 36.2 C 36.7 C  SpO2: 94% 94%    Last Pain:  Vitals:   07/08/24 1347  TempSrc: Temporal  PainSc: 4                  [place]

## 2024-07-08 NOTE — Anesthesia Procedure Notes (Signed)
 Procedure Name: Intubation Date/Time: 07/08/2024 10:54 AM  Performed by: Viviana Lacks, RNPre-anesthesia Checklist: Patient identified, Patient being monitored, Timeout performed, Emergency Drugs available and Suction available Patient Re-evaluated:Patient Re-evaluated prior to induction Oxygen Delivery Method: Circle system utilized Preoxygenation: Pre-oxygenation with 100% oxygen Induction Type: IV induction Ventilation: Mask ventilation without difficulty Laryngoscope Size: 3 and McGrath Grade View: Grade I Tube type: Oral Tube size: 7.0 mm Number of attempts: 1 Airway Equipment and Method: Stylet Placement Confirmation: ETT inserted through vocal cords under direct vision, positive ETCO2 and breath sounds checked- equal and bilateral Secured at: 22 cm Tube secured with: Tape Dental Injury: Teeth and Oropharynx as per pre-operative assessment

## 2024-07-08 NOTE — Transfer of Care (Signed)
 Immediate Anesthesia Transfer of Care Note  Patient: Karen Hunt  Procedure(s) Performed: LUMBAR LAMINECTOMY/DECOMPRESSION MICRODISCECTOMY 1 LEVEL (Left)  Patient Location: PACU  Anesthesia Type:General  Level of Consciousness: awake and patient cooperative  Airway & Oxygen Therapy: Patient Spontanous Breathing  Post-op Assessment: Report given to RN and Post -op Vital signs reviewed and stable  Post vital signs: Reviewed and stable  Last Vitals:  Vitals Value Taken Time  BP 127/74 07/08/24 13:03  Temp    Pulse 88 07/08/24 13:06  Resp 14 07/08/24 13:06  SpO2 100 % 07/08/24 13:06  Vitals shown include unfiled device data.  Last Pain:  Vitals:   07/08/24 0841  TempSrc: Temporal         Complications: No notable events documented.

## 2024-07-08 NOTE — Interval H&P Note (Signed)
 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  The various methods of treatment have been discussed with the patient and family. After consideration of risks, benefits and other options for treatment, the patient has consented to  Procedure(s) with comments: LUMBAR LAMINECTOMY/DECOMPRESSION MICRODISCECTOMY 1 LEVEL (Left) - REDO LEFT L5-S1 DISCECTOMY as a surgical intervention.  The patient's history has been reviewed, patient examined, no change in status, stable for surgery.  I have reviewed the patient's chart and labs.  Questions were answered to the patient's satisfaction.    Heart and lungs clear    Penne LELON Sharps

## 2024-07-08 NOTE — Anesthesia Preprocedure Evaluation (Signed)
 Anesthesia Evaluation  Patient identified by MRN, date of birth, ID band Patient awake    Reviewed: Allergy & Precautions, NPO status , Patient's Chart, lab work & pertinent test results  History of Anesthesia Complications Negative for: history of anesthetic complications  Airway Mallampati: II  TM Distance: >3 FB Neck ROM: Full    Dental no notable dental hx. (+) Teeth Intact   Pulmonary asthma , neg sleep apnea, neg COPD, Current SmokerPatient did not abstain from smoking. Takes PRN inhalers, took this morning out of precaution   breath sounds clear to auscultation       Cardiovascular Exercise Tolerance: Good hypertension, Pt. 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  For about 2 months w/               on/off pain associated. Check U/A, A1c, and BMP. No date: Lumbar disc herniation     Comment:  at L5-S1 No date: Lumbosacral radiculopathy No  date: Migraines No date: Moderate persistent asthma without complication No date: Pneumonia No date: PTSD (post-traumatic stress disorder) No date: Seizures (HCC) No date: Stroke [facility]     Comment:  had 3 mini strokes No date: TIA (transient ischemic attack)  Reproductive/Obstetrics                              Anesthesia Physical Anesthesia Plan  ASA: 3  Anesthesia Plan: General ETT   Post-op Pain Management:    Induction: Intravenous  PONV Risk Score and Plan:   Airway Management Planned: Oral ETT  Additional Equipment:   Intra-op Plan:   Post-operative Plan: Extubation in OR  Informed Consent: I have reviewed the patients History and Physical, chart, labs and discussed the procedure including the risks, benefits and alternatives for the proposed anesthesia with the patient or authorized representative who has indicated his/her understanding and acceptance.     Dental Advisory Given  Plan Discussed with: Anesthesiologist, CRNA and Surgeon  Anesthesia Plan Comments: (Patient consented for risks of anesthesia including but not limited to:  - adverse reactions to medications - damage to eyes, teeth, lips or other oral mucosa - nerve damage due to positioning  -  sore throat or hoarseness - Damage to heart, brain, nerves, lungs, other parts of body or loss of life  Patient voiced understanding and assent.)        Anesthesia Quick Evaluation

## 2024-07-08 NOTE — Op Note (Signed)
 Indications: [name] is suffering from lumbar radiculopathy.  She recently had a microdiscectomy for an L5-S1 disc herniation.  She was doing very well and had a complete resolution of her pain.  She was caring for her grandfather when he slipped and she went to grab him to keep him from falling she felt an immediate recurrence of her pain.  We attempted to treat conservatively with steroids, she continued to have persistent pain and worsening weakness.  Got a repeat MRI which demonstrated a recurrent disc herniation.  The patient tried and failed conservative management, prompting surgical intervention.  Findings: Large recurrent disc herniation causing severe compression of the traversing S1 nerve root.  Well decompressed at the end of the procedure.  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   Preoperative Note:   Risks of surgery discussed include: infection, bleeding, stroke, coma, death, paralysis, CSF leak, nerve/spinal cord injury, numbness, tingling, weakness, complex regional pain syndrome, recurrent stenosis and/or disc herniation, vascular injury, development of instability, neck/back pain, need for further surgery, persistent symptoms, development of deformity, and the risks of anesthesia. The patient understood these risks and agreed to proceed.  Operative Note:   1) left L 5/S1 microdiscectomy  The patient was then brought from the preoperative center with intravenous access established.  The patient underwent general  anesthesia and endotracheal tube intubation, and was then rotated on the [place] rail top where all pressure points were appropriately padded.  The skin was then thoroughly cleansed.  Perioperative antibiotic prophylaxis was administered.  Sterile prep and drapes were then applied and a timeout was then observed.  C-arm was brought into the field under sterile conditions, and the L 5-S1 disc space identified and marked with an incision on the left 1cm lateral to midline.  Once this was complete a 2 cm incision was opened with the use of a #10 blade knife.  The Metrx tubes were sequentially advanced under lateral fluoroscopy until a 18 x 90 mm Metrx tube was placed over the facet and lamina and secured to the bed.    We palpated slowly around to orient ourselves with her bony anatomy.  We are able to find the hemilaminotomy defect.  We carefully cleaned this off and were able to see the hemilaminotomy defect.  We centered our scope over that location.  We slowly dissected away the scar tissue.  As we are coming down we noticed disc material laterally causing medial compression and medial displacement of the traversing nerve root and dura.  We slowly dissected this off of the nerve root and were able to get a good lateral decompression.  We continue to follow the lateral aspect of the nerve root downwards past the disc space.  As we retracted it medially we are then able to palpate a large recurrent piece of disc herniation.  This was taken out piecemeal.  At this point the nerve root was able to relax and come back into its natural plane.  We decompressed more over the top of it to follow it out as it exited towards the foramen.  At the end of the decompression it was well decompressed appeared to be  back into his normal state/plane, and no longer had any areas of compression noted on top of it.  We irrigated copiously.  We obtained meticulous hemostasis.  We placed Depo-Medrol  in the nerve.  We then  closed in multiple layers with glue on the top  I performed the entire procedure with the assistance of  Lyle Decamp, PA-C as an Designer, television/film set. An assistant was required for this procedure due to the complexity.  The assistant provided assistance in tissue manipulation and suction, closure and was required for the successful and safe performance of the procedure. I performed the critical portions of the procedure.   Penne LELON Sharps, MD [facility] Neurosurgery

## 2024-07-08 NOTE — Discharge Instructions (Signed)
 Your surgeon has performed an operation on your lumbar spine (low back) to relieve pressure on one or more nerves. Many times, patients feel better immediately after surgery and can "overdo it." Even if you feel well, it is important that you follow these activity guidelines. If you do not let your back heal properly from the surgery, you can increase the chance of hardware complications and/or return of your symptoms. The following are instructions to help in your recovery once you have been discharged from the hospital.   Activity    No bending, lifting, or twisting ("BLT"). Avoid lifting objects heavier than 10 pounds (gallon milk jug).  Where possible, avoid household activities that involve lifting, bending, pushing, or pulling such as laundry, vacuuming, grocery shopping, and childcare. Try to arrange for help from friends and family for these activities while your back heals.  Increase physical activity slowly as tolerated.  Taking short walks is encouraged, but avoid strenuous exercise. Do not jog, run, bicycle, lift weights, or participate in any other exercises unless specifically allowed by your doctor. Avoid prolonged sitting, including car rides.  Talk to your doctor before resuming sexual activity.  You should not drive until cleared by your doctor.  Until released by your doctor, you should not return to work or school.  You should rest at home and let your body heal.   You may shower three days after your surgery.  After showering, lightly dab your incision dry. Do not take a tub bath or go swimming for 3 weeks, or until approved by your doctor at your follow-up appointment.  If you smoke, we strongly recommend that you quit.  Smoking has been proven to interfere with normal healing in your back and will dramatically reduce the success rate of your surgery. Please contact [facility] (800-QUIT-NOW) and use the resources at www.[facility].com for assistance in stopping  smoking.  Surgical Incision   If you have a dressing on your incision, you may remove it three days after your surgery. Keep your incision area clean and dry.  Your incision was closed with Dermabond glue. The glue should begin to peel away within about a week.  Diet            You may return to your usual diet. Be sure to stay hydrated.  You have been prescribed narcotic pain medications.  This often will cause constipation along with the anesthesia that you underwent.  Please obtain Colace and senna. This has been sent to your local pharmacy, but at times it is not covered by insurance and you may obtain it over the counter..  You should take a stool softener and laxative for the duration of you taking the narcotic pain medications.  When to Contact Us   Although your surgery and recovery will likely be uneventful, you may have some residual numbness, aches, and pains in your back and/or legs. This is normal and should improve in the next few weeks.  However, should you experience any of the following, contact us  immediately: New numbness or weakness Pain that is progressively getting worse, and is not relieved by your pain medications or rest Bleeding, redness, swelling, pain, or drainage from surgical incision Chills or flu-like symptoms Fever greater than 101.0 F (38.3 C) Problems with bowel or bladder functions Difficulty breathing or shortness of breath Warmth, tenderness, or swelling in your calf  Contact Information How to contact us :  If you have any questions/concerns before or after surgery, you can reach us  at [phone],  or you can send a [employer]. We can be reached by phone or mychart 8am-4pm, Monday-Friday.  *Please note: Calls after 4pm are forwarded to a third party answering service. Mychart messages are not routinely monitored during evenings, weekends, and holidays. Please call our office to contact the answering service for urgent concerns during  non-business hours.

## 2024-07-09 ENCOUNTER — Encounter: Payer: Self-pay | Admitting: Neurosurgery

## 2024-07-11 ENCOUNTER — Encounter: Payer: Self-pay | Admitting: Neurosurgery

## 2024-07-12 ENCOUNTER — Other Ambulatory Visit: Payer: Self-pay | Admitting: Physician Assistant

## 2024-07-12 DIAGNOSIS — M5416 Radiculopathy, lumbar region: Secondary | ICD-10-CM

## 2024-07-12 DIAGNOSIS — M5106 Intervertebral disc disorders with myelopathy, lumbar region: Secondary | ICD-10-CM

## 2024-07-12 MED ORDER — GABAPENTIN 300 MG PO CAPS: 300.0000 mg | ORAL_CAPSULE | Freq: Three times a day (TID) | ORAL | 2 refills | Status: DC

## 2024-07-12 MED ORDER — OXYCODONE HCL 5 MG PO TABS: 5.0000 mg | ORAL_TABLET | ORAL | 0 refills | Status: DC | PRN

## 2024-07-12 MED ORDER — CYCLOBENZAPRINE HCL 10 MG PO TABS: 10.0000 mg | ORAL_TABLET | Freq: Three times a day (TID) | ORAL | 0 refills | Status: DC | PRN

## 2024-07-14 ENCOUNTER — Encounter: Admitting: Physician Assistant

## 2024-07-20 ENCOUNTER — Encounter: Admitting: Physician Assistant

## 2024-07-20 ENCOUNTER — Encounter: Payer: Self-pay | Admitting: Physician Assistant

## 2024-07-20 ENCOUNTER — Ambulatory Visit (INDEPENDENT_AMBULATORY_CARE_PROVIDER_SITE_OTHER): Admitting: Physician Assistant

## 2024-07-20 DIAGNOSIS — M5416 Radiculopathy, lumbar region: Secondary | ICD-10-CM

## 2024-07-20 DIAGNOSIS — M5106 Intervertebral disc disorders with myelopathy, lumbar region: Secondary | ICD-10-CM

## 2024-07-20 DIAGNOSIS — Z09 Encounter for follow-up examination after completed treatment for conditions other than malignant neoplasm: Secondary | ICD-10-CM

## 2024-07-20 MED ORDER — CYCLOBENZAPRINE HCL 10 MG PO TABS
10.0000 mg | ORAL_TABLET | Freq: Three times a day (TID) | ORAL | 0 refills | Status: DC | PRN
Start: 2024-07-20 — End: 2024-08-02

## 2024-07-20 MED ORDER — OXYCODONE HCL 5 MG PO TABS: 5.0000 mg | ORAL_TABLET | ORAL | 0 refills | Status: DC | PRN

## 2024-07-20 NOTE — Progress Notes (Signed)
   REFERRING PHYSICIAN:  [facility] [address] [place],  [place] [address]  DOS: 07/08/2024, redo left L5-S1 discectomy  HISTORY OF PRESENT ILLNESS: Karen Hunt is approximately 2 weeks status post left L5-S1 discectomy. she is doing well.  She does still have some residual back pain and left lower extremity numbness and tingling however this is much improved compared to prior to surgery.  No significant weakness.  PHYSICAL EXAMINATION:  General: Patient is well developed, well nourished, calm, collected, and in no apparent distress.   NEUROLOGICAL:  General: In no acute distress.   Awake, alert, oriented to person, place, and time.  Pupils equal round and reactive to light.    Strength:            Side Iliopsoas Quads Hamstring PF DF EHL  R 5 5 5 5 5 5   L 5 5 5 5 5 5    Incision c/d/i   ROS (Neurologic):  Negative except as noted above  IMAGING: No new imaging prior to splint.  ASSESSMENT/PLAN:  Karen Hunt is doing well after redo left L5-S1 discectomy on 07/08/2024.  She does still have some residual back pain and left lower extremity tingling, but it is much improved compared to prior to surgery.I have advised the patient to lift up to 10 pounds until 6 weeks after surgery, then increase up to 25 pounds until 12 weeks after surgery.  After 12 weeks post-op, the patient advised to increase activity as tolerated.  Refill sent to her preferred pharmacy.  She continues to take Tylenol , oxycodone , Flexeril .  Advised to contact the office if any questions or concerns arise.  Lyle Decamp PA-C Department of neurosurgery

## 2024-08-02 ENCOUNTER — Other Ambulatory Visit: Payer: Self-pay | Admitting: Physician Assistant

## 2024-08-02 MED ORDER — BACLOFEN 10 MG PO TABS: 5.0000 mg | ORAL_TABLET | Freq: Three times a day (TID) | ORAL | 1 refills | Status: DC | PRN

## 2024-08-11 ENCOUNTER — Encounter: Payer: Self-pay | Admitting: Neurosurgery

## 2024-08-11 ENCOUNTER — Ambulatory Visit
Admission: RE | Admit: 2024-08-11 | Discharge: 2024-08-11 | Disposition: A | Discharge status: Home / Self Care | Attending: Neurosurgery | Admitting: Neurosurgery

## 2024-08-11 ENCOUNTER — Ambulatory Visit
Admission: RE | Admit: 2024-08-11 | Discharge: 2024-08-11 | Disposition: A | Discharge status: Home / Self Care | Source: Ambulatory Visit | Attending: Neurosurgery | Admitting: Neurosurgery

## 2024-08-11 ENCOUNTER — Ambulatory Visit: Admitting: Neurosurgery

## 2024-08-11 VITALS — BP 128/80 | Temp 98.1°F | Ht 69.0 in | Wt 325.0 lb

## 2024-08-11 DIAGNOSIS — Z09 Encounter for follow-up examination after completed treatment for conditions other than malignant neoplasm: Secondary | ICD-10-CM

## 2024-08-11 DIAGNOSIS — M5106 Intervertebral disc disorders with myelopathy, lumbar region: Secondary | ICD-10-CM | POA: Diagnosis present

## 2024-08-11 DIAGNOSIS — M5416 Radiculopathy, lumbar region: Secondary | ICD-10-CM | POA: Diagnosis present

## 2024-08-11 MED ORDER — OXYCODONE HCL 5 MG PO TABS: 5.0000 mg | ORAL_TABLET | ORAL | 0 refills | Status: DC | PRN

## 2024-08-11 NOTE — Progress Notes (Signed)
   REFERRING PHYSICIAN:  [facility] [address] [place],  [place] [address]  DOS: 07/08/2024, redo left L5-S1 discectomy  HISTORY OF PRESENT ILLNESS: Karen Hunt is approximately 6 weeks status post left L5-S1 discectomy.  Her radiating lower extremity pain has improved better back continues to give her significant discomfort.  She has not yet started physical therapy.  She has not had any recent imaging.   PHYSICAL EXAMINATION:  General: Patient is well developed, well nourished, calm, collected, and in no apparent distress.   NEUROLOGICAL:  General: In no acute distress.   Awake, alert, oriented to person, place, and time.  Pupils equal round and reactive to light.    Strength:            Side Iliopsoas Quads Hamstring PF DF EHL  R 5 5 5 5 5 5   L 5 5 5 5 5 5    Incision c/d/i   ROS (Neurologic):  Negative except as noted above  IMAGING: No new imaging prior to splint.  ASSESSMENT/PLAN:  Karen Hunt is doing well after redo left L5-S1 discectomy on 07/08/2024.  Her lower extremity pain has improved significantly but her back is very bothersome to her right now.  Is causing significant discomfort.  She has not had postoperative x-rays will plan on getting x-rays to evaluate for any postoperative spondylolisthesis.  She has not yet started physical therapy, we will plan on initiating a physical therapy regimen for her to help with the postoperative recovery.  We can refill her medications as she is still in the postoperative period.   Penne MICAEL Sharps, MD Department of neurosurgery

## 2024-08-18 ENCOUNTER — Encounter: Payer: Self-pay | Admitting: Neurosurgery

## 2024-08-18 ENCOUNTER — Other Ambulatory Visit: Payer: Self-pay | Admitting: Physician Assistant

## 2024-08-18 DIAGNOSIS — M5416 Radiculopathy, lumbar region: Secondary | ICD-10-CM

## 2024-08-18 DIAGNOSIS — M5106 Intervertebral disc disorders with myelopathy, lumbar region: Secondary | ICD-10-CM

## 2024-08-18 MED ORDER — GABAPENTIN 300 MG PO CAPS: 600.0000 mg | ORAL_CAPSULE | Freq: Three times a day (TID) | ORAL | 2 refills | Status: DC

## 2024-08-18 MED ORDER — OXYCODONE HCL 5 MG PO TABS: 5.0000 mg | ORAL_TABLET | ORAL | 0 refills | Status: DC | PRN

## 2024-08-18 MED ORDER — DIAZEPAM 2 MG PO TABS: 2.0000 mg | ORAL_TABLET | Freq: Once | ORAL | 0 refills | Status: AC

## 2024-08-18 MED ORDER — BACLOFEN 10 MG PO TABS: 5.0000 mg | ORAL_TABLET | Freq: Three times a day (TID) | ORAL | 1 refills | Status: DC | PRN

## 2024-08-20 ENCOUNTER — Encounter: Payer: Self-pay | Admitting: Physician Assistant

## 2024-08-23 ENCOUNTER — Ambulatory Visit
Admission: RE | Admit: 2024-08-23 | Discharge: 2024-08-23 | Disposition: A | Discharge status: Home / Self Care | Source: Ambulatory Visit | Attending: Physician Assistant | Admitting: Physician Assistant

## 2024-08-23 DIAGNOSIS — M5106 Intervertebral disc disorders with myelopathy, lumbar region: Secondary | ICD-10-CM

## 2024-08-23 DIAGNOSIS — M5416 Radiculopathy, lumbar region: Secondary | ICD-10-CM

## 2024-08-24 ENCOUNTER — Encounter: Payer: Self-pay | Admitting: Neurosurgery

## 2024-08-25 ENCOUNTER — Other Ambulatory Visit: Payer: Self-pay | Admitting: Physician Assistant

## 2024-08-25 DIAGNOSIS — M5416 Radiculopathy, lumbar region: Secondary | ICD-10-CM

## 2024-08-25 DIAGNOSIS — M5106 Intervertebral disc disorders with myelopathy, lumbar region: Secondary | ICD-10-CM

## 2024-08-25 MED ORDER — OXYCODONE HCL 5 MG PO TABS: 5.0000 mg | ORAL_TABLET | ORAL | 0 refills | Status: DC | PRN

## 2024-08-30 ENCOUNTER — Encounter: Payer: Self-pay | Admitting: Neurosurgery

## 2024-08-31 NOTE — Telephone Encounter (Signed)
 Scheduled for [id number]

## 2024-09-01 ENCOUNTER — Ambulatory Visit (INDEPENDENT_AMBULATORY_CARE_PROVIDER_SITE_OTHER): Admitting: Physician Assistant

## 2024-09-01 ENCOUNTER — Other Ambulatory Visit: Payer: Self-pay | Admitting: Physician Assistant

## 2024-09-01 DIAGNOSIS — M5126 Other intervertebral disc displacement, lumbar region: Secondary | ICD-10-CM

## 2024-09-01 DIAGNOSIS — Z09 Encounter for follow-up examination after completed treatment for conditions other than malignant neoplasm: Secondary | ICD-10-CM

## 2024-09-01 DIAGNOSIS — M5416 Radiculopathy, lumbar region: Secondary | ICD-10-CM

## 2024-09-01 MED ORDER — HYDROMORPHONE HCL 2 MG PO TABS: 2.0000 mg | ORAL_TABLET | Freq: Four times a day (QID) | ORAL | 0 refills | Status: DC | PRN

## 2024-09-01 MED ORDER — DIAZEPAM 2 MG PO TABS: 2.0000 mg | ORAL_TABLET | Freq: Once | ORAL | 0 refills | Status: AC

## 2024-09-01 MED ORDER — METHYLPREDNISOLONE 4 MG PO TBPK
ORAL_TABLET | ORAL | 0 refills | Status: DC
Start: 2024-09-01 — End: 2024-09-15

## 2024-09-01 MED ORDER — GABAPENTIN 300 MG PO CAPS: 900.0000 mg | ORAL_CAPSULE | Freq: Three times a day (TID) | ORAL | 2 refills | Status: DC

## 2024-09-01 NOTE — Progress Notes (Signed)
 07/08/2024, redo left L5-S1 discectomy   Spoke with patient over the phone today regarding her significant low back pain.  She states that it is radiating down her left leg into the bottom of her foot and heel and is now newly on the right side into her right buttock.  This is quite severe in nature.  She has not noticed any new weakness at this time.  We discussed her MRI that was recently completed and that it was hard to tell if she had a reherniated disc or superimposed scar tissue.  Preferably would have MRI scan with contrast which has been ordered.  Adjustments were made to her gabapentin , oxycodone  switched to Dilaudid , and a short-term Medrol  Dosepak.  Risks of these medications were discussed at length.  Patient agrees to move forward.  If patient's pain continues how it is, would consider discussing further surgery including spinal cord stimulator.  Will review new MRI once complete.    EXAM: MRI LUMBAR SPINE WITHOUT CONTRAST   TECHNIQUE: Multiplanar, multisequence MR imaging of the lumbar spine was performed. No intravenous contrast was administered.   COMPARISON:  MRI of the lumbar spine June 27, 2024.   FINDINGS: Segmentation:  Standard.   Alignment:  Normal.   Vertebrae:  No fracture, evidence of discitis, or bone lesion.   Conus medullaris and cauda equina: Conus extends to the L1 level. Conus appears normal.   Paraspinal and other soft tissues: Postoperative changes in the left paraspinal soft tissues at L5-S1.   Disc levels:   No significant canal or foraminal stenosis at T12-L1 to L4-L5.   At L5-S1, moderate area of hypointensity in the left subarticular recess. Resulting moderate to severe left greater than right subarticular recess stenosis with possible impingement of descending left S1 nerve roots. At least moderate overall canal stenosis. Left laminectomy at this level. Findings are similar versus slightly improved in comparison to June 29th. Patent  foramina.   IMPRESSION: 1. At L5-S1, redemonstrated postoperative changes of left laminectomy with similar versus mildly improved moderate area of hypointensity in the left subarticular recess which is suspicious for recurrent disc protrusion with possible impingement of descending left greater than right S1 nerve roots. Superimposed scar tissue is possible and postcontrast imaging could better characterize if clinically warranted. 2. No significant canal or foraminal stenosis at the other levels.

## 2024-09-02 ENCOUNTER — Encounter: Payer: Self-pay | Admitting: Physician Assistant

## 2024-09-03 ENCOUNTER — Other Ambulatory Visit: Payer: Self-pay | Admitting: Physician Assistant

## 2024-09-03 DIAGNOSIS — R39198 Other difficulties with micturition: Secondary | ICD-10-CM

## 2024-09-06 ENCOUNTER — Other Ambulatory Visit: Payer: Self-pay | Admitting: Physician Assistant

## 2024-09-06 MED ORDER — BACLOFEN 10 MG PO TABS: 5.0000 mg | ORAL_TABLET | Freq: Three times a day (TID) | ORAL | 1 refills | Status: DC | PRN

## 2024-09-06 MED ORDER — HYDROMORPHONE HCL 2 MG PO TABS: 2.0000 mg | ORAL_TABLET | Freq: Four times a day (QID) | ORAL | 0 refills | Status: DC | PRN

## 2024-09-07 ENCOUNTER — Inpatient Hospital Stay: Admission: RE | Admit: 2024-09-07 | Source: Ambulatory Visit

## 2024-09-09 ENCOUNTER — Other Ambulatory Visit: Payer: Self-pay | Admitting: Physician Assistant

## 2024-09-09 MED ORDER — HYDROMORPHONE HCL 2 MG PO TABS: 2.0000 mg | ORAL_TABLET | Freq: Four times a day (QID) | ORAL | 0 refills | Status: DC | PRN

## 2024-09-09 NOTE — Progress Notes (Signed)
 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)   - Followed through transitional program at [facility] until early 20s for surveillance, no issues  History of back pain (herniated disc at lumbosacral spine status post repair), history of hidradenitis suppurativa (on doxycycline)   PMH: Past Medical History:  Diagnosis Date   Anxiety    Arthritis    osteoarthritis   Chronic kidney disease    1 kidney was removed at 2 mos of age due to polycystic fibrosis on   Complication of anesthesia    slow to wake up and coughing a lot after tubes placed in 2024   Depression    Fibromyalgia    GERD (gastroesophageal reflux disease)    Hypertension    Increased frequency of urination 07/03/2021   Last Assessment & Plan:   Formatting of this note might be different from the original.  For about 2 months w/ on/off pain associated. Check U/A, A1c, and BMP.   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   Seizures (HCC)    Sleep apnea    Stroke (HCC)    had 3 mini strokes   TIA (transient ischemic attack)     Surgical History: Past Surgical History:  Procedure Laterality  Date   c section     DILATION AND CURETTAGE OF UTERUS     FOOT SURGERY Left 2010   KIDNEY SURGERY  04/1991   LUMBAR LAMINECTOMY/DECOMPRESSION MICRODISCECTOMY Left 04/29/2024   Procedure: LUMBAR LAMINECTOMY/DECOMPRESSION MICRODISCECTOMY 1 LEVEL;  Surgeon: Claudene Penne ORN, MD;  Location: [facility] ORS;  Service: Neurosurgery;  Laterality: Left;  LEFT L5-S1 MICRODISCECTOMY WITH POSSIBLE CONVERTION TO OPEN   LUMBAR LAMINECTOMY/DECOMPRESSION MICRODISCECTOMY Left 07/08/2024   Procedure: LUMBAR LAMINECTOMY/DECOMPRESSION MICRODISCECTOMY 1 LEVEL;  Surgeon: Claudene Penne ORN, MD;  Location: [facility] ORS;  Service: Neurosurgery;  Laterality: Left;  REDO LEFT L5-S1 DISCECTOMY   MYRINGOTOMY  1996   MYRINGOTOMY WITH TUBE PLACEMENT Bilateral 05/14/2023   Procedure: MYRINGOTOMY WITH TUBE PLACEMENT;  Surgeon: Blair Mt, MD;  Location: [facility] ORS;  Service: ENT;  Laterality: Bilateral;   WISDOM TOOTH EXTRACTION     at the age of 52    Family History: No family history on file.  Social History:  reports that she has been smoking cigarettes. She has never used smokeless tobacco. She reports current alcohol use. She reports current drug use. Drug: Marijuana.      Physical Exam: BP (!) 157/79   Pulse 65   Ht 5' 9 (1.753 m)   Wt (!) 325 lb (147.4 kg)   BMI 47.99 kg/m    Constitutional:  Alert and oriented, No acute distress. Cardiovascular: No clubbing, cyanosis, or edema. Respiratory: Normal respiratory effort, no increased work of breathing. GI: Nondistended Skin: No  rashes, bruises or suspicious lesions. Neurologic: Grossly intact, no focal deficits, moving all 4 extremities. Psychiatric: Normal mood and affect.  Laboratory Data: N/A   Pertinent Imaging: N/A    Assessment & Plan:    Difficulty urinating -     Bladder Scan (Post Void Residual) in office  Dysfunctional voiding of urine Assessment & Plan: Subacute dysfunctional voiding Subjective incomplete emptying, hesitancy, intermittency   Began following recent lumbar back surgeries, ongoing radiculopathies  PVR 1 cc today  Hx of solitary Left kidney   I reviewed her clinical history and symptoms today.  Unclear etiology.  Relationship to recent mom bar surgeries is noted, perhaps neuropathic palsy or irritation to bladder.  For now would like to monitor closely, image solitary left kidney and bladder with ultrasound.  Periodic follow-up to assess symptom burden particularly as she is continuing to undergo lumbar workup, possible future surgeries as well  - RBUS - follow up in ~2-3 months for symptom check, sooner if needed       Penne Skye, MD 09/15/2024  [facility] [address] [place], [place] [address] [phone]

## 2024-09-13 ENCOUNTER — Other Ambulatory Visit: Payer: Self-pay | Admitting: Physician Assistant

## 2024-09-13 DIAGNOSIS — M7989 Other specified soft tissue disorders: Secondary | ICD-10-CM

## 2024-09-13 DIAGNOSIS — M5126 Other intervertebral disc displacement, lumbar region: Secondary | ICD-10-CM

## 2024-09-14 ENCOUNTER — Encounter: Payer: Self-pay | Admitting: Physician Assistant

## 2024-09-15 ENCOUNTER — Ambulatory Visit (INDEPENDENT_AMBULATORY_CARE_PROVIDER_SITE_OTHER): Admitting: Urology

## 2024-09-15 VITALS — BP 157/79 | HR 65 | Ht 69.0 in | Wt 325.0 lb

## 2024-09-15 DIAGNOSIS — R39198 Other difficulties with micturition: Secondary | ICD-10-CM | POA: Diagnosis not present

## 2024-09-15 DIAGNOSIS — N398 Other specified disorders of urinary system: Secondary | ICD-10-CM | POA: Diagnosis not present

## 2024-09-15 LAB — BLADDER SCAN AMB NON-IMAGING: Scan Result: 1

## 2024-09-15 NOTE — Assessment & Plan Note (Signed)
 Subacute dysfunctional voiding Subjective incomplete emptying, hesitancy, intermittency  Began following recent lumbar back surgeries, ongoing radiculopathies  PVR 1 cc today  Hx of solitary Left kidney   I reviewed her clinical history and symptoms today.  Unclear etiology.  Relationship to recent mom bar surgeries is noted, perhaps neuropathic palsy or irritation to bladder.  For now would like to monitor closely, image solitary left kidney and bladder with ultrasound.  Periodic follow-up to assess symptom burden particularly as she is continuing to undergo lumbar workup, possible future surgeries as well  - RBUS - follow up in ~2-3 months for symptom check, sooner if needed

## 2024-09-16 ENCOUNTER — Other Ambulatory Visit: Payer: Self-pay | Admitting: Physician Assistant

## 2024-09-16 MED ORDER — OXYCODONE HCL 5 MG PO TABS: 5.0000 mg | ORAL_TABLET | ORAL | 0 refills | Status: DC | PRN

## 2024-09-16 NOTE — Addendum Note (Signed)
 Addended by: Jaela Yepez W on: 09/16/2024 04:22 PM   Modules accepted: Orders

## 2024-09-20 ENCOUNTER — Ambulatory Visit
Admission: RE | Admit: 2024-09-20 | Discharge: 2024-09-20 | Disposition: A | Discharge status: Home / Self Care | Source: Ambulatory Visit | Attending: Urology | Admitting: Urology

## 2024-09-20 DIAGNOSIS — N398 Other specified disorders of urinary system: Secondary | ICD-10-CM | POA: Insufficient documentation

## 2024-09-20 DIAGNOSIS — R39198 Other difficulties with micturition: Secondary | ICD-10-CM | POA: Diagnosis present

## 2024-09-21 ENCOUNTER — Encounter: Payer: Self-pay | Admitting: Physician Assistant

## 2024-09-21 ENCOUNTER — Ambulatory Visit
Admission: RE | Admit: 2024-09-21 | Discharge: 2024-09-21 | Disposition: A | Discharge status: Home / Self Care | Source: Ambulatory Visit | Attending: Physician Assistant | Admitting: Physician Assistant

## 2024-09-21 ENCOUNTER — Ambulatory Visit (INDEPENDENT_AMBULATORY_CARE_PROVIDER_SITE_OTHER): Admitting: Physician Assistant

## 2024-09-21 VITALS — BP 132/82 | Ht 69.0 in | Wt 325.0 lb

## 2024-09-21 DIAGNOSIS — M5127 Other intervertebral disc displacement, lumbosacral region: Secondary | ICD-10-CM

## 2024-09-21 DIAGNOSIS — R29898 Other symptoms and signs involving the musculoskeletal system: Secondary | ICD-10-CM

## 2024-09-21 DIAGNOSIS — M5416 Radiculopathy, lumbar region: Secondary | ICD-10-CM

## 2024-09-21 DIAGNOSIS — W19XXXA Unspecified fall, initial encounter: Secondary | ICD-10-CM

## 2024-09-21 DIAGNOSIS — Z09 Encounter for follow-up examination after completed treatment for conditions other than malignant neoplasm: Secondary | ICD-10-CM

## 2024-09-21 DIAGNOSIS — M5106 Intervertebral disc disorders with myelopathy, lumbar region: Secondary | ICD-10-CM

## 2024-09-21 DIAGNOSIS — M5126 Other intervertebral disc displacement, lumbar region: Secondary | ICD-10-CM

## 2024-09-21 MED ORDER — BACLOFEN 10 MG PO TABS: 5.0000 mg | ORAL_TABLET | Freq: Three times a day (TID) | ORAL | 1 refills | Status: DC | PRN

## 2024-09-21 MED ORDER — SENNA 8.6 MG PO TABS: 1.0000 | ORAL_TABLET | Freq: Every day | ORAL | 0 refills | Status: AC

## 2024-09-21 MED ORDER — GADOPICLENOL 0.5 MMOL/ML IV SOLN
10.0000 mL | Freq: Once | INTRAVENOUS | Status: AC | PRN
  Administered 2024-09-21: 10 mL via INTRAVENOUS

## 2024-09-21 MED ORDER — DOCUSATE SODIUM 100 MG PO CAPS: 100.0000 mg | ORAL_CAPSULE | Freq: Two times a day (BID) | ORAL | 0 refills | Status: DC

## 2024-09-21 MED ORDER — OXYCODONE HCL 5 MG PO TABS: 5.0000 mg | ORAL_TABLET | ORAL | 0 refills | Status: DC | PRN

## 2024-09-21 NOTE — Progress Notes (Unsigned)
   REFERRING PHYSICIAN:  Claudene Elsie NOVAK, Md [address] [facility] [place],  [place] [address]  DOS: 07/08/2024, redo left L5-S1 discectomy  HISTORY OF PRESENT ILLNESS: Karen Hunt is approximately 6 weeks status post left L5-S1 discectomy.  Her radiating lower extremity pain has improved better back continues to give her significant discomfort.  She has not yet started physical therapy.  She has not had any recent imaging.   New left foot weakness foot is getting caught, limping really bad  After new fall 3 weeks ago fell abckwards. Foot sensation    PHYSICAL EXAMINATION:  General: Patient is well developed, well nourished, calm, collected, and in no apparent distress.   NEUROLOGICAL:  General: In no acute distress.   Awake, alert, oriented to person, place, and time.  Pupils equal round and reactive to light.    Strength:  DF, EHL 4- PF-4            Side Iliopsoas Quads Hamstring PF DF EHL  R 5 5 5 5 5 5   L 5 5 5 5 5 5    Incision c/d/i  Gait- limping bad, steoppage gait    ROS (Neurologic):  Negative except as noted above  IMAGING: No new imaging prior to splint.  ASSESSMENT/PLAN:  Karen Hunt is doing well after redo left L5-S1 discectomy on 07/08/2024.  Her lower extremity pain has improved significantly but her back is very bothersome to her right now.  Is causing significant discomfort.  She has not had postoperative x-rays will plan on getting x-rays to evaluate for any postoperative spondylolisthesis.  Pain at [facility]  She has not yet started physical therapy, we will plan on initiating a physical therapy regimen for her to help with the postoperative recovery.  We can refill her medications as she is still in the postoperative period.   Penne MICAEL Claudene, MD Department of neurosurgery

## 2024-09-23 ENCOUNTER — Ambulatory Visit: Admitting: Urology

## 2024-09-27 ENCOUNTER — Encounter: Admitting: Physician Assistant

## 2024-09-27 ENCOUNTER — Other Ambulatory Visit: Payer: Self-pay | Admitting: Physician Assistant

## 2024-09-27 MED ORDER — OXYCODONE HCL 5 MG PO TABS: 5.0000 mg | ORAL_TABLET | ORAL | 0 refills | Status: DC | PRN

## 2024-09-29 ENCOUNTER — Ambulatory Visit

## 2024-10-01 ENCOUNTER — Other Ambulatory Visit

## 2024-10-04 ENCOUNTER — Other Ambulatory Visit: Payer: Self-pay | Admitting: Physician Assistant

## 2024-10-04 MED ORDER — OXYCODONE HCL 5 MG PO TABS: 5.0000 mg | ORAL_TABLET | ORAL | 0 refills | Status: DC | PRN

## 2024-10-08 ENCOUNTER — Other Ambulatory Visit: Payer: Self-pay | Admitting: Physician Assistant

## 2024-10-08 MED ORDER — OXYCODONE HCL 5 MG PO TABS: 5.0000 mg | ORAL_TABLET | ORAL | 0 refills | Status: DC | PRN

## 2024-10-08 NOTE — Progress Notes (Signed)
 Refill.

## 2024-10-11 ENCOUNTER — Ambulatory Visit
Admission: RE | Admit: 2024-10-11 | Discharge: 2024-10-11 | Disposition: A | Discharge status: Home / Self Care | Source: Ambulatory Visit | Attending: Physician Assistant | Admitting: Physician Assistant

## 2024-10-11 DIAGNOSIS — M5126 Other intervertebral disc displacement, lumbar region: Secondary | ICD-10-CM | POA: Insufficient documentation

## 2024-10-11 DIAGNOSIS — M79669 Pain in unspecified lower leg: Secondary | ICD-10-CM | POA: Diagnosis present

## 2024-10-11 DIAGNOSIS — M7989 Other specified soft tissue disorders: Secondary | ICD-10-CM | POA: Diagnosis present

## 2024-10-13 ENCOUNTER — Ambulatory Visit (INDEPENDENT_AMBULATORY_CARE_PROVIDER_SITE_OTHER): Admitting: Neurosurgery

## 2024-10-13 ENCOUNTER — Encounter: Payer: Self-pay | Admitting: Neurosurgery

## 2024-10-13 VITALS — BP 132/70 | Ht 69.0 in | Wt 350.0 lb

## 2024-10-13 DIAGNOSIS — M5416 Radiculopathy, lumbar region: Secondary | ICD-10-CM

## 2024-10-13 NOTE — Patient Instructions (Signed)

## 2024-10-13 NOTE — Progress Notes (Signed)
 REFERRING PHYSICIAN:  [facility] [address] [place],  [place] [address]  DOS: 07/08/2024, redo left L5-S1 discectomy  HISTORY OF PRESENT ILLNESS: Karen Hunt is here today to follow-up with continued back pain and lower extremity pain.  She had a very good response after her first decompression, but unfortunately while catching someone blocking following and that repeat herniation and decompression but has had significant pain ever since that time.  When we last saw her her examination did not clearly fit a L5 or S1 type myotome/dermatome so repeat imaging was performed.  We spent a long time this morning discussing multiple issues.  She let us  know that she previously tried medical weight loss but that the medications caused her severe nausea and she was unable to stay on these.  She also previously worked with a nutritionist but had not followed up as she does not feel that is able to exercise given her back issues.  We also discussed her current psychiatric history.  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  PHYSICAL EXAMINATION:  General: Patient is well developed.  She is intermittently tearful.   NEUROLOGICAL:  Awake, alert, oriented to person, place, and time.  Pupils equal round and reactive to light.    Strength:  Full strength proximally.  Distally on the left side, very difficult to assess fully secondary to pain/effort.  Dorsiflexion at a 4, EHL, 4 -, plantarflexion out of 4.  Incision c/d/i  ROS (Neurologic):  Negative except as noted above  IMAGING: EXAM: MRI LUMBAR SPINE WITHOUT AND WITH CONTRAST   TECHNIQUE: Multiplanar and multiecho pulse sequences of the lumbar spine were obtained without and with intravenous contrast.   CONTRAST:  10 cc Vueway    COMPARISON:  MRI 08/23/2024.  06/27/2024.  04/03/2024.    FINDINGS: Segmentation: 5 lumbar type vertebral bodies as numbered previously.   Alignment:  Normal   Vertebrae: Minimal endplate changes at L5-S1 related to chronic degenerative disc disease.   Conus medullaris and cauda equina: Conus extends to the L1 level. Conus and cauda equina appear normal.   Paraspinal and other soft tissues: Negative   Disc levels:   T12-L1: Disc bulge.  No compressive stenosis.   L1-2 through L3-4: Normal   L4-5: Minimal disc bulge. Minimal facet and ligamentous hypertrophy. No compressive stenosis.   L5-S1: Previous left laminotomy and discectomy. There is a small recurrent left posterolateral disc herniation, smaller than the initial lesion seen in April. Small amount of regional postsurgical enhancement additionally present. It is possible this could affect the left S1 nerve. No right-sided compressive abnormality. L5 nerve exits without compression.   IMPRESSION: L5-S1: Previous left laminotomy and discectomy. Small recurrent left posterolateral disc herniation, smaller than the initial lesion seen in April. Small amount of regional postsurgical enhancement additionally present. It is possible this could affect the left S1 nerve. No right-sided compressive abnormality.  ASSESSMENT/PLAN:  Karen Hunt is 3+ months status post re-do left L5-S1 discectomy.  She continues to have significant pain and she continues to feel like she has lower extremity weakness.  Her physical exam is quite difficult given her pain level and some intermittent effort differences.  Her S1 nerve root looks much better than it did previously and the compression is much less.  She does have some scar tissue formation.  We had a long discussion about her overall wellbeing.  I recommended to her that it would be ideal to have her reestablish with her therapist given her significant depression symptoms, she had multiple moments during the consultation where she would  break out into tears and state that she felt hopeless.  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  I did let her know the importance of maintaining her mental health and its role and association with poor pain control.  In some cases of patients with severe depression as well as neuropathic pain Cymbalta can be a good choice as it has a neuropathic effect however we will defer this to her primary care/psychiatric care team.  She has not been able to work with physical therapy postop, I encouraged her to work with physical therapy as this is a major portion of her recovery.  We discussed that if she would eventually necessitate a lumbar fusion that we would need to get better control of her BMI.  She has previously had a team including a nutritionist and a medical prescription for weight loss medications but was unable to tolerate them.  She states that she feels she cannot lose weight because she cannot hike like she used to.  I did discuss with her the importance of nutrition and the case of weight loss and that it would likely be helpful her for her to discuss this further with a nutritionist at a more significant portion of the calorie deficit comes from diet rather than exercise.  Although both are important.  If she is unable to get control of her pain we would consider evaluation for a spinal cord stimulator, however in the case of her's current psychological health and increasing bodyweight. We Encouraged her to be optimized prior to making any of those decisions going forward.  I would like to follow-up with her after she has spent some time with physical therapy, reestablished care with psychiatry and psychotherapy, and had a chance to reestablish with her nutrition and medical weight loss team.  Penne MICAEL Sharps, MD Department of neurosurgery  I spent a total of 40 minutes her care with her today, reviewing  her chart, reviewing her imaging, and coordinating her care going forward.

## 2024-10-14 ENCOUNTER — Ambulatory Visit: Attending: Student in an Organized Health Care Education/Training Program | Admitting: Nurse Practitioner

## 2024-10-14 ENCOUNTER — Encounter: Payer: Self-pay | Admitting: Nurse Practitioner

## 2024-10-14 VITALS — BP 139/93 | HR 78 | Temp 97.1°F | Resp 18 | Ht 69.0 in | Wt 350.0 lb

## 2024-10-14 DIAGNOSIS — G894 Chronic pain syndrome: Secondary | ICD-10-CM | POA: Insufficient documentation

## 2024-10-14 DIAGNOSIS — M5126 Other intervertebral disc displacement, lumbar region: Secondary | ICD-10-CM | POA: Insufficient documentation

## 2024-10-14 DIAGNOSIS — Z7189 Other specified counseling: Secondary | ICD-10-CM | POA: Diagnosis present

## 2024-10-14 DIAGNOSIS — Z0289 Encounter for other administrative examinations: Secondary | ICD-10-CM | POA: Insufficient documentation

## 2024-10-14 DIAGNOSIS — M5106 Intervertebral disc disorders with myelopathy, lumbar region: Secondary | ICD-10-CM | POA: Diagnosis present

## 2024-10-14 DIAGNOSIS — M5416 Radiculopathy, lumbar region: Secondary | ICD-10-CM | POA: Insufficient documentation

## 2024-10-14 MED ORDER — OXYCODONE HCL 5 MG PO TABS: 5.0000 mg | ORAL_TABLET | Freq: Four times a day (QID) | ORAL | 0 refills | Status: DC | PRN

## 2024-10-14 NOTE — Progress Notes (Signed)
 Safety precautions to be maintained throughout the outpatient stay will include: orient to surroundings, keep bed in low position, maintain call bell within reach at all times, provide assistance with transfer out of bed and ambulation.

## 2024-10-14 NOTE — Progress Notes (Signed)
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 that she has been smoking cigarettes. She has never used smokeless tobacco. 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 Family: family history is not  on file.  Past Surgical History:  Procedure Laterality Date   c section     DILATION AND CURETTAGE OF UTERUS     FOOT SURGERY Left 2010   KIDNEY SURGERY  04/1991   LUMBAR LAMINECTOMY/DECOMPRESSION MICRODISCECTOMY Left 04/29/2024   Procedure: LUMBAR LAMINECTOMY/DECOMPRESSION MICRODISCECTOMY 1 LEVEL;  Surgeon: Claudene Penne ORN, MD;  Location: [facility];  Service: Neurosurgery;  Laterality: Left;  LEFT L5-S1 MICRODISCECTOMY WITH POSSIBLE CONVERTION TO OPEN   LUMBAR LAMINECTOMY/DECOMPRESSION MICRODISCECTOMY Left 07/08/2024   Procedure: LUMBAR LAMINECTOMY/DECOMPRESSION MICRODISCECTOMY 1 LEVEL;  Surgeon: Claudene Penne ORN, MD;  Location: [facility];  Service: Neurosurgery;  Laterality: Left;  REDO LEFT L5-S1 DISCECTOMY   MYRINGOTOMY  1996   MYRINGOTOMY WITH TUBE PLACEMENT Bilateral 05/14/2023  Procedure: MYRINGOTOMY WITH TUBE PLACEMENT;  Surgeon: Blair Mt, MD;  Location: [facility];  Service: ENT;  Laterality: Bilateral;   WISDOM TOOTH EXTRACTION     at the age of 46   Active Ambulatory Problems    Diagnosis Date Noted   Acute bilateral low back pain 10/03/2021   Class 3 severe obesity due to excess calories with body mass index (BMI) of 45.0 to 49.9 in adult [facility]) 12/28/2019   Gastroesophageal reflux disease 07/03/2021   Healthcare maintenance 08/17/2018   History of ischemic stroke 11/02/2019   Hypertension 09/22/2011   Increased frequency of urination 07/03/2021   Nonintractable episodic headache 10/19/2019   Recurrent acute suppurative otitis media of right ear without spontaneous rupture of tympanic membrane 10/03/2015   Recurrent major depressive disorder, in partial remission 08/17/2018   Seizure disorder as sequela of cerebrovascular accident (HCC) 11/02/2019   Tobacco use disorder 10/22/2019   Lumbar radiculopathy 04/19/2024   Lumbar disk herniation, left, L5/S1, with weakness 04/19/2024   Recurrent herniation of lumbar disc 07/01/2024   Dysfunctional voiding of urine 09/15/2024    Resolved Ambulatory Problems    Diagnosis Date Noted   No Resolved Ambulatory Problems   Past Medical History:  Diagnosis Date   Anxiety    Arthritis    Chronic kidney disease    Complication of anesthesia    Depression    Fibromyalgia    GERD (gastroesophageal reflux disease)    Lumbar disc herniation    Lumbosacral radiculopathy    Migraines    Moderate persistent asthma without complication    Pneumonia    PTSD (post-traumatic stress disorder)    Seizures (HCC)    Sleep apnea    Stroke (HCC)    TIA (transient ischemic attack)    Constitutional Exam  General appearance: Well nourished, well developed, and well hydrated. In no apparent acute distress Vitals:   10/14/24 1123  BP: (!) 139/93  Pulse: 78  Resp: 18  Temp: (!) 97.1 F (36.2 C)  TempSrc: Temporal  SpO2: 99%  Weight: (!) 350 lb (158.8 kg)  Height: 5' 9 (1.753 m)   BMI Assessment: Estimated body mass index is 51.69 kg/m as calculated from the following:   Height as of this encounter: 5' 9 (1.753 m).   Weight as of this encounter: 350 lb (158.8 kg).  BMI interpretation table: BMI level Category Range association with higher incidence of chronic pain  <18 kg/m2 Underweight   18.5-24.9 kg/m2 Ideal body weight   25-29.9 kg/m2 Overweight Increased incidence by 20%  30-34.9 kg/m2 Obese (Class I) Increased incidence by 68%  35-39.9 kg/m2 Severe obesity (Class II) Increased incidence by 136%  >40 kg/m2 Extreme obesity (Class III) Increased incidence by 254%   Patient's current BMI Ideal Body weight  Body mass index is 51.69 kg/m. Ideal body weight: 66.2 kg (145 lb 15.1 oz) Adjusted ideal body weight: 103.2 kg (227 lb 9.1 oz)   BMI Readings from Last 4 Encounters:  10/14/24 51.69 kg/m  10/13/24 51.69 kg/m  09/21/24 47.99 kg/m  09/15/24 47.99 kg/m   Wt Readings from Last 4 Encounters:  10/14/24 (!) 350 lb (158.8 kg)  10/13/24 (!) 350 lb (158.8 kg)  09/21/24 (!) 325 lb (147.4 kg)  09/15/24  (!) 325 lb (147.4 kg)    Psych/Mental status: Alert, oriented x 3 (person, place, & time)       Eyes: PERLA Respiratory: No evidence of acute respiratory distress  Lumbar Exam  Skin & Axial Inspection: No masses,  redness, or swelling Alignment: Symmetrical Functional ROM: Pain restricted ROM affecting primarily the left Stability: No instability detected Muscle Tone/Strength: Functionally intact. No obvious neuro-muscular anomalies detected. Sensory (Neurological): Dermatomal pain pattern Palpation: No palpable anomalies       Provocative Tests: Hyperextension/rotation test: deferred today       Lumbar quadrant test (Kemp's test): deferred today       Lateral bending test: deferred today       Patrick's Maneuver: (+) for left-sided S-I arthralgia  FABER* test: (+) for left-sided S-I arthralgia  S-I anterior distraction/compression test: (+) Left-sided S-I arthralgia/arthropathy S-I lateral compression test: (+) Left-sided S-I arthralgia/arthropathy  *(Flexion, ABduction and External Rotation)  Assessment  Primary Diagnosis & Pertinent Problem List: The primary encounter diagnosis was Lumbar disk herniation, left, L5/S1, with weakness. Diagnoses of Recurrent herniation of lumbar disc, Lumbar radiculopathy, Chronic pain syndrome, Pain management contract signed, and Pain management contract discussed were also pertinent to this visit.  Visit Diagnosis (New problems to examiner): 1. Lumbar disk herniation, left, L5/S1, with weakness   2. Recurrent herniation of lumbar disc   3. Lumbar radiculopathy   4. Chronic pain syndrome   5. Pain management contract signed   6. Pain management contract discussed    Plan of Care (Initial workup plan)  Note: Ms. Branscom was reminded that as per protocol, today's visit has been an evaluation only. We have not taken over the patient's controlled substance management.  Problem-specific plan: Assessment and Plan    Bilateral low back pain with  left-sided sciatica, status post two lumbar microdiscectomies Chronic low back pain with left-sided sciatica persists post-microdiscectomies. Severe pain affects sleep and work, with sciatic nerve involvement. She is open to interventional treatments. - Consult Dr. Marcelino for L-TFEI joint injection with IV Versed . - Order SI joint injection if approved. - Provide pain medication if needed, based on interventional therapy decision. - Discuss and sign pain contract if interventional therapy not pursued; however, due to using illicit drug we are not able to provide any further medication management from pain clinic.     Lab Orders         Compliance Drug Analysis, Ur     Imaging Orders  No imaging studies ordered today   Referral Orders  No referral(s) requested today   Procedure Orders         Lumbar Transforaminal Epidural     Pharmacotherapy (current): Medications ordered:  Meds ordered this encounter  Medications   DISCONTD: oxyCODONE  (OXY IR/ROXICODONE ) 5 MG immediate release tablet    Sig: Take 1 tablet (5 mg total) by mouth every 6 (six) hours as needed for severe pain (pain score 7-10). Must last 30 days.    Dispense:  120 tablet    Refill:  0    Chronic Pain: STOP Act (Not applicable) Fill 1 day early if closed on refill date. Avoid benzodiazepines within 8 hours of opioids   Medications administered during this visit: Karen HERO. Mcgann had no medications administered during this visit.   Analgesic Pharmacotherapy:  Opioid Analgesics: For patients currently taking or requesting to take opioid analgesics, in accordance with   Medical Board Guidelines, we will assess their risks and indications for the use of these substances. After completing our evaluation, we may offer recommendations, but we no longer take patients for medication management. The prescribing physician will ultimately decide, based on his/her training and level of comfort whether to adopt any of  the recommendations, including whether or not to prescribe  such medicines.  Membrane stabilizer: To be determined at a later time  Muscle relaxant: To be determined at a later time  NSAID: To be determined at a later time  Other analgesic(s): To be determined at a later time   Interventional management options: Ms. Anthis was informed that there is no guarantee that she would be a candidate for interventional therapies. The decision will be based on the results of diagnostic studies, as well as Ms. Egner's risk profile.  Procedure(s) under consideration:  Pending results of ordered studies     Interventional Therapies  Risk Factors  Considerations  Medical Comorbidities:     Planned  Pending:      Under consideration:   Pending   Completed: (Analgesic benefit)1  None at this time   Therapeutic  Palliative (PRN) options:   None established   Completed by other providers:   None reported  1(Analgesic benefit): Expressed in percentage (%). (Local anesthetic[LA] +/- sedation  L.A.Local Anesthetic  Steroid benefit  Ongoing benefit)   Provider-requested follow-up: Return in about 2 weeks (around 10/28/2024) for (Clinic): (L) L-TFESI # 1 with IV versed  with Dr. Marcelino .  Future Appointments  Date Time Provider Department Center  11/17/2024 10:15 AM Georganne Penne SAUNDERS, MD [facility] None   I discussed the assessment and treatment plan with the patient. The patient was provided an opportunity to ask questions and all were answered. The patient agreed with the plan and demonstrated an understanding of the instructions.  Patient advised to call back or seek an in-person evaluation if the symptoms or condition worsens.  I personally spent a total of 45 minutes in the care of the patient today including preparing to see the patient, getting/reviewing separately obtained history, performing a medically appropriate exam/evaluation, counseling and educating, placing orders, referring  and communicating with other health care professionals, documenting clinical information in the EHR, independently interpreting results, communicating results, and coordinating care.   Note by: Sharis Keeran K Catalina Salasar, NP (TTS and AI technology used. I apologize for any typographical errors that were not detected and corrected.) Date: 10/14/2024; Time: 4:13 PM

## 2024-10-14 NOTE — Patient Instructions (Signed)
 Moderate Conscious Sedation, Adult Sedation is the use of medicines to help you relax and not feel pain. Moderate conscious sedation is a type of sedation that makes you less alert than normal. You are still able to respond to instructions, touch, or both. This type of sedation is used during short medical and dental procedures. It is milder than deep sedation, which is a type of sedation you cannot be easily woken up from. It is also milder than general anesthesia, which is the use of medicines to make you fall asleep. Moderate conscious sedation lets you return to your normal activities sooner. Tell a health care provider about: Any allergies you have. All medicines you are taking, including vitamins, herbs, steroids, eye drops, creams, and over-the-counter medicines. Any problems you or family members have had with anesthesia. Any bleeding problems you have. Any surgeries you have had. Any medical conditions you have. Whether you are pregnant or may be pregnant. Any recent alcohol, tobacco, or drug use. What are the risks? Your health care provider will talk with you about risks. These may include: Oversedation. This is when you get too much medicine. Nausea or vomiting. Allergic reaction to medicines. Trouble breathing. If this happens, a breathing tube may be used. It will be removed when you can breathe better on your own. Heart trouble. Lung trouble. Emergence delirium. This is when you feel confused while the sedation wears off. This gets better with time. What happens before the procedure? When to stop eating and drinking Follow instructions from your health care provider about what you may eat and drink. These may include: 8 hours before your procedure Stop eating most foods. Do not eat meat, fried foods, or fatty foods. Eat only light foods, such as toast or crackers. All liquids are okay except energy drinks and alcohol. 6 hours before your procedure Stop eating. Drink only  clear liquids, such as water, clear fruit juice, black coffee, plain tea, and sports drinks. Do not drink energy drinks or alcohol. 2 hours before your procedure Stop drinking all liquids. You may be allowed to take medicines with small sips of water. If you do not follow your health care provider's instructions, your procedure may be delayed or canceled. Medicines Ask your health care provider about: Changing or stopping your regular medicines. These include any diabetes medicines or blood thinners you take. Taking medicines such as aspirin and ibuprofen. These medicines can thin your blood. Do not take them unless your health care provider tells you to. Taking over-the-counter medicines, vitamins, herbs, and supplements. Tests and exams You may have an exam or testing. You may have a blood or urine sample taken. General instructions Do not use any products that contain nicotine or tobacco for at least 4 weeks before the procedure. These products include cigarettes, chewing tobacco, and vaping devices, such as e-cigarettes. If you need help quitting, ask your health care provider. If you will be going home right after the procedure, plan to have a responsible adult: Take you home from the hospital or clinic. You will not be allowed to drive. Care for you for the time you are told. What happens during the procedure?  You will be given the sedative. It may be given: As a pill you can take by mouth. It can also be put into the rectum. As a spray through the nose. As an injection into muscle. As an injection into a vein through an IV. You may be given oxygen as needed. Your blood pressure, heart  rate, breathing rate, and blood oxygen level will be monitored during the procedure. The medical or dental procedure will be done. The procedure may vary among health care providers and hospitals. What happens after the procedure? Your blood pressure, heart rate, breathing rate, and blood oxygen  level will be monitored until you leave the hospital or clinic. You will get fluids through an IV as needed. Do not drive or operate machinery until your health care provider says that it is safe. This information is not intended to replace advice given to you by your health care provider. Make sure you discuss any questions you have with your health care provider. Document Revised: 07/01/2022 Document Reviewed: 07/01/2022 [employer] Patient Education  2024 [employer]GENERAL RISKS AND COMPLICATIONS  What are the risk, side effects and possible complications? Generally speaking, most procedures are safe.  However, with any procedure there are risks, side effects, and the possibility of complications.  The risks and complications are dependent upon the sites that are lesioned, or the type of nerve block to be performed.  The closer the procedure is to the spine, the more serious the risks are.  Great care is taken when placing the radio frequency needles, block needles or lesioning probes, but sometimes complications can occur. Infection: Any time there is an injection through the skin, there is a risk of infection.  This is why sterile conditions are used for these blocks.  There are four possible types of infection. Localized skin infection. Central Nervous System Infection-This can be in the form of Meningitis, which can be deadly. Epidural Infections-This can be in the form of an epidural abscess, which can cause pressure inside of the spine, causing compression of the spinal cord with subsequent paralysis. This would require an emergency surgery to decompress, and there are no guarantees that the patient would recover from the paralysis. Discitis-This is an infection of the intervertebral discs.  It occurs in about 1% of discography procedures.  It is difficult to treat and it may lead to surgery.        2. Pain: the needles have to go through skin and soft tissues, will cause soreness.        3. Damage to internal structures:  The nerves to be lesioned may be near blood vessels or    other nerves which can be potentially damaged.       4. Bleeding: Bleeding is more common if the patient is taking blood thinners such as  aspirin, Coumadin, Ticiid, Plavix, etc., or if he/she have some genetic predisposition  such as hemophilia. Bleeding into the spinal canal can cause compression of the spinal  cord with subsequent paralysis.  This would require an emergency surgery to  decompress and there are no guarantees that the patient would recover from the  paralysis.       5. Pneumothorax:  Puncturing of a lung is a possibility, every time a needle is introduced in  the area of the chest or upper back.  Pneumothorax refers to free air around the  collapsed lung(s), inside of the thoracic cavity (chest cavity).  Another two possible  complications related to a similar event would include: Hemothorax and Chylothorax.   These are variations of the Pneumothorax, where instead of air around the collapsed  lung(s), you may have blood or chyle, respectively.       6. Spinal headaches: They may occur with any procedures in the area of the spine.       7.  Persistent CSF (Cerebro-Spinal Fluid) leakage: This is a rare problem, but may occur  with prolonged intrathecal or epidural catheters either due to the formation of a fistulous  track or a dural tear.       8. Nerve damage: By working so close to the spinal cord, there is always a possibility of  nerve damage, which could be as serious as a permanent spinal cord injury with  paralysis.       9. Death:  Although rare, severe deadly allergic reactions known as "Anaphylactic  reaction" can occur to any of the medications used.      10. Worsening of the symptoms:  We can always make thing worse.  What are the chances of something like this happening? Chances of any of this occuring are extremely low.  By statistics, you have more of a chance of getting killed in a  motor vehicle accident: while driving to the hospital than any of the above occurring .  Nevertheless, you should be aware that they are possibilities.  In general, it is similar to taking a shower.  Everybody knows that you can slip, hit your head and get killed.  Does that mean that you should not shower again?  Nevertheless always keep in mind that statistics do not mean anything if you happen to be on the wrong side of them.  Even if a procedure has a 1 (one) in a 1,000,000 (million) chance of going wrong, it you happen to be that one..Also, keep in mind that by statistics, you have more of a chance of having something go wrong when taking medications.  Who should not have this procedure? If you are on a blood thinning medication (e.g. Coumadin, Plavix, see list of "Blood Thinners"), or if you have an active infection going on, you should not have the procedure.  If you are taking any blood thinners, please inform your physician.  How should I prepare for this procedure? Do not eat or drink anything at least six hours prior to the procedure. Bring a driver with you .  It cannot be a taxi. Come accompanied by an adult that can drive you back, and that is strong enough to help you if your legs get weak or numb from the local anesthetic. Take all of your medicines the morning of the procedure with just enough water to swallow them. If you have diabetes, make sure that you are scheduled to have your procedure done first thing in the morning, whenever possible. If you have diabetes, take only half of your insulin dose and notify our nurse that you have done so as soon as you arrive at the clinic. If you are diabetic, but only take blood sugar pills (oral hypoglycemic), then do not take them on the morning of your procedure.  You may take them after you have had the procedure. Do not take aspirin or any aspirin-containing medications, at least eleven (11) days prior to the procedure.  They may prolong  bleeding. Wear loose fitting clothing that may be easy to take off and that you would not mind if it got stained with Betadine or blood. Do not wear any jewelry or perfume Remove any nail coloring.  It will interfere with some of our monitoring equipment.  NOTE: Remember that this is not meant to be interpreted as a complete list of all possible complications.  Unforeseen problems may occur.  BLOOD THINNERS The following drugs contain aspirin or other products, which can cause increased  bleeding during surgery and should not be taken for 2 weeks prior to and 1 week after surgery.  If you should need take something for relief of minor pain, you may take acetaminophen which is found in Tylenol,m Datril, Anacin-3 and Panadol. It is not blood thinner. The products listed below are.  Do not take any of the products listed below in addition to any listed on your instruction sheet.  A.P.C or A.P.C with Codeine Codeine Phosphate Capsules #3 Ibuprofen Ridaura  ABC compound Congesprin Imuran rimadil  Advil Cope Indocin Robaxisal  Alka-Seltzer Effervescent Pain Reliever and Antacid Coricidin or Coricidin-D  Indomethacin Rufen  Alka-Seltzer plus Cold Medicine Cosprin Ketoprofen S-A-C Tablets  Anacin Analgesic Tablets or Capsules Coumadin Korlgesic Salflex  Anacin Extra Strength Analgesic tablets or capsules CP-2 Tablets Lanoril Salicylate  Anaprox Cuprimine Capsules Levenox Salocol  Anexsia-D Dalteparin Magan Salsalate  Anodynos Darvon compound Magnesium Salicylate Sine-off  Ansaid Dasin Capsules Magsal Sodium Salicylate  Anturane Depen Capsules Marnal Soma  APF Arthritis pain formula Dewitt's Pills Measurin Stanback  Argesic Dia-Gesic Meclofenamic Sulfinpyrazone  Arthritis Bayer Timed Release Aspirin Diclofenac Meclomen Sulindac  Arthritis pain formula Anacin Dicumarol Medipren Supac  Analgesic (Safety coated) Arthralgen Diffunasal Mefanamic Suprofen  Arthritis Strength Bufferin Dihydrocodeine Mepro  Compound Suprol  Arthropan liquid Dopirydamole Methcarbomol with Aspirin Synalgos  ASA tablets/Enseals Disalcid Micrainin Tagament  Ascriptin Doan's Midol Talwin  Ascriptin A/D Dolene Mobidin Tanderil  Ascriptin Extra Strength Dolobid Moblgesic Ticlid  Ascriptin with Codeine Doloprin or Doloprin with Codeine Momentum Tolectin  Asperbuf Duoprin Mono-gesic Trendar  Aspergum Duradyne Motrin or Motrin IB Triminicin  Aspirin plain, buffered or enteric coated Durasal Myochrisine Trigesic  Aspirin Suppositories Easprin Nalfon Trillsate  Aspirin with Codeine Ecotrin Regular or Extra Strength Naprosyn Uracel  Atromid-S Efficin Naproxen Ursinus  Auranofin Capsules Elmiron Neocylate Vanquish  Axotal Emagrin Norgesic Verin  Azathioprine Empirin or Empirin with Codeine Normiflo Vitamin E  Azolid Emprazil Nuprin Voltaren  Bayer Aspirin plain, buffered or children's or timed BC Tablets or powders Encaprin Orgaran Warfarin Sodium  Buff-a-Comp Enoxaparin Orudis Zorpin  Buff-a-Comp with Codeine Equegesic Os-Cal-Gesic   Buffaprin Excedrin plain, buffered or Extra Strength Oxalid   Bufferin Arthritis Strength Feldene Oxphenbutazone   Bufferin plain or Extra Strength Feldene Capsules Oxycodone with Aspirin   Bufferin with Codeine Fenoprofen Fenoprofen Pabalate or Pabalate-SF   Buffets II Flogesic Panagesic   Buffinol plain or Extra Strength Florinal or Florinal with Codeine Panwarfarin   Buf-Tabs Flurbiprofen Penicillamine   Butalbital Compound Four-way cold tablets Penicillin   Butazolidin Fragmin Pepto-Bismol   Carbenicillin Geminisyn Percodan   Carna Arthritis Reliever Geopen Persantine   Carprofen Gold's salt Persistin   Chloramphenicol Goody's Phenylbutazone   Chloromycetin Haltrain Piroxlcam   Clmetidine heparin Plaquenil   Cllnoril Hyco-pap Ponstel   Clofibrate Hydroxy chloroquine Propoxyphen         Before stopping any of these medications, be sure to consult the physician who ordered them.   Some, such as Coumadin (Warfarin) are ordered to prevent or treat serious conditions such as "deep thrombosis", "pumonary embolisms", and other heart problems.  The amount of time that you may need off of the medication may also vary with the medication and the reason for which you were taking it.  If you are taking any of these medications, please make sure you notify your pain physician before you undergo any procedures.         Epidural Steroid Injection Patient Information  Description: The epidural space surrounds the nerves as they  exit the spinal cord.  In some patients, the nerves can be compressed and inflamed by a bulging disc or a tight spinal canal (spinal stenosis).  By injecting steroids into the epidural space, we can bring irritated nerves into direct contact with a potentially helpful medication.  These steroids act directly on the irritated nerves and can reduce swelling and inflammation which often leads to decreased pain.  Epidural steroids may be injected anywhere along the spine and from the neck to the low back depending upon the location of your pain.   After numbing the skin with local anesthetic (like Novocaine), a small needle is passed into the epidural space slowly.  You may experience a sensation of pressure while this is being done.  The entire block usually last less than 10 minutes.  Conditions which may be treated by epidural steroids:  Low back and leg pain Neck and arm pain Spinal stenosis Post-laminectomy syndrome Herpes zoster (shingles) pain Pain from compression fractures  Preparation for the injection:  Do not eat any solid food or dairy products within 8 hours of your appointment.  You may drink clear liquids up to 3 hours before appointment.  Clear liquids include water, black coffee, juice or soda.  No milk or cream please. You may take your regular medication, including pain medications, with a sip of water before your appointment  Diabetics  should hold regular insulin (if taken separately) and take 1/2 normal NPH dos the morning of the procedure.  Carry some sugar containing items with you to your appointment. A driver must accompany you and be prepared to drive you home after your procedure.  Bring all your current medications with your. An IV may be inserted and sedation may be given at the discretion of the physician.   A blood pressure cuff, EKG and other monitors will often be applied during the procedure.  Some patients may need to have extra oxygen administered for a short period. You will be asked to provide medical information, including your allergies, prior to the procedure.  We must know immediately if you are taking blood thinners (like Coumadin/Warfarin)  Or if you are allergic to IV iodine contrast (dye). We must know if you could possible be pregnant.  Possible side-effects: Bleeding from needle site Infection (rare, may require surgery) Nerve injury (rare) Numbness & tingling (temporary) Difficulty urinating (rare, temporary) Spinal headache ( a headache worse with upright posture) Light -headedness (temporary) Pain at injection site (several days) Decreased blood pressure (temporary) Weakness in arm/leg (temporary) Pressure sensation in back/neck (temporary)  Call if you experience: Fever/chills associated with headache or increased back/neck pain. Headache worsened by an upright position. New onset weakness or numbness of an extremity below the injection site Hives or difficulty breathing (go to the emergency room) Inflammation or drainage at the infection site Severe back/neck pain Any new symptoms which are concerning to you  Please note:  Although the local anesthetic injected can often make your back or neck feel good for several hours after the injection, the pain will likely return.  It takes 3-7 days for steroids to work in the epidural space.  You may not notice any pain relief for at least that  one week.  If effective, we will often do a series of three injections spaced 3-6 weeks apart to maximally decrease your pain.  After the initial series, we generally will wait several months before considering a repeat injection of the same type.  If you have any questions,  please call [phone] [facility]

## 2024-10-18 LAB — COMPLIANCE DRUG ANALYSIS, UR

## 2024-10-20 ENCOUNTER — Encounter: Payer: Self-pay | Admitting: Nurse Practitioner

## 2024-10-26 ENCOUNTER — Ambulatory Visit: Admitting: Student in an Organized Health Care Education/Training Program

## 2024-11-02 ENCOUNTER — Encounter: Payer: Self-pay | Admitting: Student in an Organized Health Care Education/Training Program

## 2024-11-02 ENCOUNTER — Encounter: Payer: Self-pay | Admitting: Neurology

## 2024-11-02 ENCOUNTER — Other Ambulatory Visit: Payer: Self-pay

## 2024-11-02 DIAGNOSIS — R202 Paresthesia of skin: Secondary | ICD-10-CM

## 2024-11-03 ENCOUNTER — Ambulatory Visit (HOSPITAL_BASED_OUTPATIENT_CLINIC_OR_DEPARTMENT_OTHER): Admitting: Student in an Organized Health Care Education/Training Program

## 2024-11-03 ENCOUNTER — Encounter: Payer: Self-pay | Admitting: Student in an Organized Health Care Education/Training Program

## 2024-11-03 ENCOUNTER — Ambulatory Visit
Admission: RE | Admit: 2024-11-03 | Discharge: 2024-11-03 | Disposition: A | Discharge status: Home / Self Care | Source: Ambulatory Visit | Attending: Student in an Organized Health Care Education/Training Program | Admitting: Student in an Organized Health Care Education/Training Program

## 2024-11-03 DIAGNOSIS — M5126 Other intervertebral disc displacement, lumbar region: Secondary | ICD-10-CM | POA: Diagnosis present

## 2024-11-03 DIAGNOSIS — M5416 Radiculopathy, lumbar region: Secondary | ICD-10-CM | POA: Insufficient documentation

## 2024-11-03 DIAGNOSIS — M5106 Intervertebral disc disorders with myelopathy, lumbar region: Secondary | ICD-10-CM | POA: Insufficient documentation

## 2024-11-03 MED ORDER — IOHEXOL 180 MG/ML  SOLN
10.0000 mL | Freq: Once | INTRAMUSCULAR | Status: AC
  Administered 2024-11-03: 10 mL via EPIDURAL

## 2024-11-03 MED ORDER — SODIUM CHLORIDE 0.9% FLUSH: 1.0000 mL | Freq: Once | INTRAVENOUS | Status: DC

## 2024-11-03 MED ORDER — LACTATED RINGERS IV SOLN: Freq: Once | INTRAVENOUS | Status: AC

## 2024-11-03 MED ORDER — ROPIVACAINE HCL 2 MG/ML IJ SOLN: 1.0000 mL | Freq: Once | INTRAMUSCULAR | Status: DC

## 2024-11-03 MED ORDER — LIDOCAINE HCL 2 % IJ SOLN
20.0000 mL | Freq: Once | INTRAMUSCULAR | Status: AC
  Administered 2024-11-03: 400 mg

## 2024-11-03 MED ORDER — SODIUM CHLORIDE 0.9% FLUSH
1.0000 mL | Freq: Once | INTRAVENOUS | Status: AC
  Administered 2024-11-03: 1 mL

## 2024-11-03 MED ORDER — MIDAZOLAM HCL (PF) 2 MG/2ML IJ SOLN
0.5000 mg | Freq: Once | INTRAMUSCULAR | Status: DC
  Administered 2024-11-03: 2 mg via INTRAVENOUS

## 2024-11-03 MED ORDER — SODIUM CHLORIDE (PF) 0.9 % IJ SOLN
INTRAMUSCULAR | Status: AC
  Filled 2024-11-03: qty 10

## 2024-11-03 MED ORDER — ROPIVACAINE HCL 2 MG/ML IJ SOLN
1.0000 mL | Freq: Once | INTRAMUSCULAR | Status: AC
  Administered 2024-11-03: 1 mL via EPIDURAL

## 2024-11-03 MED ORDER — IOHEXOL 180 MG/ML  SOLN
INTRAMUSCULAR | Status: AC
Start: 2024-11-03 — End: 2024-11-03
  Filled 2024-11-03: qty 20

## 2024-11-03 MED ORDER — ROPIVACAINE HCL 2 MG/ML IJ SOLN
INTRAMUSCULAR | Status: AC
  Filled 2024-11-03: qty 20

## 2024-11-03 MED ORDER — IOHEXOL 180 MG/ML  SOLN: 10.0000 mL | Freq: Once | INTRAMUSCULAR | Status: DC

## 2024-11-03 MED ORDER — DEXAMETHASONE SOD PHOSPHATE PF 10 MG/ML IJ SOLN: 10.0000 mg | Freq: Once | INTRAMUSCULAR | Status: DC

## 2024-11-03 MED ORDER — MIDAZOLAM HCL 2 MG/2ML IJ SOLN
INTRAMUSCULAR | Status: AC
  Filled 2024-11-03: qty 2

## 2024-11-03 MED ORDER — LIDOCAINE HCL 2 % IJ SOLN: 20.0000 mL | Freq: Once | INTRAMUSCULAR | Status: DC

## 2024-11-03 MED ORDER — MIDAZOLAM HCL (PF) 2 MG/2ML IJ SOLN
0.5000 mg | Freq: Once | INTRAMUSCULAR | Status: AC
  Administered 2024-11-03: 2 mg via INTRAVENOUS

## 2024-11-03 MED ORDER — LIDOCAINE HCL 2 % IJ SOLN
INTRAMUSCULAR | Status: AC
Start: 2024-11-03 — End: 2024-11-03
  Filled 2024-11-03: qty 20

## 2024-11-03 MED ORDER — DEXAMETHASONE SOD PHOSPHATE PF 10 MG/ML IJ SOLN
10.0000 mg | Freq: Once | INTRAMUSCULAR | Status: AC
  Administered 2024-11-03: 10 mg

## 2024-11-03 MED ORDER — LACTATED RINGERS IV SOLN: Freq: Once | INTRAVENOUS | Status: DC

## 2024-11-03 NOTE — Progress Notes (Signed)
 Safety precautions to be maintained throughout the outpatient stay will include: orient to surroundings, keep bed in low position, maintain call bell within reach at all times, provide assistance with transfer out of bed and ambulation.

## 2024-11-03 NOTE — Progress Notes (Signed)
 PROVIDER NOTE: Interpretation of information contained herein should be left to medically-trained personnel. Specific patient instructions are provided elsewhere under Patient Instructions section of medical record. This document was created in part using STT-dictation technology, any transcriptional errors that may result from this process are unintentional.  Patient: Karen Hunt Type: Established DOB: April 11, 1991 MRN: [id number] PCP: Claudene Elsie NOVAK, MD  Service: Procedure DOS: 11/03/2024 Setting: Ambulatory Location: Ambulatory outpatient facility Delivery: Face-to-face Provider: Wallie Sherry, MD Specialty: Interventional Pain Management Specialty designation: 09 Location: Outpatient facility Ref. Prov.: Patel, Seema K, NP       Interventional Therapy   Procedure: Lumbar trans-foraminal epidural steroid injection (L-TFESI) #1  Laterality: Left (-LT)  Level: S1 nerve root(s) Imaging: Fluoroscopy-guided         Anesthesia: Local anesthesia (1-2% Lidocaine ) Sedation: Minimal Sedation                       DOS: 11/03/2024  Performed by: Wallie Sherry, MD  Purpose: Diagnostic/Therapeutic Indications: Lumbar radicular pain severe enough to impact quality of life or function. 1. Lumbar disk herniation, left, L5/S1, with weakness   2. Recurrent herniation of lumbar disc   3. Lumbar radiculopathy    NAS-11 Pain score:   Pre-procedure: 8 /10   Post-procedure: 8 /10     Position / Prep / Materials:  Position: Prone  Prep solution: ChloraPrep (2% chlorhexidine  gluconate and 70% isopropyl alcohol) Prep Area: Entire Posterior Lumbosacral Area.  From the lower tip of the scapula down to the tailbone and from flank to flank. Materials:  Tray: Block Needle(s):  Type: Spinal  Gauge (G): 22  Length: 5-in  Qty: 1     H&P (Pre-op Assessment):  Karen Hunt is a 33 y.o. (year old), female patient, seen today for interventional treatment. She  has a past surgical history that includes  Kidney surgery (04/1991); Foot surgery (Left, 2010); c section; Myringotomy (1996); Wisdom tooth extraction; Myringotomy with tube placement (Bilateral, 05/14/2023); Dilation and curettage of uterus; Lumbar laminectomy/decompression microdiscectomy (Left, 04/29/2024); and Lumbar laminectomy/decompression microdiscectomy (Left, 07/08/2024). Karen Hunt has a current medication list which includes the following prescription(s): acetaminophen , aimovig, ascorbic acid, atorvastatin, baclofen , clobetasol ointment, cyanocobalamin, docusate sodium , escitalopram, famotidine , gabapentin , hydroxyzine, ketoconazole, ketoconazole, levonorgestrel, magnesium oxide, metformin, metronidazole , nurtec, oxycodone , promethazine , senna, symbicort, and ventolin  hfa, and the following Facility-Administered Medications: dexamethasone , lactated ringers , lidocaine , midazolam  pf, ropivacaine (pf) 2 mg/ml (0.2%), and sodium chloride  flush. 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   Weight as of this encounter: 350 lb (158.8 kg).  Risk Assessment: Allergies: Reviewed. She is allergic to ciprofloxacin .  Allergy Precautions: None required Coagulopathies: Reviewed. None identified.  Blood-thinner therapy: None at this time Active Infection(s): Reviewed. None identified. Karen Hunt is afebrile  Site Confirmation: Karen Hunt was asked to confirm the procedure and laterality before marking the site Procedure checklist: Completed Consent: Before the procedure and under the influence of no sedative(s), amnesic(s), or anxiolytics, the patient was informed of the treatment options, risks  and possible complications. To fulfill our ethical and legal obligations, as recommended by the American Medical Association's Code  of Ethics, I have informed the patient of my clinical impression; the nature and purpose of the treatment or procedure; the risks, benefits, and possible complications of the intervention; the alternatives, including doing nothing; the risk(s) and benefit(s) of the alternative treatment(s) or procedure(s); and the risk(s) and benefit(s) of doing nothing. The patient was provided information about the general risks and possible complications associated with the procedure. These may include, but are not limited to: failure to achieve desired goals, infection, bleeding, organ or nerve damage, allergic reactions, paralysis, and death. In addition, the patient was informed of those risks and complications associated to Spine-related procedures, such as failure to decrease pain; infection (i.e.: Meningitis, epidural or intraspinal abscess); bleeding (i.e.: epidural hematoma, subarachnoid hemorrhage, or any other type of intraspinal or peri-dural bleeding); organ or nerve damage (i.e.: Any type of peripheral nerve, nerve root, or spinal cord injury) with subsequent damage to sensory, motor, and/or autonomic systems, resulting in permanent pain, numbness, and/or weakness of one or several areas of the body; allergic reactions; (i.e.: anaphylactic reaction); and/or death. Furthermore, the patient was informed of those risks and complications associated with the medications. These include, but are not limited to: allergic reactions (i.e.: anaphylactic or anaphylactoid reaction(s)); adrenal axis suppression; blood sugar elevation that in diabetics may result in ketoacidosis or comma; water retention that in patients with history of congestive heart failure may result in shortness of breath, pulmonary edema, and decompensation with resultant heart failure; weight gain; swelling or edema;  medication-induced neural toxicity; particulate matter embolism and blood vessel occlusion with resultant organ, and/or nervous system infarction; and/or aseptic necrosis of one or more joints. Finally, the patient was informed that Medicine is not an exact science; therefore, there is also the possibility of unforeseen or unpredictable risks and/or possible complications that may result in a catastrophic outcome. The patient indicated having understood very clearly. We have given the patient no guarantees and we have made no promises. Enough time was given to the patient to ask questions, all of which were answered to the patient's satisfaction. Ms. Neira has indicated that she wanted to continue with the procedure. Attestation: I, the ordering provider, attest that I have discussed with the patient the benefits, risks, side-effects, alternatives, likelihood of achieving goals, and potential problems during recovery for the procedure that I have provided informed consent. Date  Time: 11/03/2024  9:59 AM  Pre-Procedure Preparation:  Monitoring: As per clinic protocol. Respiration, ETCO2, SpO2, BP, heart rate and rhythm monitor placed and checked for adequate function Safety Precautions: Patient was assessed for positional comfort and pressure points before starting the procedure. Time-out: I initiated and conducted the Time-out before starting the procedure, as per protocol. The patient was asked to participate by confirming the accuracy of the Time Out information. Verification of the correct person, site, and procedure were performed and confirmed by me, the nursing staff, and the patient. Time-out conducted as per Joint Commission's Universal Protocol (UP.01.01.01). Time: 1042 Start Time: 1042 hrs.  Description/Narrative of Procedure:          Target: The 6 o'clock position under the pedicle, on the affected side. Region: Posterolateral Lumbosacral Approach: Posterior Percutaneous  Paravertebral approach.  Rationale (medical necessity): procedure needed and proper for the diagnosis and/or treatment of the patient's medical symptoms and needs. Procedural Technique Safety Precautions: Aspiration looking for blood return was conducted prior to all injections. At no point did we inject any substances, as a needle was being advanced. No attempts were made at seeking  any paresthesias. Safe injection practices and needle disposal techniques used. Medications properly checked for expiration dates. SDV (single dose vial) medications used. Description of the Procedure: Protocol guidelines were followed. The patient was placed in position over the procedure table. The target area was identified and the area prepped in the usual manner. Skin & deeper tissues infiltrated with local anesthetic. Appropriate amount of time allowed to pass for local anesthetics to take effect. The procedure needles were then advanced to the target area. Proper needle placement secured. Negative aspiration confirmed. Solution injected in intermittent fashion, asking for systemic symptoms every 0.5cc of injectate. The needles were then removed and the area cleansed, making sure to leave some of the prepping solution back to take advantage of its long term bactericidal properties.  3 cc solution made of 1 cc of preservative-free saline, 1 cc of 0.2% ropivacaine, 1 cc of Decadron  10 mg/cc.   Vitals:   11/03/24 1035 11/03/24 1040 11/03/24 1045 11/03/24 1051  BP: (!) 151/90 (!) 140/94 (!) 149/96 (!) 143/91  Pulse:      Resp: 16 17 18 18   Temp:      SpO2: 97% 99% 98% 99%  Weight:      Height:        Start Time: 1042 hrs. End Time: 1045 hrs.  Imaging Guidance (Spinal):          Type of Imaging Technique: Fluoroscopy Guidance (Spinal) Indication(s): Fluoroscopy guidance for needle placement to enhance accuracy in procedures requiring precise needle localization for targeted delivery of medication in or near  specific anatomical locations not easily accessible without such real-time imaging assistance. Exposure Time: Please see nurses notes. Contrast: Before injecting any contrast, we confirmed that the patient did not have an allergy to iodine, shellfish, or radiological contrast. Once satisfactory needle placement was completed at the desired level, radiological contrast was injected. Contrast injected under live fluoroscopy. No contrast complications. See chart for type and volume of contrast used. Fluoroscopic Guidance: I was personally present during the use of fluoroscopy. Tunnel Vision Technique used to obtain the best possible view of the target area. Parallax error corrected before commencing the procedure. Direction-depth-direction technique used to introduce the needle under continuous pulsed fluoroscopy. Once target was reached, antero-posterior, oblique, and lateral fluoroscopic projection used confirm needle placement in all planes. Images permanently stored in EMR. Interpretation: I personally interpreted the imaging intraoperatively. Adequate needle placement confirmed in multiple planes. Appropriate spread of contrast into desired area was observed. No evidence of afferent or efferent intravascular uptake. No intrathecal or subarachnoid spread observed. Permanent images saved into the patient's record.  Post-operative Assessment:  Post-procedure Vital Signs:  Pulse/HCG Rate: 7872 Temp: 98.1 F (36.7 C) Resp: 18 BP: (!) 143/91 SpO2: 99 %  EBL: None  Complications: No immediate post-treatment complications observed by team, or reported by patient.  Note: The patient tolerated the entire procedure well. A repeat set of vitals were taken after the procedure and the patient was kept under observation following institutional policy, for this type of procedure. Post-procedural neurological assessment was performed, showing return to baseline, prior to discharge. The patient was provided  with post-procedure discharge instructions, including a section on how to identify potential problems. Should any problems arise concerning this procedure, the patient was given instructions to immediately contact us , at any time, without hesitation. In any case, we plan to contact the patient by telephone for a follow-up status report regarding this interventional procedure.  Comments:  No additional relevant information.  Plan  of Care (POC)  Orders:  Orders Placed This Encounter  Procedures   DG PAIN CLIN[facility] C-ARM 1-60 MIN NO REPORT    Intraoperative interpretation by procedural physician at [facility].    Standing Status:   Standing    Number of Occurrences:   1    Reason for exam::   Assistance in needle guidance and placement for procedures requiring needle placement in or near specific anatomical locations not easily accessible without such assistance.     Medications ordered for procedure: Meds ordered this encounter  Medications   DISCONTD: iohexol (OMNIPAQUE) 180 MG/ML injection 10 mL    Must be Myelogram-compatible. If not available, you may substitute with a water-soluble, non-ionic, hypoallergenic, myelogram-compatible radiological contrast medium.   lidocaine  (XYLOCAINE ) 2 % (with pres) injection 400 mg   DISCONTD: lactated ringers  infusion   DISCONTD: midazolam  PF (VERSED ) injection 0.5-2 mg    Make sure Flumazenil is available in the pyxis when using this medication. If oversedation occurs, administer 0.2 mg IV over 15 sec. If after 45 sec no response, administer 0.2 mg again over 1 min; may repeat at 1 min intervals; not to exceed 4 doses (1 mg)   DISCONTD: ropivacaine (PF) 2 mg/mL (0.2%) (NAROPIN) injection 1 mL   DISCONTD: sodium chloride  flush (NS) 0.9 % injection 1 mL   DISCONTD: dexamethasone  (DECADRON ) injection 10 mg   iohexol (OMNIPAQUE) 180 MG/ML injection 10 mL    Must be Myelogram-compatible. If not available, you may substitute with a water-soluble,  non-ionic, hypoallergenic, myelogram-compatible radiological contrast medium.   DISCONTD: lidocaine  (XYLOCAINE ) 2 % (with pres) injection 400 mg   lactated ringers  infusion   midazolam  PF (VERSED ) injection 0.5-2 mg    Make sure Flumazenil is available in the pyxis when using this medication. If oversedation occurs, administer 0.2 mg IV over 15 sec. If after 45 sec no response, administer 0.2 mg again over 1 min; may repeat at 1 min intervals; not to exceed 4 doses (1 mg)   sodium chloride  flush (NS) 0.9 % injection 1 mL   ropivacaine (PF) 2 mg/mL (0.2%) (NAROPIN) injection 1 mL   dexamethasone  (DECADRON ) injection 10 mg   Medications administered: We administered iohexol.  See the medical record for exact dosing, route, and time of administration.    Left S1 TF ESI 11/03/24    Follow-up plan:   Return in about 4 weeks (around 12/01/2024) for PPE, F2F, Seema.     Recent Visits Date Type Provider Dept  10/14/24 Office Visit Patel, Seema K, NP [facility]  Showing recent visits within past 90 days and meeting all other requirements Today's Visits Date Type Provider Dept  11/03/24 Procedure visit Marcelino Nurse, MD [facility]  Showing today's visits and meeting all other requirements Future Appointments Date Type Provider Dept  11/30/24 Appointment Patel, Seema K, NP [facility]  Showing future appointments within next 90 days and meeting all other requirements   Disposition: Discharge home  Discharge (Date  Time): 11/03/2024; 1055 hrs.   Primary Care Physician: Claudene Elsie NOVAK, MD Location: [facility] Note by: Nurse Marcelino, MD (TTS technology used. I apologize for any typographical errors that were not detected and corrected.) Date: 11/03/2024; Time: 11:02 AM  Disclaimer:  Medicine is not an visual merchandiser. The only guarantee in medicine is that nothing is guaranteed. It is important to note that the decision to proceed  with this intervention was based on the information collected from the  patient. The Data and conclusions were drawn from the patient's questionnaire, the interview, and the physical examination. Because the information was provided in large part by the patient, it cannot be guaranteed that it has not been purposely or unconsciously manipulated. Every effort has been made to obtain as much relevant data as possible for this evaluation. It is important to note that the conclusions that lead to this procedure are derived in large part from the available data. Always take into account that the treatment will also be dependent on availability of resources and existing treatment guidelines, considered by other Pain Management Practitioners as being common knowledge and practice, at the time of the intervention. For Medico-Legal purposes, it is also important to point out that variation in procedural techniques and pharmacological choices are the acceptable norm. The indications, contraindications, technique, and results of the above procedure should only be interpreted and judged by a Board-Certified Interventional Pain Specialist with extensive familiarity and expertise in the same exact procedure and technique.

## 2024-11-03 NOTE — Patient Instructions (Signed)

## 2024-11-04 ENCOUNTER — Telehealth: Payer: Self-pay | Admitting: *Deleted

## 2024-11-04 NOTE — Telephone Encounter (Signed)
 No problems post procedure.

## 2024-11-08 ENCOUNTER — Encounter: Payer: Self-pay | Admitting: Student in an Organized Health Care Education/Training Program

## 2024-11-08 ENCOUNTER — Telehealth: Payer: Self-pay

## 2024-11-08 NOTE — Telephone Encounter (Signed)
 Returned patient call.  She states that she is having excruciating stabbing pain just above her old incision site.  This is a new pain since the procedure. (TFESI) She is still having the same pain as before the procedue.  Denies fever, redness, or any other symptoms. I told her that she needed to give  the steroid a chance to start working.   Do you have any thing else to add?

## 2024-11-09 NOTE — Progress Notes (Unsigned)
 PROVIDER NOTE: Interpretation of information contained herein should be left to medically-trained personnel. Specific patient instructions are provided elsewhere under Patient Instructions section of medical record. This document was created in part using AI and STT-dictation technology, any transcriptional errors that may result from this process are unintentional.  Patient: Karen Hunt  Service: E/M   PCP: Claudene Elsie NOVAK, MD  DOB: January 29, 1991  DOS: 11/10/2024  Provider: Emmy MARLA Blanch, NP  MRN: [id number]  Delivery: Face-to-face  Specialty: Interventional Pain Management  Type: Established Patient  Setting: Ambulatory outpatient facility  Specialty designation: 09  Referring Prov.: Claudene Elsie NOVAK, MD  Location: Outpatient office facility       History of present illness (HPI) [name], a 33 y.o. year old female, is here today because of her No primary diagnosis found.. Karen Hunt primary complain today is No chief complaint on file.  Pertinent problems: Karen Hunt does not have any pertinent problems on file.  Pain Assessment: Severity of   is reported as a  /10. Location:    / . Onset:  . Quality:  . Timing:  . Modifying factor(s):  SABRA Vitals:  vitals were not taken for this visit.  BMI: Estimated body mass index is 51.69 kg/m as calculated from the following:   Height as of 11/03/24: 5' 9 (1.753 m).   Weight as of 11/03/24: 350 lb (158.8 kg).  Last encounter: 10/14/2024. Last procedure: Visit date not found.  Reason for encounter: post-procedure evaluation and assessment.   Discussed the use of AI scribe software for clinical note transcription with the patient, who gave verbal consent to proceed.  History of Present Illness         Procedure Procedure: Lumbar trans-foraminal epidural steroid injection (L-TFESI) #1  Laterality: Left (-LT)  Level: S1 nerve root(s) Imaging: Fluoroscopy-guided         Anesthesia: Local anesthesia (1-2% Lidocaine ) Sedation:  Minimal Sedation                       DOS: 11/03/2024  Performed by: Wallie Sherry, MD   Purpose: Diagnostic/Therapeutic Indications: Lumbar radicular pain severe enough to impact quality of life or function. 1. Lumbar disk herniation, left, L5/S1, with weakness   2. Recurrent herniation of lumbar disc   3. Lumbar radiculopathy     NAS-11 Pain score:        Pre-procedure: 8 /10        Post-procedure: 8 /10   Post-Procedure Evaluation    Effectiveness:  Initial hour after procedure:   ***. Subsequent 4-6 hours post-procedure:   ***. Analgesia past initial 6 hours:   ***. Ongoing improvement:  Analgesic:  *** Function: {Blank single:19197::No benefit,No improvement,Back to baseline,Transient improvement,Karen Hunt reports improvement in function,Somewhat improved,Minimal improvement,   ***   } ROM: {Blank single:19197::No benefit,No improvement,Back to baseline,Transient improvement,Karen Hunt reports improvement in ROM,Somewhat improved,Minimal improvement,   ***   }   Pharmacotherapy Assessment   Opioid Analgesic:   MME/day: Monitoring: [place] PMP: PDMP not reviewed this encounter.       Pharmacotherapy: No side-effects or adverse reactions reported. Compliance: No problems identified. Effectiveness: Clinically acceptable.  No notes on file  UDS:  Summary  Date Value Ref Range Status  10/14/2024 FINAL  Final    Comment:    ==================================================================== Compliance Drug Analysis, Ur ==================================================================== Test  Result       Flag       Units  Drug Present and Declared for Prescription Verification   Oxycodone                       221          EXPECTED   ng/mg creat   Oxymorphone                    1615         EXPECTED   ng/mg creat   Noroxycodone                   1921         EXPECTED   ng/mg creat   Noroxymorphone                  405          EXPECTED   ng/mg creat    Sources of oxycodone  are scheduled prescription medications.    Oxymorphone, noroxycodone, and noroxymorphone are expected    metabolites of oxycodone . Oxymorphone is also available as a    scheduled prescription medication.    Gabapentin                      PRESENT      EXPECTED   Baclofen                        PRESENT      EXPECTED   Citalopram                     PRESENT      EXPECTED   Desmethylcitalopram            PRESENT      EXPECTED    Desmethylcitalopram is an expected metabolite of citalopram or the    enantiomeric form, escitalopram.    Acetaminophen                   PRESENT      EXPECTED  Drug Present not Declared for Prescription Verification   Carboxy-THC                    360          UNEXPECTED ng/mg creat    Carboxy-THC is a metabolite of tetrahydrocannabinol (THC). Source of    THC is most commonly herbal marijuana or marijuana-based products,    but THC is also present in a scheduled prescription medication.    Trace amounts of THC can be present in hemp and cannabidiol (CBD)    products. This test is not intended to distinguish between delta-9-    tetrahydrocannabinol, the predominant form of THC in most herbal or    marijuana-based products, and delta-8-tetrahydrocannabinol.    Diphenhydramine                 PRESENT      UNEXPECTED  Drug Absent but Declared for Prescription Verification   Hydroxyzine                    Not Detected UNEXPECTED   Promethazine                    Not Detected UNEXPECTED ==================================================================== Test                      Result  Flag   Units      Ref Range   Creatinine              110              mg/dL      >=79 ==================================================================== Declared Medications:  The flagging and interpretation on this report are based on the  following declared medications.  Unexpected results may arise from   inaccuracies in the declared medications.   **Note: The testing scope of this panel includes these medications:   Baclofen  (Lioresal )  Escitalopram (Lexapro)  Gabapentin  (Neurontin )  Hydroxyzine (Atarax)  Oxycodone  (Roxicodone )  Promethazine  (Phenergan )   **Note: The testing scope of this panel does not include small to  moderate amounts of these reported medications:   Acetaminophen  (Tylenol )   **Note: The testing scope of this panel does not include the  following reported medications:   Albuterol  (Ventolin  HFA)  Atorvastatin (Lipitor)  Budesonide (Symbicort)  Clobetasol (Temovate)  Docusate (Colace)  Erenumab (Aimovig)  Famotidine  (Pepcid )  Formoterol (Symbicort)  Ketoconazole (Nizoral)  Levonorgestrel (Mirena)  Magnesium (Mag-Ox)  Metformin (Glucophage)  Metronidazole  (MetroGel )  Rimegepant (Nurtec)  Sennosides (Senokot)  Vitamin B12  Vitamin C ==================================================================== For clinical consultation, please call [phone]. ====================================================================     No results found for: CBDTHCR No results found for: D8THCCBX No results found for: D9THCCBX  ROS  Constitutional: Denies any fever or chills Gastrointestinal: No reported hemesis, hematochezia, vomiting, or acute GI distress Musculoskeletal: Denies any acute onset joint swelling, redness, loss of ROM, or weakness Neurological: No reported episodes of acute onset apraxia, aphasia, dysarthria, agnosia, amnesia, paralysis, loss of coordination, or loss of consciousness  Medication Review  Erenumab-aooe, Rimegepant Sulfate, acetaminophen , albuterol , ascorbic acid, atorvastatin, baclofen , budesonide-formoterol, clobetasol ointment, cyanocobalamin, docusate sodium , escitalopram, famotidine , gabapentin , hydrOXYzine, ketoconazole, levonorgestrel, magnesium oxide, metFORMIN, metroNIDAZOLE , oxyCODONE , promethazine , and  senna  History Review  Allergy: Karen Hunt is allergic to ciprofloxacin . Drug: Karen Hunt  reports current drug use. Drug: Marijuana. Alcohol:  reports current alcohol use. Tobacco:  reports that she has been smoking cigarettes. She has never used smokeless tobacco. Social: Karen Hunt  reports that she has been smoking cigarettes. She has never used smokeless tobacco. She reports current alcohol use. She reports current drug use. Drug: Marijuana. 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 Surgical: Karen Hunt  has a past surgical history that includes Kidney surgery (04/1991); Foot surgery (Left, 2010); c section; Myringotomy (1996); Wisdom tooth extraction; Myringotomy with tube placement (Bilateral, 05/14/2023); Dilation and curettage of uterus; Lumbar laminectomy/decompression microdiscectomy (Left, 04/29/2024); and Lumbar laminectomy/decompression microdiscectomy (Left, 07/08/2024). Family: family history is not on file.  Laboratory Chemistry Profile   Renal Lab Results  Component Value Date   BUN 10 07/07/2024   CREATININE 0.91 07/07/2024   GFRAA >60 08/25/2020   GFRNONAA >60 07/07/2024    Hepatic Lab Results  Component Value Date   AST 16 03/30/2024   ALT 18 03/30/2024   ALBUMIN 3.6 03/30/2024   ALKPHOS 35 (L) 03/30/2024   LIPASE 20 02/08/2016    Electrolytes Lab Results  Component Value Date   NA 140 07/07/2024   K 3.6 07/07/2024   CL 109 07/07/2024  CALCIUM 9.1 07/07/2024   MG 2.3 06/09/2022     Bone No results found for: VD25OH, VD125OH2TOT, CI6874NY7, CI7874NY7, 25OHVITD1, 25OHVITD2, 25OHVITD3, TESTOFREE, TESTOSTERONE  Inflammation (CRP: Acute Phase) (ESR: Chronic Phase) No results found for: CRP, ESRSEDRATE, LATICACIDVEN       Note: Above Lab results reviewed.  Recent Imaging Review  DG PAIN CLINIC C-ARM 1-60 MIN NO REPORT Fluoro was used, but no Radiologist interpretation will be provided.  Please refer to NOTES tab for provider progress note. Note: Reviewed        Physical Exam  Vitals: There were no vitals taken for this visit. BMI: Estimated body mass index is 51.69 kg/m as calculated from the following:   Height as of 11/03/24: 5' 9 (1.753 m).   Weight as of 11/03/24: 350 lb (158.8 kg). Ideal: Ideal body weight: 66.2 kg (145 lb 15.1 oz) Adjusted ideal body weight: 103.2 kg (227 lb 9.1 oz) General appearance: Well nourished, well developed, and well hydrated. In no apparent acute distress Mental status: Alert, oriented x 3 (person, place, & time)       Respiratory: No evidence of acute respiratory distress Eyes: PERLA   Assessment   Diagnosis Status  No diagnosis found. Controlled Controlled Controlled   Updated Problems: No problems updated.  Plan of Care  Problem-specific:  Assessment and Plan            Karen Hunt has a current medication list which includes the following long-term medication(s): atorvastatin, escitalopram, gabapentin , levonorgestrel, metformin, promethazine , and symbicort.  Pharmacotherapy (Medications Ordered): No orders of the defined types were placed in this encounter.  Orders:  No orders of the defined types were placed in this encounter.    Interventional Therapies  Risk Factors  Considerations  Medical Comorbidities:     Planned  Pending:      Under consideration:   Pending   Completed: (Analgesic benefit)1  None at this time   Therapeutic  Palliative (PRN) options:    None established   Completed by other providers:   None reported  1(Analgesic benefit): Expressed in percentage (%). (Local anesthetic[LA] +/- sedation  L.A.Local Anesthetic  Steroid benefit  Ongoing benefit)   No follow-ups on file.    Recent Visits Date Type Provider Dept  11/03/24 Procedure visit Marcelino Nurse, MD [facility]  10/14/24 Office Visit Brendyn Mclaren K, NP [facility]  Showing recent visits within past 90 days and meeting all other requirements Future Appointments Date Type Provider Dept  11/10/24 Appointment Jamiah Recore K, NP [facility]  Showing future appointments within next 90 days and meeting all other requirements  I discussed the assessment and treatment plan with the patient. The patient was provided an opportunity to ask questions and all were answered. The patient agreed with the plan and demonstrated an understanding of the instructions.  Patient advised to call back or seek an in-person evaluation if the symptoms or condition worsens.  Duration of encounter: *** minutes.  Total time on encounter, as per AMA guidelines included both the face-to-face and non-face-to-face time personally spent by the physician and/or other qualified health care professional(s) on the day of the encounter (includes time in activities that require the physician or other qualified health care professional and does not include time in activities normally performed by clinical staff). Physician's time may include the following activities when performed: Preparing to see the patient (e.g., pre-charting review of records, searching for previously ordered imaging, lab work, and nerve conduction  tests) Review of prior analgesic pharmacotherapies. Reviewing PMP Interpreting ordered tests (e.g., lab work, imaging, nerve conduction tests) Performing post-procedure evaluations, including interpretation of diagnostic procedures Obtaining and/or reviewing  separately obtained history Performing a medically appropriate examination and/or evaluation Counseling and educating the patient/family/caregiver Ordering medications, tests, or procedures Referring and communicating with other health care professionals (when not separately reported) Documenting clinical information in the electronic or other health record Independently interpreting results (not separately reported) and communicating results to the patient/ family/caregiver Care coordination (not separately reported)  Note by: Amayiah Gosnell K Tequila Rottmann, NP (TTS and AI technology used. I apologize for any typographical errors that were not detected and corrected.) Date: 11/10/2024; Time: 3:14 PM

## 2024-11-10 ENCOUNTER — Ambulatory Visit: Attending: Nurse Practitioner | Admitting: Nurse Practitioner

## 2024-11-10 ENCOUNTER — Encounter: Payer: Self-pay | Admitting: Nurse Practitioner

## 2024-11-10 VITALS — BP 138/91 | HR 65 | Temp 97.3°F | Resp 20 | Ht 69.0 in | Wt 350.0 lb

## 2024-11-10 DIAGNOSIS — G894 Chronic pain syndrome: Secondary | ICD-10-CM | POA: Diagnosis present

## 2024-11-10 DIAGNOSIS — M5416 Radiculopathy, lumbar region: Secondary | ICD-10-CM | POA: Insufficient documentation

## 2024-11-10 DIAGNOSIS — M5126 Other intervertebral disc displacement, lumbar region: Secondary | ICD-10-CM | POA: Insufficient documentation

## 2024-11-10 DIAGNOSIS — M5106 Intervertebral disc disorders with myelopathy, lumbar region: Secondary | ICD-10-CM | POA: Diagnosis not present

## 2024-11-10 MED ORDER — KETOROLAC TROMETHAMINE 60 MG/2ML IM SOLN
60.0000 mg | Freq: Once | INTRAMUSCULAR | Status: AC
  Administered 2024-11-10: 60 mg via INTRAMUSCULAR
  Filled 2024-11-10: qty 2

## 2024-11-10 MED ORDER — PREDNISONE 20 MG PO TABS: ORAL_TABLET | ORAL | 0 refills | Status: AC

## 2024-11-10 MED ORDER — METHOCARBAMOL 1000 MG/10ML IJ SOLN
200.0000 mg | Freq: Once | INTRAMUSCULAR | Status: AC
  Administered 2024-11-10: 200 mg via INTRAMUSCULAR
  Filled 2024-11-10: qty 10

## 2024-11-11 NOTE — Assessment & Plan Note (Deleted)
 Subacute dysfunctional voiding Subjective incomplete emptying, hesitancy, intermittency  Began following recent lumbar back surgeries, ongoing radiculopathies  Hx of solitary Left kidney   RBUS (09/23/24) - solitary Left kidney, unremarkable US 

## 2024-11-11 NOTE — Progress Notes (Deleted)
   11/17/2024 4:14 PM   Karen Hunt Feb 19, 1991 [id number]  Reason for visit: Follow up RBUS   HPI: 33 y.o. 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   Constitutional:  Alert and oriented, No acute distress.  Laboratory Data: N/A  Pertinent Imaging: I have personally viewed and interpreted the RBUS (09/23/24) - solitary Left kidney, unremarkable US   IMPRESSION: 1. Prior right nephrectomy. 2. Compensatory hypertrophy of the left kidney. No hydronephrosis. 3. Prominent postvoid residual of 30 cc.    Assessment & Plan:    Dysfunctional voiding of urine Assessment & Plan: Subacute dysfunctional voiding Subjective incomplete emptying, hesitancy, intermittency  Began following recent lumbar back surgeries, ongoing radiculopathies  Hx of solitary Left kidney   RBUS (09/23/24) - solitary Left kidney, unremarkable US          [name], MD  [facility] [address] [place], [place] [address] [phone]

## 2024-11-16 NOTE — Progress Notes (Deleted)
   11/19/2024 12:26 PM   Karen Hunt [birth year] [id number]  Reason for visit: Follow up RBUS   HPI: 33 y.o. 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   Constitutional:  Alert and oriented, No acute distress.  Laboratory Data: N/A  Pertinent Imaging: I have personally viewed and interpreted the RBUS (09/23/24) - solitary Left kidney, unremarkable US   IMPRESSION: 1. Prior right nephrectomy. 2. Compensatory hypertrophy of the left kidney. No hydronephrosis. 3. Prominent postvoid residual of 30 cc.    Assessment & Plan:    Dysfunctional voiding of urine Assessment & Plan: Subacute dysfunctional voiding Subjective incomplete emptying, hesitancy, intermittency  Began following recent lumbar back surgeries, ongoing radiculopathies  Hx of solitary Left kidney   RBUS (09/23/24) - solitary Left kidney, unremarkable US           Karen JONELLE Skye, MD  [facility] [address], [address] [place], [place] [address] [phone]

## 2024-11-16 NOTE — Assessment & Plan Note (Deleted)
 Subacute dysfunctional voiding Subjective incomplete emptying, hesitancy, intermittency  Began following recent lumbar back surgeries, ongoing radiculopathies  Hx of solitary Left kidney   RBUS (09/23/24) - solitary Left kidney, unremarkable US 

## 2024-11-17 ENCOUNTER — Ambulatory Visit: Admitting: Urology

## 2024-11-17 DIAGNOSIS — N398 Other specified disorders of urinary system: Secondary | ICD-10-CM

## 2024-11-19 ENCOUNTER — Ambulatory Visit: Admitting: Urology

## 2024-11-19 DIAGNOSIS — N398 Other specified disorders of urinary system: Secondary | ICD-10-CM

## 2024-11-30 ENCOUNTER — Ambulatory Visit: Admitting: Nurse Practitioner

## 2024-12-19 ENCOUNTER — Encounter: Payer: Self-pay | Admitting: Neurosurgery

## 2024-12-19 ENCOUNTER — Encounter: Payer: Self-pay | Admitting: Student in an Organized Health Care Education/Training Program

## 2025-01-13 ENCOUNTER — Ambulatory Visit: Admitting: Neurology

## 2025-01-13 DIAGNOSIS — R202 Paresthesia of skin: Secondary | ICD-10-CM

## 2025-01-13 NOTE — Procedures (Signed)
" °  [facility]  [address] [place], [address]  [place], [place] [address] Tel: [phone] Fax: [phone] Test Date:  01/13/2025  Patient: Karen Hunt DOB: 01/29/1991 Physician: Tonita Blanch, DO  Sex: Female Height: 5' 9 Ref Phys: Penne Sharps, MD  ID#: [id number]   Technician:    History: This is a 34 year old female with history of L5-S1 discectomy referred for evaluation of left leg pain.  NCV & EMG Findings: Extensive electrodiagnostic testing of the left lower extremity shows:  Left sural and superficial peroneal sensory response is within normal limits. Left peroneal and tibial motor responses are within normal limits. Left tibial H reflex study is mildly prolonged.  In isolation, these findings are of uncertain clinical significance. There is no evidence of active or chronic motor axonal loss changes affecting any of the tested muscles.  Motor unit configuration and recruitment pattern is within normal limits.  Impression: This is a normal study of the left lower extremity.  In particular, there is no evidence of a lumbosacral radiculopathy or large fiber sensorimotor polyneuropathy.   ___________________________ Tonita Blanch, DO    Nerve Conduction Studies   Stim Site NR Peak (ms) Norm Peak (ms) O-P Amp (V) Norm O-P Amp  Left Sup Peroneal Anti Sensory (Ant Lat Mall)  32 C  12 cm    2.5 <4.5 10.3 >5  Left Sural Anti Sensory (Lat Mall)  32 C  Calf    2.9 <4.5 17.2 >5     Stim Site NR Onset (ms) Norm Onset (ms) O-P Amp (mV) Norm O-P Amp Site1 Site2 Delta-0 (ms) Dist (cm) Vel (m/s) Norm Vel (m/s)  Left Peroneal Motor (Ext Dig Brev)  32 C  Ankle    3.8 <5.5 5.0 >3 B Fib Ankle 7.7 38.0 49 >40  B Fib    11.5  5.0  Poplt B Fib 1.4 8.0 57 >40  Poplt    12.9  4.7         Left Tibial Motor (Abd Hall Brev)  32 C  Ankle    2.5 <6.0 10.2 >8 Knee Ankle 9.7 44.0 45 >40  Knee    12.2  8.7          Electromyography   Side Muscle Ins.Act Fibs Fasc Recrt Amp  Dur Poly Activation Comment  Left AntTibialis Nml Nml Nml Nml Nml Nml Nml Nml N/A  Left Gastroc Nml Nml Nml Nml Nml Nml Nml Nml N/A  Left Flex Dig Long Nml Nml Nml Nml Nml Nml Nml Nml N/A  Left BicepsFemS Nml Nml Nml Nml Nml Nml Nml Nml N/A  Left RectFemoris Nml Nml Nml Nml Nml Nml Nml Nml N/A  Left GluteusMed Nml Nml Nml Nml Nml Nml Nml Nml N/A      Waveforms:             "

## 2025-01-17 ENCOUNTER — Ambulatory Visit: Payer: Self-pay | Admitting: Neurosurgery

## 2025-01-23 NOTE — Telephone Encounter (Signed)
 Pt reached out on 01/17/2025 at 7:38pm:    Hey! Just a heads up- depending on if we get snow (fingers crossed ??), it's a 50 minute drive to get to the clinic for me. And its looking like we're gonna get a decent amount for once. If that happens, I'll have to reschedule.    I replied to pt at 8:08pm:    Karen Hunt! Thanks a lot for letting me know and thats totally understandable your safety comes first     I then reached out to pt on 01/23/026 at 8:12pm:    Karen Hunt, I dont know if the school has reached out yet but they are closing clinics for Monday 26th   Pt replied at 8:27pm:    No worries! I was about to text you. I honestly didn't think it would be. Just let me know when we can reschedule when you get a chance.     I then reached out to pt on 01/22/2025 at 11:24am:    Does February 5th at 9am works? I also have February 12th at 9am or 1:30pm    Pt replied at 11:28am:   I can do the 5th at 16

## 2025-01-27 ENCOUNTER — Ambulatory Visit
Attending: Student in an Organized Health Care Education/Training Program | Admitting: Student in an Organized Health Care Education/Training Program

## 2025-01-27 ENCOUNTER — Encounter: Payer: Self-pay | Admitting: Student in an Organized Health Care Education/Training Program

## 2025-01-27 VITALS — BP 134/92 | HR 81 | Temp 98.6°F | Resp 16 | Ht 69.0 in | Wt 350.0 lb

## 2025-01-27 DIAGNOSIS — M5134 Other intervertebral disc degeneration, thoracic region: Secondary | ICD-10-CM | POA: Insufficient documentation

## 2025-01-27 DIAGNOSIS — G894 Chronic pain syndrome: Secondary | ICD-10-CM | POA: Diagnosis present

## 2025-01-27 DIAGNOSIS — M961 Postlaminectomy syndrome, not elsewhere classified: Secondary | ICD-10-CM | POA: Diagnosis present

## 2025-01-27 MED ORDER — PREGABALIN 50 MG PO CAPS: 50.0000 mg | ORAL_CAPSULE | Freq: Three times a day (TID) | ORAL | 2 refills | Status: AC

## 2025-01-27 NOTE — Progress Notes (Signed)
 PROVIDER NOTE: Interpretation of information contained herein should be left to medically-trained personnel. Specific patient instructions are provided elsewhere under Patient Instructions section of medical record. This document was created in part using AI and STT-dictation technology, any transcriptional errors that may result from this process are unintentional.  Patient: Karen Hunt  Service: E/M   PCP: Claudene Elsie NOVAK, MD  DOB: 1991-09-15  DOS: 01/27/2025  Provider: Wallie Sherry, MD  MRN: [id number]  Delivery: Face-to-face  Specialty: Interventional Pain Management  Type: Established Patient  Setting: Ambulatory outpatient facility  Specialty designation: 09  Referring Prov.: Claudene Elsie NOVAK, MD  Location: Outpatient office facility       History of present illness (HPI) [name], a 34 y.o. year old female, is here today because of her Lumbar post-laminectomy syndrome [M96.1]. [name] primary complain today is Back Pain (Lower)  Pertinent problems: Karen Hunt has Lumbar radiculopathy; Lumbar disk herniation, left, L5/S1, with weakness; Failed back surgical syndrome; and Chronic pain syndrome on their pertinent problem list.  Pain Assessment: Severity of Chronic pain is reported as a 8 /10. Location: Back Left, Lower/left buttocks down side of left leg to foot, right radiates to knee. Onset: More than a month ago. Quality: Aching, Radiating, Restless, Discomfort, Shooting, Sore, Tiring, Stabbing, Burning, Constant. Timing: Constant. Modifying factor(s): smoke MJ. Vitals:  height is 5' 9 (1.753 m) and weight is 350 lb (158.8 kg) (abnormal). Her temperature is 98.6 F (37 C). Her blood pressure is 134/92 (abnormal) and her pulse is 81. Her respiration is 16 and oxygen saturation is 97%.  BMI: Estimated body mass index is 51.69 kg/m as calculated from the following:   Height as of this encounter: 5' 9 (1.753 m).   Weight as of this encounter: 350 lb (158.8 kg).  Last  encounter: Visit date not found. Last procedure: 11/03/2024.  Reason for encounter:   Discussed the use of AI scribe software for clinical note transcription with the patient, who gave verbal consent to proceed.  History of Present Illness   Karen Hunt is a 34 year old female with chronic back pain who presents for evaluation of persistent pain and consideration of a spinal cord stimulator.  She experiences chronic back pain primarily located in the left buttock, radiating downwards from the very low back. A previous injection on November 5th was not helpful and resulted in a flare-up of her pain. An EMG study was conducted and returned normal. She has a history of spine surgery.  The severity of her pain has rendered her unable to work since July, limiting her ability to sit, stand, or lie down for more than thirty minutes. She also reports difficulty with daily activities such as grocery shopping.  She has tried various medications in the past, including opioid medications , which were ineffective. She has not tried Lyrica  before and is open to trying it for pain management.  She has been attempting to lose weight by stopping sodas, chips, snacks, and smoking. She switched to Cymbalta to help with depression, but notes that her depression is unlikely to improve until her pain is managed.  She has been diagnosed with PTSD, depression, and anxiety. She is exploring the possibility of having ANCA vasculitis syndrome.       ROS  Constitutional: Denies any fever or chills Gastrointestinal: No reported hemesis, hematochezia, vomiting, or acute GI distress Musculoskeletal: Low back and radiating left leg pain Neurological: No reported episodes of acute onset apraxia, aphasia, dysarthria,  agnosia, amnesia, paralysis, loss of coordination, or loss of consciousness  Medication Review  Erenumab-aooe, Rimegepant Sulfate, acetaminophen , albuterol , ascorbic acid, atorvastatin,  budesonide-formoterol, clobetasol ointment, cyanocobalamin, famotidine , hydrOXYzine, ketoconazole, levonorgestrel, magnesium oxide, metFORMIN, metroNIDAZOLE , oxyCODONE , pregabalin , promethazine , and senna  History Review  Allergy: Karen Hunt is allergic to ciprofloxacin . Drug: Karen Hunt  reports current drug use. Drug: Marijuana. Alcohol:  reports current alcohol use. Tobacco:  reports that she has been smoking cigarettes. She has never used smokeless tobacco. Social: Karen Hunt  reports that she has been smoking cigarettes. She has never used smokeless tobacco. She reports current alcohol use. She reports current drug use. Drug: Marijuana. 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 Surgical: Karen Hunt  has a past surgical history that includes Kidney surgery (04/1991); Foot surgery (Left, 2010); c section; Myringotomy (1996); Wisdom tooth extraction; Myringotomy with tube placement (Bilateral, 05/14/2023); Dilation and curettage of uterus; Lumbar laminectomy/decompression microdiscectomy (Left, 04/29/2024); and Lumbar laminectomy/decompression microdiscectomy (Left, 07/08/2024). Family: family history is not on file.  Laboratory Chemistry Profile   Renal Lab Results  Component Value Date   BUN 10 07/07/2024   CREATININE 0.91 07/07/2024   GFRAA >60 08/25/2020   GFRNONAA >60 07/07/2024    Hepatic Lab Results  Component Value Date   AST 16 03/30/2024   ALT 18 03/30/2024   ALBUMIN 3.6 03/30/2024   ALKPHOS 35 (L)  03/30/2024   LIPASE 20 02/08/2016    Electrolytes Lab Results  Component Value Date   NA 140 07/07/2024   K 3.6 07/07/2024   CL 109 07/07/2024   CALCIUM 9.1 07/07/2024   MG 2.3 06/09/2022    Bone No results found for: VD25OH, VD125OH2TOT, CI6874NY7, CI7874NY7, 25OHVITD1, 25OHVITD2, 25OHVITD3, TESTOFREE, TESTOSTERONE  Inflammation (CRP: Acute Phase) (ESR: Chronic Phase) No results found for: CRP, ESRSEDRATE, LATICACIDVEN       Note: Above Lab results reviewed.  Recent Imaging Review  NCV with EMG(electromyography) Tobie Tonita POUR, DO     01/13/2025  2:04 PM  [facility]  [address] [place], [address]  [place], [place] [address] Tel: [phone] Fax: [phone] Test Date:  01/13/2025  Patient: Kilie Rund DOB: 03/01/1991 Physician: Tonita Tobie,  DO  Sex: Female Height: 5' 9 Ref Phys: Penne Sharps, MD  ID#: [id number]   Technician:    History: This is a 34 year old female with history of L5-S1 discectomy  referred for evaluation of left leg pain.  NCV & EMG Findings: Extensive electrodiagnostic testing of the left lower extremity  shows:  Left sural and superficial peroneal sensory response is within  normal limits. Left peroneal and tibial motor responses are within normal  limits. Left tibial H reflex study is mildly prolonged.  In isolation,  these findings are of uncertain clinical significance. There is no evidence of active or chronic motor axonal loss  changes affecting any of the tested muscles.  Motor unit  configuration and recruitment pattern is within normal limits.  Impression: This is a normal study of the left lower extremity.  In  particular, there is no evidence of a lumbosacral radiculopathy  or large fiber sensorimotor polyneuropathy.  ___________________________ Tonita Tobie, DO  Nerve Conduction Studies   Stim Site  NR Peak (ms) Norm Peak (ms) O-P Amp (V) Norm O-P Amp  Left Sup Peroneal Anti  Sensory (Ant Lat Mall)  32 C  12 cm    2.5 <4.5 10.3 >5  Left Sural Anti Sensory (Lat Mall)  32 C  Calf    2.9 <4.5 17.2 >5    Stim Site NR Onset (ms) Norm Onset (ms) O-P Amp (mV) Norm O-P  Amp Site1 Site2 Delta-0 (ms) Dist (cm) Vel (m/s) Norm Vel (m/s)  Left Peroneal Motor (Ext Dig Brev)  32 C  Ankle    3.8 <5.5 5.0 >3 B Fib Ankle 7.7 38.0 49 >40  B Fib    11.5  5.0  Poplt B Fib 1.4 8.0 57 >40  Poplt    12.9  4.7         Left Tibial Motor (Abd Hall Brev)  32 C  Ankle    2.5 <6.0 10.2 >8 Knee Ankle 9.7 44.0 45 >40  Knee    12.2  8.7          Electromyography   Side Muscle Ins.Act Fibs Fasc Recrt Amp Dur Poly Activation  Comment  Left AntTibialis Nml Nml Nml Nml Nml Nml Nml Nml N/A  Left Gastroc Nml Nml Nml Nml Nml Nml Nml Nml N/A  Left Flex Dig Long Nml Nml Nml Nml Nml Nml Nml Nml N/A  Left BicepsFemS Nml Nml Nml Nml Nml Nml Nml Nml N/A  Left RectFemoris Nml Nml Nml Nml Nml Nml Nml Nml N/A  Left GluteusMed Nml Nml Nml Nml Nml Nml Nml Nml N/A   Waveforms:              CLINICAL DATA:  Lumbar radiculopathy. Symptoms persist with greater than 6 weeks of treatment. Evaluate for scar versus new disc herniation.   EXAM: MRI LUMBAR SPINE WITHOUT AND WITH CONTRAST   TECHNIQUE: Multiplanar and multiecho pulse sequences of the lumbar spine were obtained without and with intravenous contrast.   CONTRAST:  10 cc Vueway    COMPARISON:  MRI 08/23/2024.  06/27/2024.  04/03/2024.   FINDINGS: Segmentation: 5 lumbar type vertebral bodies as numbered previously.   Alignment:  Normal   Vertebrae: Minimal endplate changes at L5-S1 related to chronic degenerative disc disease.   Conus medullaris and cauda equina: Conus extends to the L1 level. Conus and cauda equina appear normal.   Paraspinal and other soft tissues: Negative   Disc levels:   T12-L1: Disc bulge.  No compressive stenosis.   L1-2 through L3-4: Normal   L4-5: Minimal disc bulge. Minimal facet and  ligamentous hypertrophy. No compressive stenosis.   L5-S1: Previous left laminotomy and discectomy. There is a small recurrent left posterolateral disc herniation, smaller than the initial lesion seen in April. Small amount of regional postsurgical enhancement additionally present. It is possible this could affect the left S1 nerve. No right-sided compressive abnormality. L5 nerve exits without compression.   IMPRESSION: L5-S1: Previous left laminotomy and discectomy. Small recurrent left posterolateral disc herniation, smaller than the initial lesion seen in April. Small amount of regional postsurgical enhancement additionally present. It is possible this could affect the left S1 nerve. No right-sided compressive abnormality.     Electronically Signed   By: Oneil Officer M.D.   On: 09/21/2024 15:33   Note: Reviewed        Physical Exam  Vitals: BP (!) 134/92   Pulse 81   Temp 98.6 F (37 C)   Resp 16   Ht 5' 9 (  1.753 m)   Wt (!) 350 lb (158.8 kg)   SpO2 97%   BMI 51.69 kg/m  BMI: Estimated body mass index is 51.69 kg/m as calculated from the following:   Height as of this encounter: 5' 9 (1.753 m).   Weight as of this encounter: 350 lb (158.8 kg). Ideal: Ideal body weight: 66.2 kg (145 lb 15.1 oz) Adjusted ideal body weight: 103.2 kg (227 lb 9.1 oz) General appearance: Well nourished, well developed, and well hydrated. In no apparent acute distress Mental status: Alert, oriented x 3 (person, place, & time)       Respiratory: No evidence of acute respiratory distress Eyes: PERLA  Lumbar Spine Area Exam  Skin & Axial Inspection: Well healed scar from previous spine surgery detected Alignment: Symmetrical Functional ROM: Pain restricted ROM affecting primarily the left Stability: No instability detected Muscle Tone/Strength: Functionally intact. No obvious neuro-muscular anomalies detected. Sensory (Neurological): Dermatomal pain pattern Palpation: No palpable  anomalies        Gait & Posture Assessment  Ambulation: Unassisted Gait: Relatively normal for age and body habitus Posture: WNL  Lower Extremity Exam    Side: Right lower extremity  Side: Left lower extremity  Stability: No instability observed          Stability: No instability observed          Skin & Extremity Inspection: Skin color, temperature, and hair growth are WNL. No peripheral edema or cyanosis. No masses, redness, swelling, asymmetry, or associated skin lesions. No contractures.  Skin & Extremity Inspection: Skin color, temperature, and hair growth are WNL. No peripheral edema or cyanosis. No masses, redness, swelling, asymmetry, or associated skin lesions. No contractures.  Functional ROM: Unrestricted ROM                  Functional ROM: Pain restricted ROM for hip and knee joints          Muscle Tone/Strength: Functionally intact. No obvious neuro-muscular anomalies detected.  Muscle Tone/Strength: Functionally intact. No obvious neuro-muscular anomalies detected.  Sensory (Neurological): Unimpaired        Sensory (Neurological): Neurogenic pain pattern        DTR: Patellar: deferred today Achilles: deferred today Plantar: deferred today  DTR: Patellar: deferred today Achilles: deferred today Plantar: deferred today  Palpation: No palpable anomalies  Palpation: No palpable anomalies   Assessment   Diagnosis  1. Lumbar post-laminectomy syndrome   2. Failed back surgical syndrome   3. Chronic pain syndrome   4. Other intervertebral disc degeneration, thoracic region      Updated Problems: Problem  Failed Back Surgical Syndrome  Chronic Pain Syndrome  Other Intervertebral Disc Degeneration, Thoracic Region    Plan of Care   The patient presents with chronic, intractable pain involving both mechanical low back pain and neurogenic components radiating into the LEFT lower leg. Given the patients history of failed back surgery syndrome and radiculopathy, and  limited response to conservative and interventional therapies, a spinal cord stimulator (SCS) trial is being considered. While SCS is typically more effective for neuropathic and appendicular pain, we discussed that some patients may also experience relief in axial low back and hip pain. The potential benefits and risks of spinal cord stimulation were thoroughly reviewed.  The proposed plan includes a percutaneous spinal cord stimulator trial. The patient was informed that this will involve temporary placement of epidural leads connected to an external pulse generator, which will be used over a 7-day trial period. We discussed  the possibility of a mid-trial in-office visit to adjust settings and optimize programming in order to give the patient the best chance of success. The patient will receive daily support from the device representative throughout the trial.  We reviewed the benefits of SCS, which include potential substantial pain relief, reduction in the use of oral pain medications including opioids, and long-term programmable therapy that can reduce reliance on repeated injections and other pain interventions. Risks were also discussed in detail and include potential surgical complications such as infection, bleeding, CSF leak, lead migration or fracture, hardware malfunction, and the possibility of either no pain relief or worsening of symptoms. The patient was advised that spinal cord stimulation is not a guaranteed solution or a magic bullet, but rather a potentially valuable therapy in appropriately selected cases.  As part of standard protocol, the patient will require a comprehensive psychosocial and behavioral evaluation prior to the trial. A referral was placed to [facility], and the patient was instructed to have the results faxed to our clinic prior to scheduling the procedure. We had a thorough and detailed discussion reviewing the rationale, alternatives, risks, and expected outcomes. The  patient stated that all questions were answered to their satisfaction, demonstrated appropriate understanding, and expressed readiness to proceed. There were no barriers to understanding the treatment plan, and the explanation was well received. The patient is eager to move forward with the spinal cord stimulator trial once the necessary evaluation is complete.  I also recommend patient's start Lyrica  as below.  She has tried gabapentin  in the past without much benefit.  We also discussed the importance of weight loss.  I commended her on stopping smoking.  BMI should be less than 50 for SCS trial.  I encouraged her to discuss oral Wegovy with her PCP   Karen Hunt has a current medication list which includes the following long-term medication(s): pregabalin , atorvastatin, levonorgestrel, metformin, promethazine , and symbicort.  Pharmacotherapy (Medications Ordered): Meds ordered this encounter  Medications   pregabalin  (LYRICA ) 50 MG capsule    Sig: Take 1 capsule (50 mg total) by mouth 3 (three) times daily.    Dispense:  90 capsule    Refill:  2   Orders:  Orders Placed This Encounter  Procedures   MR THORACIC SPINE WO CONTRAST    Patient presents with axial pain with possible radicular component. Please assist us  in identifying specific level(s) and laterality of any additional findings such as: 1. Facet (Zygapophyseal) joint DJD (Hypertrophy, space narrowing, subchondral sclerosis, and/or osteophyte formation) 2. DDD and/or IVDD (Loss of disc height, desiccation, gas patterns, osteophytes, endplate sclerosis, or Black disc disease) 3. Pars defects 4. Spondylolisthesis, spondylosis, and/or spondyloarthropathies (include Degree/Grade of displacement in mm) (stability) 5. Vertebral body Fractures (acute/chronic) (state percentage of collapse) 6. Demineralization (osteopenia/osteoporotic) 7. Bone pathology 8. Foraminal narrowing  9. Surgical changes 10. Central, Lateral  Recess, and/or Foraminal Stenosis (include AP diameter of stenosis in mm) 11. Surgical changes (hardware type, status, and presence of fibrosis) 12. Modic Type Changes (MRI only) 13. IVDD (Disc bulge, protrusion, herniation, extrusion) (Level, laterality, extent)    Standing Status:   Future    Expiration Date:   04/27/2025    Scheduling Instructions:     Please make sure that the patient understands that this needs to be done as soon as possible. Never have the patient do the imaging just before the next appointment. Inform patient that having the imaging done within the [facility] will expedite the availability of  the results and will provide      imaging availability to the requesting physician. In addition inform the patient that the imaging order has an expiration date and will not be renewed if not done within the active period.    What is the patient's sedation requirement?:   No Sedation    Does the patient have a pacemaker or implanted devices?:   No    Preferred imaging location?:   [facility] (table limit-350lbs)    Call Results- Best Contact Number?:   [phone] [facility]    Radiology Contrast Protocol - do NOT remove file path:   [email or web address]   Ambulatory referral to Psychology    Referral Priority:   Routine    Referral Type:   Psychiatric    Referral Reason:   Specialty Services Required    Requested Specialty:   Psychology    Number of Visits Requested:   1     Left S1 TF ESI 11/03/24- not helpful   Return in about 4 weeks (around 02/24/2025) for VV 4 weeks.    Recent Visits Date Type Provider Dept  11/10/24 Office Visit Patel, Seema K, NP [facility]  11/03/24 Procedure visit Marcelino Nurse, MD [facility]  Showing recent visits within past 90 days and meeting all other requirements Today's Visits Date Type Provider Dept  01/27/25 Office Visit  Marcelino Nurse, MD [facility]  Showing today's visits and meeting all other requirements Future Appointments No visits were found meeting these conditions. Showing future appointments within next 90 days and meeting all other requirements  I discussed the assessment and treatment plan with the patient. The patient was provided an opportunity to ask questions and all were answered. The patient agreed with the plan and demonstrated an understanding of the instructions.  Patient advised to call back or seek an in-person evaluation if the symptoms or condition worsens.  I personally spent a total of 30 minutes in the care of the patient today including preparing to see the patient, getting/reviewing separately obtained history, performing a medically appropriate exam/evaluation, counseling and educating, placing orders, and documenting clinical information in the EHR.   Note by: Nurse Marcelino, MD (TTS and AI technology used. I apologize for any typographical errors that were not detected and corrected.) Date: 01/27/2025; Time: 10:44 AM

## 2025-01-27 NOTE — Progress Notes (Signed)
 Safety precautions to be maintained throughout the outpatient stay will include: orient to surroundings, keep bed in low position, maintain call bell within reach at all times, provide assistance with transfer out of bed and ambulation.

## 2025-01-27 NOTE — Patient Instructions (Addendum)
 [facility] brochure Thoracic MRI [employer] scrub

## 2025-02-03 ENCOUNTER — Inpatient Hospital Stay: Admission: RE | Admit: 2025-02-03 | Discharge: 2025-02-03

## 2025-02-03 ENCOUNTER — Ambulatory Visit (INDEPENDENT_AMBULATORY_CARE_PROVIDER_SITE_OTHER)

## 2025-02-03 ENCOUNTER — Ambulatory Visit (HOSPITAL_COMMUNITY)

## 2025-02-03 ENCOUNTER — Telehealth: Payer: Self-pay | Admitting: Emergency Medicine

## 2025-02-03 ENCOUNTER — Ambulatory Visit (HOSPITAL_COMMUNITY): Payer: Self-pay

## 2025-02-03 VITALS — BP 141/93 | HR 77 | Temp 97.9°F | Resp 17

## 2025-02-03 DIAGNOSIS — M533 Sacrococcygeal disorders, not elsewhere classified: Secondary | ICD-10-CM

## 2025-02-03 DIAGNOSIS — M545 Low back pain, unspecified: Secondary | ICD-10-CM

## 2025-02-03 MED ORDER — OXYCODONE HCL 5 MG PO CAPS: 5.0000 mg | ORAL_CAPSULE | Freq: Four times a day (QID) | ORAL | 0 refills | Status: AC | PRN

## 2025-02-03 MED ORDER — KETOROLAC TROMETHAMINE 30 MG/ML IJ SOLN
30.0000 mg | Freq: Once | INTRAMUSCULAR | Status: AC
  Administered 2025-02-03: 30 mg via INTRAMUSCULAR

## 2025-02-03 MED ORDER — PREDNISONE 10 MG (21) PO TBPK: ORAL_TABLET | Freq: Every day | ORAL | 0 refills | Status: AC

## 2025-02-03 MED ORDER — CYCLOBENZAPRINE HCL 10 MG PO TABS: 10.0000 mg | ORAL_TABLET | Freq: Two times a day (BID) | ORAL | 0 refills | Status: AC | PRN

## 2025-02-03 NOTE — Telephone Encounter (Signed)
 Reported x-ray results to patient, 2 patient identifiers used, no change in treatment plan

## 2025-02-03 NOTE — Discharge Instructions (Addendum)
 Evaluated for your back and sacral pain  X-rays are pending and you will be notified of results by telephone  You have been given injection of Toradol  to help reduce inflammation and pain and ideally will start to see some improvement within the next 30 minutes to an hour  Starting tomorrow take oral prednisone  to continue the process above, avoid ibuprofen  but may use Tylenol  or any topical medicines  May use muscle relaxers as needed for additional comfort but be mindful this can make you feel sleepy may use twice daily  For severe pain you may use oxycodone  taking every 6 hours as needed, please be mindful this can also make you feel drowsy  May apply ice and heat over the affected area in 10 to 15-minute intervals  Cushion the body with pillows behind the back underneath the buttock between the knees whenever sitting and lying  You may continue activity for what is tolerable  If there is any injury to the spine on your x-ray please notify your neurosurgeon for further evaluation

## 2025-02-03 NOTE — ED Provider Notes (Signed)
 " [name]    CSN: [id number] Arrival date & time: 02/03/25  1400      History   Chief Complaint Chief Complaint  Patient presents with   Fall    Had 2 micro dissectomies last year. Getting everything together for a spinal neural stimulator for pain. Slipped on the ice and fell hard. Lots of back and rear pain. - Entered by patient    HPI Karen Hunt is a 34 y.o. female.   Patient presents for evaluation of worsening low back pain and sacral pain beginning 1 day ago after fall.  Slipped on ice causing her to land on her tailbone and her back.  Pain has been constant rating a 10 out of 10, described as severe, experiencing tingling to the left lower extremity.  Tingling typically intermittent but has become constant since fall.  Unable to sit directly onto the buttocks due to pain and has been shifting weight on the right side when sitting.  Has attempted use of Tylenol , ice and heat without improvement.  History of herniated disc, lumbar laminectomy, fibromyalgia, lumbar pain with radiculopathy   Past Medical History:  Diagnosis Date   Anxiety    Arthritis    osteoarthritis   Chronic kidney disease    1 kidney was removed at 2 mos of age due to polycystic fibrosis on   Complication of anesthesia    slow to wake up and coughing a lot after tubes placed in 2024   Depression    Fibromyalgia    GERD (gastroesophageal reflux disease)    Hypertension    Increased frequency of urination 07/03/2021   Last Assessment & Plan:   Formatting of this note might be different from the original.  For about 2 months w/ on/off pain associated. Check U/A, A1c, and BMP.   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   Chronic pain syndrome 01/27/2025   Other intervertebral disc degeneration, thoracic region 01/27/2025   Dysfunctional voiding of urine 09/15/2024   Recurrent herniation of lumbar disc 07/01/2024   Lumbar radiculopathy 04/19/2024   Lumbar disk herniation, left, L5/S1, with weakness 04/19/2024   Acute bilateral low back pain 10/03/2021   Gastroesophageal reflux disease 07/03/2021   Increased frequency of urination 07/03/2021   Class 3 severe obesity due to excess calories with body mass index (BMI) of 45.0 to 49.9 in adult [facility]) 12/28/2019   History of ischemic stroke 11/02/2019   Seizure disorder as sequela of cerebrovascular accident (HCC) 11/02/2019   Tobacco use disorder 10/22/2019   Nonintractable episodic headache 10/19/2019   Healthcare maintenance 08/17/2018   Recurrent major depressive disorder, in partial remission 08/17/2018   Recurrent acute suppurative otitis media of right ear without spontaneous rupture of tympanic membrane 10/03/2015   Hypertension 09/22/2011    Past Surgical History:  Procedure Laterality Date   c section     DILATION AND CURETTAGE OF UTERUS     FOOT SURGERY Left 2010   KIDNEY SURGERY  04/1991  LUMBAR LAMINECTOMY/DECOMPRESSION MICRODISCECTOMY Left 04/29/2024   Procedure: LUMBAR LAMINECTOMY/DECOMPRESSION MICRODISCECTOMY 1 LEVEL;  Surgeon: Claudene Penne ORN, MD;  Location: [facility] ORS;  Service: Neurosurgery;  Laterality: Left;  LEFT L5-S1 MICRODISCECTOMY WITH POSSIBLE CONVERTION TO OPEN   LUMBAR LAMINECTOMY/DECOMPRESSION MICRODISCECTOMY Left 07/08/2024   Procedure: LUMBAR LAMINECTOMY/DECOMPRESSION MICRODISCECTOMY 1 LEVEL;  Surgeon: Claudene Penne ORN, MD;  Location: [facility] OR[facility];  Service: Neurosurgery;  Laterality: Left;  REDO LEFT L5-S1 DISCECTOMY   MYRINGOTOMY  1996   MYRINGOTOMY WITH TUBE PLACEMENT Bilateral 05/14/2023    Procedure: MYRINGOTOMY WITH TUBE PLACEMENT;  Surgeon: Blair Mt, MD;  Location: [facility] ORS;  Service: ENT;  Laterality: Bilateral;   WISDOM TOOTH EXTRACTION     at the age of 43    OB History     Gravida  2   Para      Term      Preterm      AB  1   Living  1      SAB  1   IAB      Ectopic      Multiple      Live Births               Home Medications    Prior to Admission medications  Medication Sig Start Date End Date Taking? Authorizing Provider  acetaminophen  (TYLENOL ) 500 MG tablet Take 2 tablets (1,000 mg total) by mouth every 6 (six) hours as needed. 07/08/24   Ulis Bottcher, PA-C  AIMOVIG 140 MG/ML SOAJ Inject 140 mg into the skin every 30 (thirty) days. 10/18/22   [provider]  ascorbic acid (VITAMIN C) 500 MG tablet Take 1,000 mg by mouth in the morning.    [provider]  atorvastatin (LIPITOR) 80 MG tablet Take 80 mg by mouth in the morning. 07/26/21   [provider]  clobetasol ointment (TEMOVATE) 0.05 % Apply 1 Application topically 2 (two) times daily as needed (irritation (boils)). 09/30/23   [provider]  cyanocobalamin (VITAMIN B12) 1000 MCG tablet Take 2,000 mcg by mouth in the morning.    [provider]  famotidine  (PEPCID ) 20 MG tablet Take 20 mg by mouth 2 (two) times daily. 09/19/22   [provider]  hydrOXYzine (ATARAX) 25 MG tablet Take 25 mg by mouth every 8 (eight) hours as needed for anxiety. 12/04/23   [provider]  ketoconazole (NIZORAL) 2 % cream Apply 1 Application topically daily as needed for irritation. 07/15/23   [provider]  ketoconazole (NIZORAL) 2 % shampoo Apply 1 Application topically 3 (three) times a week. 07/15/23   [provider]  levonorgestrel (MIRENA) 20 MCG/24HR IUD 1 each by Intrauterine route once.    [provider]  magnesium oxide (MAG-OX) 400 (240 Mg) MG tablet Take 800 mg by mouth in the morning.     [provider]  metFORMIN (GLUCOPHAGE) 500 MG tablet Take 500 mg by mouth 2 (two) times daily with a meal. 09/23/23 11/10/24  [provider]  metroNIDAZOLE  (METROGEL ) 0.75 % gel Apply 1 Application topically at bedtime. 03/04/24   [provider]  NURTEC 75 MG TBDP Take 75 mg by mouth daily as needed (migrines).    [provider]  oxyCODONE  (OXY IR/ROXICODONE ) 5 MG immediate release tablet Take 5 mg by mouth every 6 (six) hours as needed. 10/14/24   [provider]  pregabalin  (LYRICA ) 50 MG capsule Take 1 capsule (50 mg total) by mouth 3 (three) times daily. 01/27/25 04/27/25  Marcelino Nurse, MD  promethazine  (PHENERGAN ) 25 MG tablet Take 1 tablet (25 mg total) by mouth every 6 (six) hours as needed for up to 7 days for nausea or vomiting. 04/29/24 07/06/27  Claudene Penne ORN, MD  senna (SENOKOT) 8.6 MG TABS tablet Take 1 tablet (8.6 mg total) by mouth daily. 09/21/24   Ulis Bottcher, PA-C  SYMBICORT 160-4.5 MCG/ACT inhaler Inhale 2 puffs into the lungs 2 (two) times daily as needed (Asthma). 03/30/24   [provider]  VENTOLIN  HFA 108 (90 Base) MCG/ACT inhaler Inhale 2 puffs into the lungs every 6 (six) hours as needed for wheezing or shortness of breath. 01/09/24   [provider]    Family History No family history on file.  Social History Social History[1]   Allergies   Ciprofloxacin    Review of Systems Review of Systems   Physical Exam Triage Vital Signs ED Triage Vitals  Encounter Vitals Group     BP      Girls Systolic BP Percentile      Girls Diastolic BP Percentile      Boys Systolic BP Percentile      Boys Diastolic BP Percentile      Pulse      Resp      Temp      Temp src      SpO2      Weight      Height      Head Circumference      Peak Flow      Pain Score      Pain Loc      Pain Education      Exclude from Growth Chart    No data found.  Updated Vital Signs There were no vitals taken for this  visit.  Visual Acuity Right Eye Distance:   Left Eye Distance:   Bilateral Distance:    Right Eye Near:   Left Eye Near:    Bilateral Near:     Physical Exam Constitutional:      Appearance: Normal appearance.  Eyes:     Extraocular Movements: Extraocular movements intact.  Pulmonary:     Effort: Pulmonary effort is normal.  Musculoskeletal:     Comments: Spinal tenderness to the lower lumbar region and the sacrum, no ecchymosis or deformity noted, able to complete lateral turn and bending, pain elicited with right turn only, unable to sit erect due to pain, leaning towards the right side, unable to assess straight leg test  Neurological:     Mental Status: She is alert and oriented to person, place, and time. Mental status is at baseline.      UC Treatments / Results  Labs (all labs ordered are listed, but only abnormal results are displayed) Labs Reviewed - No data to display  EKG   Radiology No results found.  Procedures Procedures (including critical care time)  Medications Ordered in UC Medications - No data to display  Initial Impression / Assessment and Plan / UC Course  I have reviewed the triage vital signs and the nursing notes.  Pertinent labs & imaging results that were available during my care of the patient were reviewed by me and considered in my medical decision making (see chart for details).  Lumbar back pain, sacral pain  X-rays pending, will notify patient of results via telephone, Toradol  IM given and prescribed prednisone  Flexeril  and oxycodone  IR for home use, PDMP reviewed, low risk recommended supportive care through [facility] with activity as tolerable  if fracture noted patient to follow-up with her neurosurgeon for further management Final Clinical Impressions(s) / UC Diagnoses   Final diagnoses:  Lumbar back pain   Discharge Instructions   None    ED Prescriptions   None    PDMP not reviewed this encounter.     [1]  Social  History Tobacco Use   Smoking status: Every Day    Current packs/day: 1.00    Types: Cigarettes   Smokeless tobacco: Never  Vaping Use   Vaping status: Former  Substance Use Topics   Alcohol use: Yes    Comment: rare   Drug use: Yes    Types: Marijuana    Comment: daily     Teresa Shelba SAUNDERS, NP 02/03/25 1508  "

## 2025-02-03 NOTE — ED Notes (Signed)
 Patient triage by provider Teresa Shelba SAUNDERS, NP
